# Patient Record
Sex: Female | Born: 1955
Health system: Southern US, Community
[De-identification: ages and names within clinical notes are randomized; demographics above are authoritative.]

## PROBLEM LIST (undated history)

## (undated) DIAGNOSIS — C801 Malignant (primary) neoplasm, unspecified: Secondary | ICD-10-CM

## (undated) DIAGNOSIS — F329 Major depressive disorder, single episode, unspecified: Secondary | ICD-10-CM

## (undated) DIAGNOSIS — K52839 Microscopic colitis, unspecified: Secondary | ICD-10-CM

## (undated) DIAGNOSIS — K219 Gastro-esophageal reflux disease without esophagitis: Secondary | ICD-10-CM

## (undated) DIAGNOSIS — K52832 Lymphocytic colitis: Secondary | ICD-10-CM

## (undated) DIAGNOSIS — R51 Headache: Secondary | ICD-10-CM

## (undated) DIAGNOSIS — R0600 Dyspnea, unspecified: Secondary | ICD-10-CM

## (undated) DIAGNOSIS — F419 Anxiety disorder, unspecified: Secondary | ICD-10-CM

## (undated) DIAGNOSIS — K589 Irritable bowel syndrome without diarrhea: Secondary | ICD-10-CM

## (undated) DIAGNOSIS — F4310 Post-traumatic stress disorder, unspecified: Secondary | ICD-10-CM

## (undated) DIAGNOSIS — M1990 Unspecified osteoarthritis, unspecified site: Secondary | ICD-10-CM

## (undated) DIAGNOSIS — M899 Disorder of bone, unspecified: Secondary | ICD-10-CM

## (undated) DIAGNOSIS — Z9289 Personal history of other medical treatment: Secondary | ICD-10-CM

## (undated) DIAGNOSIS — F3289 Other specified depressive episodes: Secondary | ICD-10-CM

## (undated) DIAGNOSIS — M810 Age-related osteoporosis without current pathological fracture: Secondary | ICD-10-CM

## (undated) DIAGNOSIS — J449 Chronic obstructive pulmonary disease, unspecified: Secondary | ICD-10-CM

## (undated) DIAGNOSIS — M949 Disorder of cartilage, unspecified: Secondary | ICD-10-CM

## (undated) HISTORY — DX: Irritable bowel syndrome, unspecified: K58.9

## (undated) HISTORY — DX: Disorder of cartilage, unspecified: M94.9

## (undated) HISTORY — PX: TUBAL LIGATION: SHX77

## (undated) HISTORY — DX: Age-related osteoporosis without current pathological fracture: M81.0

## (undated) HISTORY — DX: Microscopic colitis, unspecified: K52.839

## (undated) HISTORY — PX: BUNIONECTOMY: SHX129

## (undated) HISTORY — DX: Disorder of bone, unspecified: M89.9

## (undated) HISTORY — DX: Headache: R51

## (undated) HISTORY — DX: Malignant (primary) neoplasm, unspecified: C80.1

## (undated) HISTORY — DX: Other specified depressive episodes: F32.89

## (undated) HISTORY — DX: Unspecified osteoarthritis, unspecified site: M19.90

## (undated) HISTORY — DX: Lymphocytic colitis: K52.832

## (undated) HISTORY — DX: Personal history of other medical treatment: Z92.89

## (undated) HISTORY — DX: Major depressive disorder, single episode, unspecified: F32.9

---
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
## 1983-04-13 HISTORY — PX: ABDOMINAL HYSTERECTOMY: SHX81

## 1985-04-12 HISTORY — PX: BREAST BIOPSY: SHX20

## 1985-04-12 LAB — HM MAMMOGRAPHY: HM Mammogram: NORMAL

## 1988-04-12 HISTORY — PX: FOOT SURGERY: SHX648

## 2000-07-26 ENCOUNTER — Ambulatory Visit (HOSPITAL_BASED_OUTPATIENT_CLINIC_OR_DEPARTMENT_OTHER): Admission: RE | Admit: 2000-07-26 | Discharge: 2000-07-26 | Payer: Self-pay | Admitting: Orthopaedic Surgery

## 2002-08-28 ENCOUNTER — Inpatient Hospital Stay (HOSPITAL_COMMUNITY): Admission: EM | Admit: 2002-08-28 | Discharge: 2002-08-29 | Payer: Self-pay | Admitting: Emergency Medicine

## 2002-08-29 ENCOUNTER — Encounter (INDEPENDENT_AMBULATORY_CARE_PROVIDER_SITE_OTHER): Payer: Self-pay | Admitting: Specialist

## 2007-04-13 DIAGNOSIS — C801 Malignant (primary) neoplasm, unspecified: Secondary | ICD-10-CM

## 2007-04-13 HISTORY — DX: Malignant (primary) neoplasm, unspecified: C80.1

## 2008-07-16 ENCOUNTER — Ambulatory Visit: Payer: Self-pay | Admitting: Internal Medicine

## 2008-07-16 ENCOUNTER — Encounter: Payer: Self-pay | Admitting: Internal Medicine

## 2008-07-16 DIAGNOSIS — IMO0002 Reserved for concepts with insufficient information to code with codable children: Secondary | ICD-10-CM | POA: Insufficient documentation

## 2008-07-16 DIAGNOSIS — C801 Malignant (primary) neoplasm, unspecified: Secondary | ICD-10-CM | POA: Insufficient documentation

## 2008-07-16 DIAGNOSIS — R519 Headache, unspecified: Secondary | ICD-10-CM | POA: Insufficient documentation

## 2008-07-16 DIAGNOSIS — F339 Major depressive disorder, recurrent, unspecified: Secondary | ICD-10-CM | POA: Insufficient documentation

## 2008-07-16 DIAGNOSIS — M129 Arthropathy, unspecified: Secondary | ICD-10-CM | POA: Insufficient documentation

## 2008-07-16 DIAGNOSIS — R51 Headache: Secondary | ICD-10-CM | POA: Insufficient documentation

## 2008-07-17 LAB — CONVERTED CEMR LAB
Basophils Absolute: 0.2 10*3/uL — ABNORMAL HIGH (ref 0.0–0.1)
Basophils Relative: 2.6 % (ref 0.0–3.0)
Cholesterol: 193 mg/dL (ref 0–200)
Eosinophils Absolute: 0.1 10*3/uL (ref 0.0–0.7)
Eosinophils Relative: 1.2 % (ref 0.0–5.0)
HCT: 43.4 % (ref 36.0–46.0)
HDL: 59.2 mg/dL (ref >39.00)
Hemoglobin: 15 g/dL (ref 12.0–15.0)
LDL Cholesterol: 122 mg/dL — ABNORMAL HIGH (ref 0–99)
Lymphocytes Relative: 34.9 % (ref 12.0–46.0)
Lymphs Abs: 2.6 10*3/uL (ref 0.7–4.0)
MCHC: 34.5 g/dL (ref 30.0–36.0)
MCV: 94.2 fL (ref 78.0–100.0)
Monocytes Absolute: 0.4 10*3/uL (ref 0.1–1.0)
Monocytes Relative: 5.7 % (ref 3.0–12.0)
Neutro Abs: 4.2 10*3/uL (ref 1.4–7.7)
Neutrophils Relative %: 55.6 % (ref 43.0–77.0)
Platelets: 315 10*3/uL (ref 150.0–400.0)
RBC: 4.61 M/uL (ref 3.87–5.11)
RDW: 12.9 % (ref 11.5–14.6)
TSH: 1.31 microintl units/mL (ref 0.35–5.50)
Total CHOL/HDL Ratio: 3
Triglycerides: 60 mg/dL (ref 0.0–149.0)
VLDL: 12 mg/dL (ref 0.0–40.0)
WBC: 7.5 10*3/uL (ref 4.5–10.5)

## 2008-07-22 ENCOUNTER — Ambulatory Visit: Payer: Self-pay | Admitting: Internal Medicine

## 2008-07-22 LAB — CONVERTED CEMR LAB
Basophils Absolute: 0.2 10*3/uL — ABNORMAL HIGH (ref 0.0–0.1)
Basophils Relative: 2.6 % (ref 0.0–3.0)
Cholesterol: 193 mg/dL (ref 0–200)
Eosinophils Absolute: 0.1 10*3/uL (ref 0.0–0.7)
Eosinophils Relative: 1.2 % (ref 0.0–5.0)
HCT: 43.4 % (ref 36.0–46.0)
HDL: 59.2 mg/dL (ref >39.00)
Hemoglobin: 15 g/dL (ref 12.0–15.0)
LDL Cholesterol: 122 mg/dL — ABNORMAL HIGH (ref 0–99)
Lymphocytes Relative: 34.9 % (ref 12.0–46.0)
Lymphs Abs: 2.6 10*3/uL (ref 0.7–4.0)
MCHC: 34.5 g/dL (ref 30.0–36.0)
MCV: 94.2 fL (ref 78.0–100.0)
Monocytes Absolute: 0.4 10*3/uL (ref 0.1–1.0)
Monocytes Relative: 5.7 % (ref 3.0–12.0)
Neutro Abs: 4.2 10*3/uL (ref 1.4–7.7)
Neutrophils Relative %: 55.6 % (ref 43.0–77.0)
Platelets: 315 10*3/uL (ref 150.0–400.0)
RBC: 4.61 M/uL (ref 3.87–5.11)
RDW: 12.9 % (ref 11.5–14.6)
TSH: 1.31 microintl units/mL (ref 0.35–5.50)
Total CHOL/HDL Ratio: 3
Triglycerides: 60 mg/dL (ref 0.0–149.0)
VLDL: 12 mg/dL (ref 0.0–40.0)
WBC: 7.5 10*3/uL (ref 4.5–10.5)

## 2008-07-24 ENCOUNTER — Telehealth: Payer: Self-pay | Admitting: Internal Medicine

## 2008-08-05 ENCOUNTER — Encounter: Payer: Self-pay | Admitting: Internal Medicine

## 2008-09-30 ENCOUNTER — Ambulatory Visit: Payer: Self-pay | Admitting: Internal Medicine

## 2008-09-30 DIAGNOSIS — E568 Deficiency of other vitamins: Secondary | ICD-10-CM | POA: Insufficient documentation

## 2008-09-30 DIAGNOSIS — M810 Age-related osteoporosis without current pathological fracture: Secondary | ICD-10-CM | POA: Insufficient documentation

## 2008-09-30 HISTORY — DX: Age-related osteoporosis without current pathological fracture: M81.0

## 2008-10-29 ENCOUNTER — Telehealth (INDEPENDENT_AMBULATORY_CARE_PROVIDER_SITE_OTHER): Payer: Self-pay | Admitting: *Deleted

## 2008-11-20 ENCOUNTER — Ambulatory Visit: Payer: Self-pay | Admitting: Internal Medicine

## 2009-01-08 ENCOUNTER — Ambulatory Visit: Payer: Self-pay | Admitting: Internal Medicine

## 2009-01-08 DIAGNOSIS — R197 Diarrhea, unspecified: Secondary | ICD-10-CM | POA: Insufficient documentation

## 2009-01-31 ENCOUNTER — Telehealth: Payer: Self-pay | Admitting: Internal Medicine

## 2009-02-03 ENCOUNTER — Telehealth: Payer: Self-pay | Admitting: Internal Medicine

## 2009-02-19 ENCOUNTER — Ambulatory Visit: Payer: Self-pay | Admitting: Internal Medicine

## 2009-02-21 ENCOUNTER — Ambulatory Visit: Payer: Self-pay | Admitting: Gastroenterology

## 2009-02-21 LAB — CONVERTED CEMR LAB
ALT: 17 units/L (ref 0–35)
AST: 25 units/L (ref 0–37)
Albumin: 4.2 g/dL (ref 3.5–5.2)
Alkaline Phosphatase: 101 units/L (ref 39–117)
BUN: 13 mg/dL (ref 6–23)
Basophils Absolute: 0.1 10*3/uL (ref 0.0–0.1)
Basophils Relative: 1.1 % (ref 0.0–3.0)
CO2: 29 meq/L (ref 19–32)
Calcium: 9.3 mg/dL (ref 8.4–10.5)
Chloride: 100 meq/L (ref 96–112)
Creatinine, Ser: 1 mg/dL (ref 0.4–1.2)
Eosinophils Absolute: 0.1 10*3/uL (ref 0.0–0.7)
Eosinophils Relative: 1.3 % (ref 0.0–5.0)
GFR calc non Af Amer: 61.56 mL/min (ref >60)
Glucose, Bld: 100 mg/dL — ABNORMAL HIGH (ref 70–99)
HCT: 43.8 % (ref 36.0–46.0)
Hemoglobin: 14.9 g/dL (ref 12.0–15.0)
IgA: 282 mg/dL (ref 68–378)
Lymphocytes Relative: 38.5 % (ref 12.0–46.0)
Lymphs Abs: 2.7 10*3/uL (ref 0.7–4.0)
MCHC: 34.1 g/dL (ref 30.0–36.0)
MCV: 96.7 fL (ref 78.0–100.0)
Monocytes Absolute: 0.5 10*3/uL (ref 0.1–1.0)
Monocytes Relative: 6.7 % (ref 3.0–12.0)
Neutro Abs: 3.5 10*3/uL (ref 1.4–7.7)
Neutrophils Relative %: 52.4 % (ref 43.0–77.0)
Platelets: 290 10*3/uL (ref 150.0–400.0)
Potassium: 4.4 meq/L (ref 3.5–5.1)
RBC: 4.53 M/uL (ref 3.87–5.11)
RDW: 13.3 % (ref 11.5–14.6)
Sed Rate: 14 mm/hr (ref 0–22)
Sodium: 140 meq/L (ref 135–145)
TSH: 1.95 microintl units/mL (ref 0.35–5.50)
Total Bilirubin: 0.7 mg/dL (ref 0.3–1.2)
Total Protein: 7.5 g/dL (ref 6.0–8.3)
WBC: 6.9 10*3/uL (ref 4.5–10.5)

## 2009-02-24 ENCOUNTER — Ambulatory Visit: Payer: Self-pay | Admitting: Gastroenterology

## 2009-02-24 ENCOUNTER — Encounter: Payer: Self-pay | Admitting: Gastroenterology

## 2009-02-24 LAB — HM COLONOSCOPY

## 2009-02-24 LAB — CONVERTED CEMR LAB: Tissue Transglutaminase Ab, IgA: 0.7 units (ref <7)

## 2009-02-27 ENCOUNTER — Telehealth (INDEPENDENT_AMBULATORY_CARE_PROVIDER_SITE_OTHER): Payer: Self-pay | Admitting: *Deleted

## 2009-04-18 ENCOUNTER — Ambulatory Visit: Payer: Self-pay | Admitting: Gastroenterology

## 2009-06-02 ENCOUNTER — Ambulatory Visit: Payer: Self-pay | Admitting: Gastroenterology

## 2009-08-05 ENCOUNTER — Ambulatory Visit: Payer: Self-pay | Admitting: Gastroenterology

## 2009-09-01 ENCOUNTER — Telehealth: Payer: Self-pay | Admitting: Internal Medicine

## 2009-09-15 ENCOUNTER — Telehealth: Payer: Self-pay | Admitting: Internal Medicine

## 2009-09-24 ENCOUNTER — Telehealth: Payer: Self-pay | Admitting: Gastroenterology

## 2009-09-26 ENCOUNTER — Ambulatory Visit: Payer: Self-pay | Admitting: Gastroenterology

## 2009-09-26 LAB — CONVERTED CEMR LAB
ALT: 11 units/L (ref 0–35)
AST: 21 units/L (ref 0–37)
Albumin: 3.7 g/dL (ref 3.5–5.2)
Alkaline Phosphatase: 95 units/L (ref 39–117)
BUN: 8 mg/dL (ref 6–23)
Basophils Absolute: 0 10*3/uL (ref 0.0–0.1)
Basophils Relative: 0.2 % (ref 0.0–3.0)
CO2: 26 meq/L (ref 19–32)
Calcium: 9.3 mg/dL (ref 8.4–10.5)
Chloride: 105 meq/L (ref 96–112)
Creatinine, Ser: 0.8 mg/dL (ref 0.4–1.2)
Eosinophils Absolute: 0.1 10*3/uL (ref 0.0–0.7)
Eosinophils Relative: 0.6 % (ref 0.0–5.0)
GFR calc non Af Amer: 81.81 mL/min (ref >60)
Glucose, Bld: 104 mg/dL — ABNORMAL HIGH (ref 70–99)
HCT: 42.1 % (ref 36.0–46.0)
Hemoglobin: 14.6 g/dL (ref 12.0–15.0)
Lymphocytes Relative: 21 % (ref 12.0–46.0)
Lymphs Abs: 2.3 10*3/uL (ref 0.7–4.0)
MCHC: 34.7 g/dL (ref 30.0–36.0)
MCV: 94.1 fL (ref 78.0–100.0)
Monocytes Absolute: 0.7 10*3/uL (ref 0.1–1.0)
Monocytes Relative: 5.9 % (ref 3.0–12.0)
Neutro Abs: 7.9 10*3/uL — ABNORMAL HIGH (ref 1.4–7.7)
Neutrophils Relative %: 72.3 % (ref 43.0–77.0)
Platelets: 370 10*3/uL (ref 150.0–400.0)
Potassium: 3.8 meq/L (ref 3.5–5.1)
RBC: 4.47 M/uL (ref 3.87–5.11)
RDW: 14.8 % — ABNORMAL HIGH (ref 11.5–14.6)
Sodium: 139 meq/L (ref 135–145)
Total Bilirubin: 0.4 mg/dL (ref 0.3–1.2)
Total Protein: 6.6 g/dL (ref 6.0–8.3)
WBC: 11 10*3/uL — ABNORMAL HIGH (ref 4.5–10.5)

## 2009-10-01 ENCOUNTER — Ambulatory Visit: Payer: Self-pay | Admitting: Internal Medicine

## 2009-10-15 ENCOUNTER — Telehealth: Payer: Self-pay | Admitting: Internal Medicine

## 2009-10-24 ENCOUNTER — Telehealth: Payer: Self-pay | Admitting: Internal Medicine

## 2009-11-18 ENCOUNTER — Telehealth (INDEPENDENT_AMBULATORY_CARE_PROVIDER_SITE_OTHER): Payer: Self-pay | Admitting: *Deleted

## 2009-12-03 ENCOUNTER — Ambulatory Visit: Payer: Self-pay | Admitting: Internal Medicine

## 2009-12-16 ENCOUNTER — Telehealth: Payer: Self-pay | Admitting: Internal Medicine

## 2009-12-31 ENCOUNTER — Telehealth (INDEPENDENT_AMBULATORY_CARE_PROVIDER_SITE_OTHER): Payer: Self-pay

## 2010-01-15 ENCOUNTER — Telehealth (INDEPENDENT_AMBULATORY_CARE_PROVIDER_SITE_OTHER): Payer: Self-pay | Admitting: *Deleted

## 2010-01-19 ENCOUNTER — Telehealth: Payer: Self-pay | Admitting: Internal Medicine

## 2010-02-10 ENCOUNTER — Telehealth: Payer: Self-pay | Admitting: Internal Medicine

## 2010-02-26 ENCOUNTER — Telehealth: Payer: Self-pay | Admitting: Gastroenterology

## 2010-03-02 ENCOUNTER — Ambulatory Visit: Payer: Self-pay | Admitting: Gastroenterology

## 2010-03-02 ENCOUNTER — Encounter: Payer: Self-pay | Admitting: Physician Assistant

## 2010-03-02 DIAGNOSIS — R634 Abnormal weight loss: Secondary | ICD-10-CM | POA: Insufficient documentation

## 2010-03-02 DIAGNOSIS — R109 Unspecified abdominal pain: Secondary | ICD-10-CM | POA: Insufficient documentation

## 2010-03-03 LAB — CONVERTED CEMR LAB
BUN: 10 mg/dL (ref 6–23)
Basophils Absolute: 0.1 10*3/uL (ref 0.0–0.1)
Basophils Relative: 1.2 % (ref 0.0–3.0)
CO2: 28 meq/L (ref 19–32)
CRP, High Sensitivity: 3.66 (ref 0.00–5.00)
Calcium: 9.1 mg/dL (ref 8.4–10.5)
Chloride: 102 meq/L (ref 96–112)
Creatinine, Ser: 0.9 mg/dL (ref 0.4–1.2)
Eosinophils Absolute: 0 10*3/uL (ref 0.0–0.7)
Eosinophils Relative: 0.7 % (ref 0.0–5.0)
GFR calc non Af Amer: 70.14 mL/min (ref >60)
Glucose, Bld: 102 mg/dL — ABNORMAL HIGH (ref 70–99)
HCT: 44.3 % (ref 36.0–46.0)
Hemoglobin: 15.6 g/dL — ABNORMAL HIGH (ref 12.0–15.0)
Lymphocytes Relative: 29.7 % (ref 12.0–46.0)
Lymphs Abs: 2.1 10*3/uL (ref 0.7–4.0)
MCHC: 35.2 g/dL (ref 30.0–36.0)
MCV: 96.2 fL (ref 78.0–100.0)
Monocytes Absolute: 0.6 10*3/uL (ref 0.1–1.0)
Monocytes Relative: 8.5 % (ref 3.0–12.0)
Neutro Abs: 4.2 10*3/uL (ref 1.4–7.7)
Neutrophils Relative %: 59.9 % (ref 43.0–77.0)
Platelets: 324 10*3/uL (ref 150.0–400.0)
Potassium: 4.1 meq/L (ref 3.5–5.1)
RBC: 4.6 M/uL (ref 3.87–5.11)
RDW: 13.7 % (ref 11.5–14.6)
Sodium: 139 meq/L (ref 135–145)
WBC: 7.1 10*3/uL (ref 4.5–10.5)

## 2010-03-04 LAB — CONVERTED CEMR LAB: Tissue Transglutaminase Ab, IgA: 11.6 units (ref <20)

## 2010-03-10 ENCOUNTER — Telehealth: Payer: Self-pay | Admitting: Physician Assistant

## 2010-03-11 ENCOUNTER — Encounter: Payer: Self-pay | Admitting: Physician Assistant

## 2010-03-12 ENCOUNTER — Encounter: Payer: Self-pay | Admitting: Physician Assistant

## 2010-03-13 ENCOUNTER — Telehealth: Payer: Self-pay | Admitting: Internal Medicine

## 2010-04-01 ENCOUNTER — Telehealth (INDEPENDENT_AMBULATORY_CARE_PROVIDER_SITE_OTHER): Payer: Self-pay | Admitting: *Deleted

## 2010-04-08 ENCOUNTER — Telehealth: Payer: Self-pay | Admitting: Internal Medicine

## 2010-04-14 ENCOUNTER — Telehealth: Payer: Self-pay | Admitting: Internal Medicine

## 2010-04-22 ENCOUNTER — Telehealth: Payer: Self-pay | Admitting: Physician Assistant

## 2010-05-12 NOTE — Assessment & Plan Note (Signed)
Summary: PER PH NOTE 2-4 WK FU-MED EVAL-$50 PHONE STC   Vital Signs:  Patient profile:   55 year old female Menstrual status:  regular Height:      63 inches (160.02 cm) Weight:      132.12 pounds (60.05 kg) O2 Sat:      94 % Temp:     98.2 degrees F (36.78 degrees C) oral Pulse rate:   73 / minute BP sitting:   118 / 88  (left arm) Cuff size:   regular  Vitals Entered By: Orlan Leavens (November 20, 2008 10:53 AM) CC: follow-up visit Is Patient Diabetic? No Pain Assessment Patient in pain? no        Primary Care Provider:  Newt Lukes MD  CC:  follow-up visit.  History of Present Illness: c/o continued depression symptoms  taking zoloft as called in 2 weeks ago and feels some better -  but continued insomina - less than 3 h sleep night trouble falling aslepp and trouble staying asleep - wakes early and can not return to sleep less tearful/mood swings also still taking whole imipramine b/c unable to split pill in half - would like to stay on this med not in counseling - declines referral at this time no new social or family stressors in her life  Current Medications (verified): 1)  Advil 200 Mg Tabs (Ibuprofen) .... Take Prn 2)  Imipramine Hcl 50 Mg Tabs (Imipramine Hcl) .... Take 1 By Mouth Qhs 3)  Zoloft 25 Mg Tabs (Sertraline Hcl) .Marland Kitchen.. 1 Once Daily 4)  Vitamin D3 1000 Unit Caps (Cholecalciferol) .... Take 1 By Mouth Qd  Allergies (verified): 1)  ! Tetracycline PMH-FH-SH reviewed-no changes except otherwise noted  Social History: Reviewed history from 07/16/2008 and no changes required. quit smoking 2009 (35 pk yr hx) Marital Status: Married Children:  2 grown - son in [place] and dtr in [place] Occupation: homemaker Regular exercise-yes - gardening occ EtOH  Review of Systems  The patient denies anorexia, fever, weight loss, chest pain, headaches, and abdominal pain.    Physical Exam  General:  alert, well-developed, well-nourished, and  cooperative to examination.    Lungs:  normal respiratory effort, no intercostal retractions or use of accessory muscles; normal breath sounds bilaterally - no crackles and no wheezes.    Heart:  normal rate, regular rhythm, no murmur, and no rub. BLE without edema.  Psych:  Oriented X3, memory intact for recent and remote, normally interactive, fair eye contact, not anxious appearing, mildly depressed appearing, and not agitated.      Impression & Recommendations:  Problem # 1:  DEPRESSION (ICD-311)  still significant insomnia symptoms  pt reluctant to stop imipramine will increase SSRI dosing for better depression control rec OTC sleep aides to avoid starting "habit forming" med tx for insomina Her updated medication list for this problem includes:    Imipramine Hcl 50 Mg Tabs (Imipramine hcl) .Marland Kitchen... Take 1 by mouth qhs    Zoloft 50 Mg Tabs (Sertraline hcl) .Marland Kitchen... 1 by mouth once daily  Discussed treatment options, including trial of antidpressant medication. Follow-up call in 2 weeks, sooner as needed.  Patient agrees to call if any worsening of symptoms or thoughts of doing harm arise.  Verified that the patient has no suicidal ideation at this time.   Orders: Prescription Created Electronically [phone])  Complete Medication List: 1)  Advil 200 Mg Tabs (Ibuprofen) .... Take prn 2)  Imipramine Hcl 50 Mg Tabs (Imipramine hcl) .... Take 1  by mouth qhs 3)  Zoloft 50 Mg Tabs (Sertraline hcl) .Marland Kitchen.. 1 by mouth once daily 4)  Vitamin D3 1000 Unit Caps (Cholecalciferol) .... Take 1 by mouth qd  Patient Instructions: 1)  will increase Zoloft to 50mg  daily 2)  also continue Imipramine 50mg  at bedtime 3)  Try Tylenol PM or other sleep aides from the pharmacy for insominia 4)  practice "good sleep hygiene" as we discussed 5)  If you are still having trouble sleeping in 2-4 weeks, call so we can consider other medication treatment 6)  Please schedule a follow-up appointment in 3 months, sooner  if needed for problems. Prescriptions: ZOLOFT 50 MG TABS (SERTRALINE HCL) 1 by mouth once daily  #30 x 3   Entered and Authorized by:   Newt Lukes MD   Signed by:   Newt Lukes MD on 11/20/2008   Method used:   [facility] [id number]       [address]       [place], [place]  [address]       Ph: [phone] or [phone]       Fax: [phone]   RxID:   [id number]

## 2010-05-12 NOTE — Assessment & Plan Note (Signed)
Summary: 2 MTH FU--STC   Vital Signs:  Patient profile:   55 year old female Menstrual status:  regular Height:      63 inches (160.02 cm) Weight:      131.4 pounds (59.73 kg) O2 Sat:      94 % on Room air Temp:     98.2 degrees F (36.78 degrees C) oral Pulse rate:   88 / minute BP sitting:   120 / 90  (left arm) Cuff size:   regular  Vitals Entered By: Orlan Leavens RMA (December 03, 2009 10:43 AM)  O2 Flow:  Room air CC: 2 month follow-up Is Patient Diabetic? No Pain Assessment Patient in pain? no        Primary Care Provider:  Newt Lukes MD  CC:  2 month follow-up.  History of Present Illness: diarrhea - improved but not resolved - worse with emotional stress taking lomotil  which helps - but upto 10/d no abd pain - no n/v occ blood (BRBPR) mixed with stool, nonpainful dx and tx ongoing for microscopic colitis-  depression - exac by recent sudden death of spouse 09-21-09 6wk between his dx and death related to met panc cancer 100% compliance with meds but needs something different for sleep no adv SE with paxil - feels well on this - not in counseling at this time - unable to afford  arthritis - now using aleve in place of advil with good results - no swelling in joints   Current Medications (verified): 1)  Imipramine Hcl 50 Mg Tabs (Imipramine Hcl) .... Take 1 By Mouth Qhs 2)  Paroxetine Hcl 40 Mg Tabs (Paroxetine Hcl) .Marland Kitchen.. 1 By Mouth Once Daily 3)  Klonopin 0.5 Mg Tabs (Clonazepam) .... 1/4-1/2 Tab By Mouth  Every Morning As Needed For Anxiety/nerves and 1/2-1 Tab By Mouth At Bedtime As Needed 4)  Imodium A-D 2 Mg Tabs (Loperamide Hcl) .... Take 4 Tablets  Every Morning and 4 Tablets Every Evening 5)  Entocort Ec 3 Mg Xr24h-Cap (Budesonide) .... 3 Capsules By Mouth Once Daily 6)  Tylenol Extra Strength 500 Mg Tabs (Acetaminophen) .... 2 Tablets By Mouth Once Daily 7)  Diphenoxylate-Atropine 2.5-0.025 Mg Tabs (Diphenoxylate-Atropine) .... Take 1 Q 6  Hours As Needed For Diarrhea  Allergies (verified): 1)  ! Tetracycline  Past History:  Past Medical History: Depression microscopic colitis  Skin cancer - Squam cell ca  osteopenia    MD roster: GI - jacobs derm - clyde nolan  Review of Systems       The patient complains of depression.  The patient denies weight loss, chest pain, syncope, and headaches.    Physical Exam  General:  alert, well-developed, well-nourished, and cooperative to examination. very emotional Lungs:  normal respiratory effort, no intercostal retractions or use of accessory muscles; normal breath sounds bilaterally - no crackles and no wheezes.    Heart:  normal rate, regular rhythm, no murmur, and no rub. BLE without edema.  Psych:  Oriented X3, memory intact for recent and remote, normally interactive, fair eye contact, mildly anxious appearing, mod depressed appearing with tearfulness during hx/exam,  not agitated.      Impression & Recommendations:  Problem # 1:  DEPRESSION (ICD-311)  Her updated medication list for this problem includes:    Imipramine Hcl 50 Mg Tabs (Imipramine hcl) .Marland Kitchen... Take 1 by mouth qhs    Paroxetine Hcl 40 Mg Tabs (Paroxetine hcl) .Marland Kitchen... 1 by mouth once daily    Alprazolam 0.5  Mg Tabs (Alprazolam) .Marland Kitchen... 1 by mouth at bedtime as needed    Trazodone Hcl 50 Mg Tabs (Trazodone hcl) .Marland Kitchen... 1/2-1 by mouth at bedtime  long hx same - prev followed with dr. Renato Gails - now exac by acute grief rxn - 10/01/09: inc paxil, changed xanax to klonopin and referred to behav health (but pt did not go) - i feel still needs counseling and MD eval/tx but pt reports unable to do so-- add trazodone now to ongoing tx and change BZ back to xanax at pt pref Time spent with patient 30 minutes, more than 50% of this time was spent counseling patient on depression and her grief  Orders: Prescription Created Electronically [phone])  Complete Medication List: 1)  Imipramine Hcl 50 Mg Tabs (Imipramine hcl)  .... Take 1 by mouth qhs 2)  Paroxetine Hcl 40 Mg Tabs (Paroxetine hcl) .Marland Kitchen.. 1 by mouth once daily 3)  Alprazolam 0.5 Mg Tabs (Alprazolam) .Marland Kitchen.. 1 by mouth at bedtime as needed 4)  Imodium A-d 2 Mg Tabs (Loperamide hcl) .... Take 4 tablets  every morning and 4 tablets every evening 5)  Entocort Ec 3 Mg Xr24h-cap (Budesonide) .... 3 capsules by mouth once daily 6)  Tylenol Extra Strength 500 Mg Tabs (Acetaminophen) .... 2 tablets by mouth once daily 7)  Diphenoxylate-atropine 2.5-0.025 Mg Tabs (Diphenoxylate-atropine) .... Take 1 q 6 hours as needed for diarrhea 8)  Trazodone Hcl 50 Mg Tabs (Trazodone hcl) .... 1/2-1 by mouth at bedtime  Patient Instructions: 1)  it was good to see you today. 2)  continue same Paxil and imipramine dose 3)  change back to xanax  4)  also start trazodone as discussed for sleep and depression - your prescription has been electronically submitted to your pharmacy. Please take as directed. Contact our office if you believe you're having problems with the medication(s).  5)  please call behavioral health [phone]) or dr.plovsky ([phone]) or dr. Jennelle Human [phone]) to get a psychiatry appointment with an MD  6)  followup with dr. Christella Hartigan as planned 7)  Please schedule a follow-up appointment in 3-4 months, sooner if problems. Prescriptions: TRAZODONE HCL 50 MG TABS (TRAZODONE HCL) 1/2-1 by mouth at bedtime  #30 x 6   Entered and Authorized by:   Newt Lukes MD   Signed by:   Newt Lukes MD on 12/03/2009   Method used:   [id number]       [address]       [place], [place]  [address]       Ph: [phone] or [phone]       Fax: [phone]   RxID:   [id number] ALPRAZOLAM 0.5 MG TABS (ALPRAZOLAM) 1 by mouth at bedtime as needed  #30 x 3   Entered and Authorized by:   Newt Lukes MD   Signed by:   Newt Lukes MD on 12/03/2009   Method used:   Print then  Give to Patient   RxID:   [id number]

## 2010-05-12 NOTE — Progress Notes (Signed)
Summary: Req a Call from Dr  Phone Note Call from Patient Call back at Home Phone [phone]   Summary of Call: Patient is requesting a call from Dr. Are you willing/able to do this? Please advise Initial call taken by: Lamar Sprinkles,  July 24, 2008 1:12 PM  Follow-up for Phone Call        please inquire into the nature of the call/question - i will then either call or have staff call back with the information/answers - thanks Follow-up by: Newt Lukes MD,  July 24, 2008 4:42 PM  Additional Follow-up for Phone Call Additional follow up Details #1::        Pt went to gyn, they told her that she had a pap on 4/6 and it was normal and that she did not need another one. Pt argued w/GYN and eventually they did pap. They did not attempt to call office to confirm as they should have. Pt wanted to make sure that it was not billed. I told her it was not and the documentation was in error. She was somewhat upset b/c the other problem was that we had gotten her dob wrong originally. This caused problems at her pharmacy. I have sent in rx to a different pharm. She has not been to a dr in years and w/2 errors in first visit she was nervous about returning here. I assured her that the dob was corrected and I would clear up problem with GYN.  Additional Follow-up by: Lamar Sprinkles,  July 24, 2008 6:23 PM    Additional Follow-up for Phone Call Additional follow up Details #2::    I agree with everything you described - the initial documentation re: PAP done here was noted immediately and corrected as NOT done 4/6 (EMR glitch - Savanah f/ EMR was here with me when that occured) . Also we immediately recognized and corrected the DOB issue while she was here in the office. she certainly was not charged for a PAP here. Thanks. Follow-up by: Newt Lukes MD,  July 25, 2008 8:30 AM    Prescriptions: IMIPRAMINE HCL 25 MG TAB (IMIPRAMINE HCL) Take 1 tablet by mouth at bedtime  #30 x 2  Entered by:   Lamar Sprinkles   Authorized by:   Newt Lukes MD   Signed by:   Lamar Sprinkles on 07/24/2008   Method used:   [id number]       [address]       [place], [place]  [address]       Ph: [phone] or [phone]       Fax: [phone]   RxID:   [id number]

## 2010-05-12 NOTE — Assessment & Plan Note (Signed)
Summary: colitis   History of Present Illness Primary GI MD: Rob Bunting MD Primary Provider: Newt Lukes MD Requesting Provider: n/a Chief Complaint: Intense abd cramping and watery loose diarrhea. Pt states up to 6-7 BM's daily. Pt states the abd cramping is getting more and more intense and lasts a lot longer that last several weeks.  History of Present Illness:   Monique Swanson 55 YO FEMALE KNOWN TP DR. Christella Hartigan WITH A DX OF MICROSCOPIC COLITIS. SHE HAD COLONOSCOPY WITH BX'S 03/2009. SHE HAS BEEN TREATED WITH ENTOCORT WITH IMPROVEMENT IN THE PAST.  SHE COMES IN TODAY C/O WORSENING SXS OVER THE PAST 2 WEEKS-ON FURTHER DISCUSSION SHE HAS REALLY NOT BEEN WELL SINCE HER DX. SHE SAYS SHE HAS NOT BEEN TAKING THE ENTOCORT REGULARLY. SHE HAD BEEN USING IT FOR 3-4 DAYS AT A TIME WHEN HER SXS GOT BAD. SHE FEELS IT MAKES HER FATIGUED AND  MORE BLOATED. HOWEVER  SHE FEELS FATIGUED ALL THE TIME , AND SOMEWHAT BLOATED. SHE IS HAVING 6-7 WATERY STOOLS PER DAY-SOME DAYS DESPITE TAKING BOTH IMMODIUM AND LOMOTIL. NO OBVIOUS BLEEDING. NO FEVERS. SHE IS HAVING INCREASED CRAMPING. WEIGHT IS DOWN 10 POUNDS OVER THE PAST COUPLE MONTHS, AND SHE HAS NO APPETITE.  NO RECENT ABX, OR NEW MEDS. SHE DOES ADMIT TO BEING DEPRESSED, AND IS TEARFUL WHEN TALKING ABOUT HER HUSBANDS DEATH. SHE IS BEING TREATED FOR HER DEPRESSION  BUT HAS NOT HAD ANY GRIEF COUNSELLING.   GI Review of Systems    Reports abdominal pain, bloating, loss of appetite, and  weight loss.     Location of  Abdominal pain: generalized. Weight loss of 10 pounds over 2 months.   Denies acid reflux, belching, chest pain, dysphagia with liquids, dysphagia with solids, heartburn, nausea, vomiting, vomiting blood, and  weight gain.      Reports diarrhea and  rectal pain.     Denies anal fissure, black tarry stools, change in bowel habit, constipation, diverticulosis, fecal incontinence, heme positive stool, hemorrhoids, irritable bowel syndrome, jaundice, light  color stool, liver problems, and  rectal bleeding.    Current Medications (verified): 1)  Imipramine Hcl 50 Mg Tabs (Imipramine Hcl) .... Take 1 By Mouth Qhs 2)  Paroxetine Hcl 40 Mg Tabs (Paroxetine Hcl) .Marland Kitchen.. 1 By Mouth Once Daily 3)  Alprazolam 0.5 Mg Tabs (Alprazolam) .Marland Kitchen.. 1 By Mouth At Bedtime As Needed 4)  Imodium A-D 2 Mg Tabs (Loperamide Hcl) .... Take 4 Tablets  Every Morning and 4 Tablets Every Evening 5)  Entocort Ec 3 Mg Xr24h-Cap (Budesonide) .... 3 Capsules By Mouth Once Daily 6)  Tylenol Extra Strength 500 Mg Tabs (Acetaminophen) .... 2 Tablets By Mouth Once Daily 7)  Diphenoxylate-Atropine 2.5-0.025 Mg Tabs (Diphenoxylate-Atropine) .... Take 1 Q 6 Hours As Needed For Diarrhea 8)  Trazodone Hcl 50 Mg Tabs (Trazodone Hcl) .... 1/2-1 By Mouth At Bedtime 9)  Clonazepam 0.5 Mg Tabs (Clonazepam) .... 1/4 in The Morning and 1 Tablets By Mouth Every Evening  Allergies (verified): 1)  ! Tetracycline  Past History:  Past Medical History: Reviewed history from 12/03/2009 and no changes required. Depression microscopic colitis  Skin cancer - Squam cell ca  osteopenia    MD roster: GI - jacobs derm - clyde nolan  Past Surgical History: Reviewed history from 02/21/2009 and no changes required. Appendectomy (1969) Hysterectomy (1985) Breast biopsy (1610) Surgery on both feet for joints in toes (1990)   Family History: Reviewed history from 10/01/2009 and no changes required. mom deceased age 39 Jun 11, 2022) -  CAD, dementia dad deceased age 93 - asthma sister with breast cancer   Social History: Reviewed history from 10/01/2009 and no changes required. quit smoking 2009 (35 pk yr hx) widowed 08/24/2009 -  Children:  2 grown - son in [place] and dtr in [place] Occupation: homemaker Regular exercise-yes - gardening occ EtOH 2-4 cups of coffee a day  Review of Systems  The patient denies allergy/sinus, anemia, anxiety-new, arthritis/joint pain, back pain, blood in urine,  breast changes/lumps, change in vision, confusion, cough, coughing up blood, depression-new, fainting, fatigue, fever, headaches-new, hearing problems, heart murmur, heart rhythm changes, itching, menstrual pain, muscle pains/cramps, night sweats, nosebleeds, pregnancy symptoms, shortness of breath, skin rash, sleeping problems, sore throat, swelling of feet/legs, swollen lymph glands, thirst - excessive , urination - excessive , urination changes/pain, urine leakage, vision changes, and voice change.         SEE HPI  Vital Signs:  Patient profile:   55 year old female Menstrual status:  regular Height:      63 inches Weight:      126.50 pounds BMI:     22.49 Pulse rate:   90 / minute Pulse rhythm:   regular BP sitting:   124 / 72  (left arm) Cuff size:   regular  Vitals Entered By: Christie Nottingham CMA Duncan Dull) (March 02, 2010 1:17 PM)  Physical Exam  General:  Well developed, , no acute distress.,THIN Head:  Normocephalic and atraumatic. Eyes:  PERRLA, no icterus. Lungs:  Clear throughout to auscultation. Heart:  Regular rate and rhythm; no murmurs, rubs,  or bruits. Abdomen:  SOFT, MILD TENDERNESS LOWER ABDOMEN, NONFOCAL ,NO GUARDING, BS+ Rectal:  HEME NEGATIVE ,LOOSE BROWN Extremities:  No clubbing, cyanosis, edema or deformities noted. Neurologic:  Alert and  oriented x4;  grossly normal neurologically. Psych:  depressed affect and anxious.     Impression & Recommendations:  Problem # 1:  MICROSCOPIC COLITIS 558.9 Assessment Deteriorated 55 YO FEMALE WITH KNOWN MICROSCOPIC COLITIS WITH WORSENING DIARRHEA AND ABDOMINAL CRAMPING. SXS CONSISTENT WITH EXACERBATION OF COLITIS. PT HAS NOT BEEN ON ANY MAINTANENCE  REGIMEN.  CONTINUE ENTOCORT 9 MG DAILY WHICH SHE JUST RESTARTED 4 DAYS AGO. EMPHASIZED IMPORTANCE OF TAKING MED DAILY, AND ADVISED IT MAY TAKE  A COUPLE WEEKS TO IMPROVE SIGNIFICANTLY.  CONTINUE LOMOTIL  UP TO 8/DAY ADD BENTYL 10 MG 3-4 TIMES DAILY FOR CRAMPING LABS  AS BELOW-SHE HAS HAD CELIAC TESTING IN THE PAST STOOL STUDIES TO R/O SUPERIMPOSED INFECTIOUS PROCESS. PT TO CALL IN ONE WEEK WITH PROGRESS. LOW RESIDUE DIET DISCUSSED ROLE OF STRESS -DISCUSSED GRIEF COUNSELLING THRU HOSPICE.  Problem # 2:  WEIGHT LOSS-ABNORMAL (ICD-783.21) Assessment: Comment Only SECONDARY TO ABOVE  Problem # 3:  DEPRESSION (ICD-311) Assessment: Comment Only  Other Orders: TLB-BMP (Basic Metabolic Panel-BMET) (80048-METABOL) TLB-CRP-High Sensitivity (C-Reactive Protein) (86140-FCRP) TLB-CBC Platelet - w/Differential (85025-CBCD) T-Culture, Stool (87045/87046-70140) T-PCR (16109) T-Sprue Panel (Celiac Disease Aby Eval) (83516x3/[address]) T-Fecal WBC (60454-09811)  Patient Instructions: 1)  Please go to lab, basement level. 2)  Stay on Entocort 9 MG daily. 3)  Continue the Lomotil. 4)  We have given you a low residue ( roughage diet).  5)  Copy sent to : Otho Najjar, MD 6)  The medication list was reviewed and reconciled.  All changed / newly prescribed medications were explained.  A complete medication list was provided to the patient / caregiver. Prescriptions: DICYCLOMINE HCL 10 MG CAPS (DICYCLOMINE HCL) Take 1 tab 4 times daily for cramping  #120 x 0  Entered by:   Lowry Ram NCMA   Authorized by:   Sammuel Cooper PA-c   Signed by:   Lowry Ram NCMA on 03/02/2010   Method used:   [id number]       [address]       [place], [place]  [address]       Ph: [phone] or [phone]       Fax: [phone]   RxID:   [id number]

## 2010-05-12 NOTE — Assessment & Plan Note (Signed)
Review of gastrointestinal problems: 1. Lymphocytic, microscopic colitis. Diarrhea for 10 years, eventually colonoscopy December, 2010 by Dr. Ardis Hughs. Essentially normal macroscopically however biopsies showed lymphocytic colitis. Budesonide was started With good improvement, added Imodium As well.  February, 2011: taking two Entocort pills a day, for Imodium pills a day with great results.  April, 2011 Off Entocort, takes 4-6 Imodium a day with great results.   History of Present Illness Primary GI MD: Owens Loffler MD Primary Provider: Rowe Clack MD Requesting Provider: n/a Chief Complaint: Ongoing diarrhea with 5-6 BM's qd with no blood. History of Present Illness:     shortly after her last visit she began using more and more immodium...eventually restarted the entocort.  Really noticed no improvement.    Has been down to once daily caffine, zero alcohol.  Has increased her xanax to full pill a day.  Her husband passed away from met pancreatic cancer, was not having diarrhea at that time.  she is going 5-6 times a day, up at night as well.  No recent abx.  she had a CBC and a basic metabolic profile before this visit and the only significant abnormality was a slightly elevated white blood cell count at 11,000.           Current Medications (verified): 1)  Imipramine Hcl 50 Mg Tabs (Imipramine Hcl) .... Take 1 By Mouth Qhs 2)  Paxil 20 Mg Tabs (Paroxetine Hcl) .Marland Kitchen.. 1 By Mouth Once Daily 3)  Alprazolam 0.5 Mg Tabs (Alprazolam) .... 1/2-1 Tab By Mouth At Bedtime As Needed For Sleep 4)  Imodium A-D 2 Mg Tabs (Loperamide Hcl) .... Take 2 Tablets  Every Morning and 2 Tablets Every Evening 5)  Cholestyramine 4 Gm/dose Powd (Cholestyramine) .Marland Kitchen.. 1 Dose Every Morning 6)  Entocort Ec 3 Mg Xr24h-Cap (Budesonide) .... 3 Capsules By Mouth Once Daily 7)  Tylenol Extra Strength 500 Mg Tabs (Acetaminophen) .... 2 Tablets By Mouth Once Daily  Allergies (verified): 1)  !  Tetracycline  Vital Signs:  Patient profile:   55 year old female Menstrual status:  regular Height:      63 inches Weight:      129 pounds BMI:     22.93 Pulse rate:   100 / minute Pulse rhythm:   regular BP sitting:   128 / 74  (left arm) Cuff size:   regular  Vitals Entered By: Marlon Pel CMA Deborra Medina) (September 26, 2009 1:23 PM)  Physical Exam  Additional Exam:  Constitutional: generally well appearing Psychiatric: alert and oriented times 3 Abdomen: soft, non-tender, non-distended, normal bowel sounds    Impression & Recommendations:  Problem # 1:  diarrhea this is likely simply a flare of her lymphocytic colitis. She says it is worse than ever however and she has been in and out of the hospital a lot recently with her husband's illness. Perhaps she has picked up Clostridium difficile. I will treat her empirically with Flagyl 250 mg pills, 3 pills a day for 10 days. I have also instructed her to increase her cholestyramine 2 to doses per day. She will return to see me in one months time, she will call next week.  Patient Instructions: 1)  Increase the cholestyramine; you will take 2-4gram powders a day. 2)  Continue on 8 immodium a day. 3)  Continue on 3 entocort a day. 4)  Start flagyl 250 (antibiotics) mg pills, take one pill 3 times a day (can cause nausea). 5)  Call Dr. Ardis Hughs office in  5-6 days to report on symtoms. 6)  Get name, address, fax information for disability forms...your chronic dirrhea from microscopic colitis is keeping you from functioning normally. 7)  Return to see Dr. Ardis Hughs in 4 weeks. 8)  The medication list was reviewed and reconciled.  All changed / newly prescribed medications were explained.  A complete medication list was provided to the patient / caregiver. Prescriptions: METRONIDAZOLE 250 MG  TABS (METRONIDAZOLE) Take 1 pill three times a day for 10 days  #30 x 0   Entered and Authorized by:   Milus Banister MD   Signed by:   Milus Banister  MD on 09/26/2009   Method used:   Electronically to        [id number] (retail)       [address]       [place], [place]  [address]       Ph: [phone] or [phone]       Fax: [phone]   RxID:   [id number]

## 2010-05-12 NOTE — Progress Notes (Signed)
  Phone Note Other Incoming   Request: Send information Summary of Call: Request for records received from [employer]. Forwarded to [employer].     Appended Document:  Request for records received from [employer]. Request forwarded to [employer].

## 2010-05-12 NOTE — Consult Note (Signed)
Summary: Dyspareunia/[facility]  Dyspareunia/[facility]   Imported By: Sherian Rein 07/25/2008 14:59:17  _____________________________________________________________________  External Attachment:    Type:   Image     Comment:   External Document

## 2010-05-12 NOTE — Progress Notes (Signed)
Summary: klonopin  Phone Note Refill Request Message from:  Fax from Pharmacy on October 24, 2009 9:55 AM  Refills Requested: Medication #1:  KLONOPIN 0.5 MG TABS 1/4-1/2 tab by mouth  every morning as needed for anxiety/nerves and 1/2-1 tab by mouth at bedtime as needed 90day  Medication #2:  Alprazolam 0.5mg  [facility] [phone]  Initial call taken by: Orlan Leavens,  October 24, 2009 9:56 AM  Follow-up for Phone Call        Recieved fax from [facility] req 90 day on alprazolam & Klonopin. Faxed back Denied for alprazolam. Was change to clonazepam 10/01/09. Will forward to Dr. Felicity Coyer to ok 90 supply on her Klonopin.  Follow-up by: Orlan Leavens,  October 24, 2009 9:57 AM  Additional Follow-up for Phone Call Additional follow up Details #1::        ok to give #90 klonopin, no refill - thanks Additional Follow-up by: Newt Lukes MD,  October 24, 2009 12:34 PM    Additional Follow-up for Phone Call Additional follow up Details #2::    Faxed paper request back ok # 90 only on Klonopin Follow-up by: Orlan Leavens,  October 24, 2009 12:49 PM  Prescriptions: KLONOPIN 0.5 MG TABS (CLONAZEPAM) 1/4-1/2 tab by mouth  every morning as needed for anxiety/nerves and 1/2-1 tab by mouth at bedtime as needed  #90 x 0   Entered by:   Orlan Leavens   Authorized by:   Newt Lukes MD   Signed by:   Orlan Leavens on 10/24/2009   Method used:   Historical   RxID:   [id number]

## 2010-05-12 NOTE — Progress Notes (Signed)
Summary: lomotil  Phone Note Refill Request Message from:  Fax from Pharmacy on March 13, 2010 10:35 AM  Refills Requested: Medication #1:  DIPHENOXYLATE-ATROPINE 2.5-0.025 MG TABS take 1 q 6 hours as needed for diarrhea   Last Ref[facility] [phone] Last ov 12/03/09 Is this ok to refill?  Next Appointment Scheduled: none Initial call taken by: Orlan Leavens RMA,  March 13, 2010 10:36 AM  Follow-up for Phone Call        ok to fill as prev rx'd Follow-up by: Newt Lukes MD,  March 13, 2010 10:48 AM  Additional Follow-up for Phone Call Additional follow up Details #1::        [facility] Updated EMR Additional Follow-up by: Orlan Leavens RMA,  March 13, 2010 11:45 AM    Prescriptions: DIPHENOXYLATE-ATROPINE 2.5-0.025 MG TABS (DIPHENOXYLATE-ATROPINE) take 1 q 6 hours as needed for diarrhea  #30 x 0   Entered by:   Orlan Leavens RMA   Authorized by:   Newt Lukes MD   Signed by:   Orlan Leavens RMA on 03/13/2010   Method used:   Historical   RxID:   [id number]

## 2010-05-12 NOTE — Progress Notes (Signed)
Summary: REFILL  Phone Note Call from Patient Call back at [facility] Phone [phone]   Summary of Call: Pt left vm: She says pharm has been requesting refill of medication x 4 days with no response. She did not give name of med. I do not see any requests in EMR. Last med filled was imipramine on Friday.  Initial call taken by: Lamar Sprinkles, CMA,  February 03, 2009 11:28 AM  Follow-up for Phone Call        pharmacy error. pt was calling about 1 refill request which was the imipramine that was refilled. pharamcy contacted pt after pt called [facility] Follow-up by: Margaret Pyle, CMA,  February 03, 2009 11:38 AM

## 2010-05-12 NOTE — Progress Notes (Signed)
Summary: lomotil  Phone Note Refill Request Message from:  Fax from Pharmacy on February 10, 2010 1:17 PM  Refills Requested: Medication #1:  DIPHENOXYLATE-ATROPINE 2.5-0.025 MG TABS take 1 q 6 hours as needed for diarrhea   Last Refilled: [facility] [address] [phone] Is this ok to refill?  Next Appointment Scheduled: none Initial call taken by: Orlan Leavens RMA,  February 10, 2010 1:17 PM  Follow-up for Phone Call        yes - ok to fill as prev rx'd - prior EMR records reviewed including monthly refills - note pt w/o GI f/u yet despite rec to do so last OV 11/2009 -- will review further next OV Follow-up by: Newt Lukes MD,  February 10, 2010 3:37 PM  Additional Follow-up for Phone Call Additional follow up Details #1::        Faxed back paper req ok # 30 only. Updated EMR Additional Follow-up by: Orlan Leavens RMA,  February 10, 2010 4:20 PM    Prescriptions: DIPHENOXYLATE-ATROPINE 2.5-0.025 MG TABS (DIPHENOXYLATE-ATROPINE) take 1 q 6 hours as needed for diarrhea  #30 x 0   Entered by:   Orlan Leavens RMA   Authorized by:   Newt Lukes MD   Signed by:   Orlan Leavens RMA on 02/10/2010   Method used:   Historical   RxID:   [id number]

## 2010-05-12 NOTE — Progress Notes (Signed)
Summary: alprazolam  Phone Note Refill Request Message from:  Fax from Pharmacy on Sep 01, 2009 10:50 AM  Refills Requested: Medication #1:  ALPRAZOLAM 0.5 MG TABS 1/2-1 tab by mouth at bedtime as needed for sleep # 10   Last Ref[facility]: [facility] [address] [phone] Last ov 02/19/09  Next Appointment Scheduled: none Initial call taken by: Orlan Leavens,  Sep 01, 2009 10:50 AM  Follow-up for Phone Call        is this ok to refill? Follow-up by: Orlan Leavens,  Sep 01, 2009 10:53 AM  Additional Follow-up for Phone Call Additional follow up Details #1::        yes -  Additional Follow-up by: Newt Lukes MD,  Sep 01, 2009 11:02 AM    Additional Follow-up for Phone Call Additional follow up Details #2::    Updated EMR faxed paper request back ok # 10 with 1 addtional refill Follow-up by: Orlan Leavens,  Sep 01, 2009 11:18 AM  Prescriptions: ALPRAZOLAM 0.5 MG TABS (ALPRAZOLAM) 1/2-1 tab by mouth at bedtime as needed for sleep  #10 x 1   Entered by:   Orlan Leavens   Authorized by:   Newt Lukes MD   Signed by:   Orlan Leavens on 09/01/2009   Method used:   Telephoned to ...       [facility]  [address] [id number]       [address]       [place], [place]  [address]       Ph: [phone] or [phone]       Fax: [phone]   RxID:   [id number]

## 2010-05-12 NOTE — Progress Notes (Signed)
Summary: Medicine not working  Phone Note Call from Patient Call back at [facility] [phone]   Call For: DR Wai Minotti Reason for Call: Talk to Nurse Summary of Call: Medicine she is taking is not working. Her husband passed away and she was upset on the phone. Initial call taken by: Irwin Brakeman [facility],  September 24, 2009 12:12 PM  Follow-up for Phone Call        pt continues to have diarrhea , she is taking 8 immodium a day and  3 Entocort daily with no relief.  Her husband passed away 1 month  ago.  What else can she try? Follow-up by: Christian Mate CMA Deborra Medina),  September 24, 2009 1:44 PM  Additional Follow-up for Phone Call Additional follow up Details #1::        please call her in cholestyramine 4gram powder, take one dose every morning.  Disp one month and 3 refills.    rov with me this Friday, looks like I have a spot open in afternoon.  needs cbc, bmet tomorrow Additional Follow-up by: Milus Banister MD,  September 24, 2009 3:03 PM    Additional Follow-up for Phone Call Additional follow up Details #2::    pt aware med sent and labs in [place] as well as ROV Follow-up by: Christian Mate CMA Deborra Medina),  September 24, 2009 3:36 PM  New/Updated Medications: CHOLESTYRAMINE 4 GM/DOSE POWD (CHOLESTYRAMINE) 1 dose every morning Prescriptions: CHOLESTYRAMINE 4 GM/DOSE POWD (CHOLESTYRAMINE) 1 dose every morning  #1 month x 3   Entered by:   Christian Mate CMA ([place])   Authorized by:   Milus Banister MD   Signed by:   Christian Mate CMA ([place]) on 09/24/2009   Method used:   Electronically to        [id number] (retail)       [address]       [place], [place]  [address]       Ph: [phone] or [phone]       Fax: [phone]   RxID:   [id number]

## 2010-05-12 NOTE — Progress Notes (Signed)
Summary: Questions  Phone Note Call from Patient Call back at Home Phone [phone]   Caller: Patient Call For: Mike Gip Reason for Call: Talk to Nurse Summary of Call: Pt wants to disucss her symptoms and diagnosis with nurse Initial call taken by: Raechel Chute,  March 10, 2010 2:44 PM  Follow-up for Phone Call        The pt wanted [place] to know that she is starting to feel better regarding the abd pain and spasms and the Dicyclomine is helping but she still has some pain.  I told her to give it some more time and be sure to take the medication.  She asked about wanting [place] to test her for celiac and I told her I would speak to Loreen Bankson about the testing. ( Alycia Cooperwood wanted me to cancel it but the results were already in EMR. )  The pt spoke to someone she knew that has this kind of colitis and she told the pt about celiac testing.   Follow-up by: Joselyn Glassman,  March 10, 2010 4:41 PM  Additional Follow-up for Phone Call Additional follow up Details #1::        PLEASE LET HER KNOW SHE HAS BEEN TESTED FOR CELIAC AND THE MARKERS ARE NEGATIVE Additional Follow-up by: Peterson Ao,  March 12, 2010 1:43 PM

## 2010-05-12 NOTE — Progress Notes (Signed)
Summary: Colitis flare up  Phone Note Call from Patient Call back at Home Phone [phone]   Call For: Dr Ardis Hughs Summary of Call: Having a flare up with Colitis. Unable to wait until next  available 04-15-09. Initial call taken by: Irwin Brakeman [facility],  February 26, 2010 11:36 AM  Follow-up for Phone Call        Pt. says last flare of colitis never cleared up completely.Has had an average of about 3 loose stools/day but has at times gone up to about10/day. with cramping in lower abd.  prior to and during bowel movement but then it resolves.. Pain level had been around 3 but now goes up to about a 5.Offered appt. with N.P. for tomorrow but she wanted to wait until Monday due to urgency with stool.She will call Patty in a.m. if she wants appt. chgd. to tomorrow.  Follow-up by: Abel Presto RN,  February 26, 2010 1:15 PM

## 2010-05-12 NOTE — Assessment & Plan Note (Signed)
  Review of gastrointestinal problems: 1. Lymphocytic, microscopic colitis. Diarrhea for 10 years, eventually colonoscopy December, 2010 by Dr. Ardis Hughs. Essentially normal macroscopically however biopsies showed lymphocytic colitis. Budesonide was started With good improvement, added Imodium As well.  February, 2011: taking two Entocort pills a day, for Imodium pills a day with great results.  April, 2011 Off Entocort, takes 4-6 Imodium a day with great results.    History of Present Illness Visit Type: Follow-up Visit Primary GI MD: Owens Loffler MD Primary Provider: Rowe Clack MD Requesting Provider: n/a Chief Complaint: F/u visit  History of Present Illness:     very pleasant 55 year old woman whom I last saw 6-8 weeks ago.  her husband was recently diagnosed with metastatic pancreatic cancer (spread to liver, small intestine).  He is seeing Dr. Jamse Arn.  She stopped taking entocort 6 weeks ago, she takes 5-6 immodium a day and on this regimine she is perfectly fine.             Current Medications (verified): 1)  Imipramine Hcl 50 Mg Tabs (Imipramine Hcl) .... Take 1 By Mouth Qhs 2)  Paxil 20 Mg Tabs (Paroxetine Hcl) .Marland Kitchen.. 1 By Mouth Once Daily 3)  Aleve 220 Mg Tabs (Naproxen Sodium) .... Use Prn 4)  Alprazolam 0.5 Mg Tabs (Alprazolam) .... 1/2-1 Tab By Mouth At Bedtime As Needed For Sleep 5)  Imodium A-D 2 Mg Tabs (Loperamide Hcl) .... Take 2 Tablets  Every Morning and 2 Tablets Every Evening  Allergies (verified): 1)  ! Tetracycline  Vital Signs:  Patient profile:   55 year old female Menstrual status:  regular Height:      63 inches Weight:      133 pounds BMI:     23.65 BSA:     1.63 Pulse rate:   92 / minute Pulse rhythm:   regular BP sitting:   110 / 64  (left arm) Cuff size:   regular  Vitals Entered By: Hope Pigeon [place] (August 05, 2009 10:32 AM)  Physical Exam  Additional Exam:  Constitutional: generally well appearing Psychiatric: alert and  oriented times 3 Abdomen: soft, non-tender, non-distended, normal bowel sounds    Impression & Recommendations:  Problem # 1:  lymphocytic colitis she is doing well on Imodium only. She knows to call if she starts requiring more and more Imodium.  Her symptoms responded very well to Entocort and she can only go back on that if needed. She and her husband are dealing with his new diagnosis of metastatic pancreatic cancer which will obviously be a huge burden on her and her family for some time. She does call if she has any questions or concerns.  Patient Instructions: 1)  Call Dr. Ardis Hughs with any worsening of your diarrhea. 2)  The medication list was reviewed and reconciled.  All changed / newly prescribed medications were explained.  A complete medication list was provided to the patient / caregiver.

## 2010-05-12 NOTE — Assessment & Plan Note (Signed)
Summary: 2 MTH FU $50 STC   Vital Signs:  Patient profile:   55 year old female Menstrual status:  regular Height:      63 inches (160.02 cm) Weight:      132.0 pounds (60.00 kg) O2 Sat:      98 % Temp:     97.7 degrees F (36.50 degrees C) oral Pulse rate:   84 / minute BP sitting:   112 / 84  (left arm) Cuff size:   regular  Vitals Entered By: Orlan Leavens (September 30, 2008 10:37 AM) CC: 2 month follow-up/ also pt want to discuss increasing imipramine Is Patient Diabetic? No Pain Assessment Patient in pain? no        Primary Care Provider:  Newt Lukes MD  CC:  2 month follow-up/ also pt want to discuss increasing imipramine.  History of Present Illness: dyspareunia. Had GYN evaluation 6 weeks ago with [facility]. unable to afford estrogen cream - so not taking - therefore no change in symptoms -continued vaginal dryness, pain with intercourse. question if there is alternative treatment for same  depression - still symptoms: down feeling, no energy - does feel IBS symptoms are improved with current tx - unable to sleep - takes 2 tylenol pm without improvemnt - self trial doubling imptamine with some imporvemnt  osteopenia - bone scan done thru gyn visit, resilts reviewed - now taking vit d and will reck next mo there  Current Medications (verified): 1)  Tylenol Extra Strength 500 Mg Tabs (Acetaminophen) .... Take Prn 2)  Advil 200 Mg Tabs (Ibuprofen) .... Take Prn 3)  Imipramine Hcl 25 Mg Tab (Imipramine Hcl) .... Take 1 Tablet By Mouth At Bedtime 4)  Vitamin D (Ergocalciferol) 50000 Unit Caps (Ergocalciferol) .... Take 1 By Mouth Q Week For 1 Month  Allergies (verified): 1)  ! Tetracycline  Past History:  Past Medical History: Depression IBS - diarrhea predom Skin cancer - Squam cell ca - Dr. Max Fickle osteopenia  Review of Systems       The patient complains of depression.  The patient denies fever, weight loss, weight gain, chest pain, headaches,  abdominal pain, severe indigestion/heartburn, and difficulty walking.    Physical Exam  General:  alert, well-developed, well-nourished, and cooperative to examination.    Lungs:  normal respiratory effort, no intercostal retractions or use of accessory muscles; normal breath sounds bilaterally - no crackles and no wheezes.    Heart:  normal rate, regular rhythm, no murmur, and no rub. BLE without edema.  Psych:  Oriented X3, memory intact for recent and remote, normally interactive, fair eye contact, not anxious appearing, mildly depressed appearing, and not agitated.      Impression & Recommendations:  Problem # 1:  DEPRESSION (ICD-311) symptoms uncontrolled with poor sleep, and feeling sadness as described denies suicidal or homicidal ideation currently. Has self increased her imipramine to 50 mg daily with improvement in her symptoms by her report, but did not have enough pills to continue doing higher dose - never on any other treatment.  she reports imipramine also helps her IBS symptoms Discussed with patient need for careful titration under medical guidance, but agree with increasing imipramine to 50 mg nightly. Followup see her 4-6 weeks to review results of treatment.  We'll continue further titration versus addition of second agent as needed Her updated medication list for this problem includes:    Imipramine Hcl 50 Mg Tabs (Imipramine hcl) .Marland Kitchen... 1 by mouth at bedtime  Orders: Prescription Created Electronically [phone])  Problem # 2:  DYSPAREUNIA (ICD-625.0)  Question if Premarin cream more affordable than Estrace cream?  Will write prescription and have patient explore Followup with GYN as previously scheduled symptoms may be exacerbated by problem #1 above  Orders: Prescription Created Electronically [phone])  Problem # 3:  DEFICIENCY OF OTHER VITAMINS (ICD-269.1) continue vitamin D replacement has ongoing by GYN with followup at their office as  scheduled laboratories reviewed today and are to be followed by [facility] per patient report for med management  Complete Medication List: 1)  Tylenol Extra Strength 500 Mg Tabs (Acetaminophen) .... Take prn 2)  Advil 200 Mg Tabs (Ibuprofen) .... Take prn 3)  Imipramine Hcl 50 Mg Tabs (Imipramine hcl) .Marland Kitchen.. 1 by mouth at bedtime 4)  Vitamin D (ergocalciferol) 50000 Unit Caps (Ergocalciferol) .... Take 1 by mouth q week for 1 month 5)  Premarin 0.625 Mg/gm Crea (Estrogens, conjugated) .Marland Kitchen.. 1g pv once daily for 1 week, then 0.5g  pv every other day  Patient Instructions: 1)  Will increase imipramine to 50 mg nightly. Please follow up in 4-6 weeks on results of this change 2)  Will try Premarin cream in place of Estrace cream as prescribed originally by gynecology. If unable to afford, please contact the GYN for other recommendations Prescriptions: PREMARIN 0.625 MG/GM CREA (ESTROGENS, CONJUGATED) 1g pv once daily for 1 week, then 0.5g  pv every other day  #1 tube x 2   Entered and Authorized by:   Newt Lukes MD   Signed by:   Newt Lukes MD on 09/30/2008   Method used:   [facility]  [address] [id number]       [address]       [place], [place]  [address]       Ph: [phone] or [phone]       Fax: [phone]   RxID:   [id number] IMIPRAMINE HCL 50 MG TABS (IMIPRAMINE HCL) 1 by mouth at bedtime  #30 x 3   Entered and Authorized by:   Newt Lukes MD   Signed by:   Newt Lukes MD on 09/30/2008   Method used:   [facility]        [facility]  [address] [id number]       [address]       [place], [place]  [address]       Ph: [phone] or [phone]       Fax: [phone]   RxID:   [id number]

## 2010-05-12 NOTE — Letter (Signed)
Summary: [facility] Instructions  [facility]  [place], [address] [address]   Phone: [phone]  Fax: [phone]       AARTI MANKOWSKI    January 13, 1956    MRN: [id number]        Procedure Day /Date:02/24/09     Arrival Time:130 pm     Procedure Time:230 pm     Location of Procedure:                    [address] [place] [facility] (4th Floor)   [place]   Starting 5 days prior to your procedure TODAY do not eat nuts, seeds, popcorn, corn, beans, peas,  salads, or any raw vegetables.  Do not take any fiber supplements (e.g. Metamucil, Citrucel, and Benefiber).  THE DAY BEFORE YOUR PROCEDURE         DATE: 02/23/09  DAY: SUN  1.  Drink clear liquids the entire day-NO SOLID FOOD  2.  Do not drink anything colored red or purple.  Avoid juices with pulp.  No orange juice.  3.  Drink at least 64 oz. (8 glasses) of fluid/clear liquids during the day to prevent dehydration and help the prep work efficiently.  CLEAR LIQUIDS INCLUDE: Water Jello Ice Popsicles Tea (sugar ok, no milk/cream) Powdered fruit flavored drinks Coffee (sugar ok, no milk/cream) Gatorade Juice: apple, white grape, white cranberry  Lemonade Clear bullion, consomm, broth Carbonated beverages (any kind) Strained chicken noodle soup Hard Candy                             4.  In the morning, mix first dose of MoviPrep solution:    Empty 1 Pouch A and 1 Pouch B into the disposable container    Add lukewarm drinking water to the top line of the container. Mix to dissolve    Refrigerate (mixed solution should be used within 24 hrs)  5.  Begin drinking the prep at 5:00 p.m. The MoviPrep container is divided by 4 marks.   Every 15 minutes drink the solution down to the next mark (approximately 8 oz) until the full liter is complete.   6.  Follow completed prep with 16 oz of clear liquid of your choice (Nothing red or purple).  Continue to drink clear  liquids until bedtime.  7.  Before going to bed, mix second dose of MoviPrep solution:    Empty 1 Pouch A and 1 Pouch B into the disposable container    Add lukewarm drinking water to the top line of the container. Mix to dissolve    Refrigerate  THE DAY OF YOUR PROCEDURE      DATE: 02/24/09 DAY: MON  Beginning at 930 a.m. (5 hours before procedure):         1. Every 15 minutes, drink the solution down to the next mark (approx 8 oz) until the full liter is complete.  2. Follow completed prep with 16 oz. of clear liquid of your choice.    3. You may drink clear liquids until 1230 pm (2 HOURS BEFORE PROCEDURE).   MEDICATION INSTRUCTIONS  Unless otherwise instructed, you should take regular prescription medications with a small sip of water   as early as possible the morning of your procedure.  Diabetic patients - see separate instructions.           OTHER INSTRUCTIONS  You will need a responsible  adult at least 55 years of age to accompany you and drive you home.   This person must remain in the waiting room during your procedure.  Wear loose fitting clothing that is easily removed.  Leave jewelry and other valuables at home.  However, you may wish to bring a book to read or  an iPod/MP3 player to listen to music as you wait for your procedure to start.  Remove all body piercing jewelry and leave at home.  Total time from sign-in until discharge is approximately 2-3 hours.  You should go home directly after your procedure and rest.  You can resume normal activities the  day after your procedure.  The day of your procedure you should not:   Drive   Make legal decisions   Operate machinery   Drink alcohol   Return to work  You will receive specific instructions about eating, activities and medications before you leave.    The above instructions have been reviewed and explained to me by   _______________________    I fully understand and can verbalize  these instructions _____________________________ Date _________

## 2010-05-12 NOTE — Progress Notes (Signed)
Summary: lomotil  Phone Note Refill Request Message from:  Fax from Pharmacy on January 19, 2010 10:47 AM  Refills Requested: Medication #1:  DIPHENOXYLATE-ATROPINE 2.5-0.025 MG TABS take 1 q 6 hours as needed for diarrhea # 30   [facility] [address] [phone] Last ov 12/03/09 Is this ok to refill?  Next Appointment Scheduled: none Initial call taken by: Orlan Leavens RMA,  January 19, 2010 10:47 AM  Follow-up for Phone Call        ok to fill as prev rx'd Follow-up by: Newt Lukes MD,  January 19, 2010 11:03 AM  Additional Follow-up for Phone Call Additional follow up Details #1::        [phone]. Updated EMR Additional Follow-up by: Orlan Leavens RMA,  January 19, 2010 11:43 AM    Prescriptions: DIPHENOXYLATE-ATROPINE 2.5-0.025 MG TABS (DIPHENOXYLATE-ATROPINE) take 1 q 6 hours as needed for diarrhea  #30 x 0   Entered by:   Orlan Leavens RMA   Authorized by:   Newt Lukes MD   Signed by:   Orlan Leavens RMA on 01/19/2010   Method used:   Historical   RxID:   [id number]

## 2010-05-12 NOTE — Assessment & Plan Note (Signed)
Summary: 3 MO ROV /NWS $50   Vital Signs:  Patient profile:   55 year old female Menstrual status:  regular Height:      63 inches (160.02 cm) Weight:      139.4 pounds (63.36 kg) O2 Sat:      96 % Temp:     98.7 degrees F (37.06 degrees C) oral Pulse rate:   90 / minute BP sitting:   100 / 76  (left arm) Cuff size:   regular  Vitals Entered By: Orlan Leavens (February 19, 2009 11:12 AM) CC: 3 month follow-up/ Want to discuss meds, also need refill on lomotil and paxil Is Patient Diabetic? No Pain Assessment Patient in pain? no        Primary Care Provider:  Newt Lukes MD  CC:  3 month follow-up/ Want to discuss meds and also need refill on lomotil and paxil.  History of Present Illness: diarrhea - improved but not resolved - taking lomotil  which helps no abd pain - no n/v occ blood (BRBPR) mixed with stool, nonpainful hx prior GI eval was "years ago"  depression - doing well as long as has medications - no adv SE with paxil - feels well on this - requests xanax (tried friends when forgot her imipramine on vacation and couldn;t sleep)  arthirtis - now using aleve in place of advil with good results - no swelling in joints   Current Medications (verified): 1)  Imipramine Hcl 50 Mg Tabs (Imipramine Hcl) .... Take 1 By Mouth Qhs 2)  Paxil 20 Mg Tabs (Paroxetine Hcl) .Marland Kitchen.. 1 By Mouth Once Daily 3)  Vitamin D3 1000 Unit Caps (Cholecalciferol) .... Take 1 By Mouth Qd 4)  Lomotil 2.5-0.025 Mg Tabs (Diphenoxylate-Atropine) .Marland Kitchen.. 1 By Mouth Every 6 Hours As Needed Diarrhea 5)  Aleve 220 Mg Tabs (Naproxen Sodium) .... Use Prn  Allergies (verified): 1)  ! Tetracycline  Past History:  Past Medical History: Reviewed history from 09/30/2008 and no changes required. Depression IBS - diarrhea predom Skin cancer - Squam cell ca - Dr. Max Fickle osteopenia  Review of Systems  The patient denies fever, weight loss, chest pain, headaches, and depression.     Physical Exam  General:  alert, well-developed, well-nourished, and cooperative to examination.   spouse at side  Lungs:  normal respiratory effort, no intercostal retractions or use of accessory muscles; normal breath sounds bilaterally - no crackles and no wheezes.    Heart:  normal rate, regular rhythm, no murmur, and no rub. BLE without edema.  Psych:  Oriented X3, memory intact for recent and remote, normally interactive, fair eye contact, not anxious appearing, not depressed appearing, and not agitated.      Impression & Recommendations:  Problem # 1:  DIARRHEA (ICD-787.91)  improved - but still taking lomotil - may all be IBS but will ask GI for eval and tx - refer Her updated medication list for this problem includes:    Lomotil 2.5-0.025 Mg Tabs (Diphenoxylate-atropine) .Marland Kitchen... 1 by mouth every 6 hours as needed diarrhea  Orders: Gastroenterology Referral (GI)  Problem # 2:  DEPRESSION (ICD-311) cont same -  will given limited supply of xanax -  counseled on addictive potential of BZs and need for intemittent and judicious use as inc number/dose will not be supplied by me  Her updated medication list for this problem includes:    Imipramine Hcl 50 Mg Tabs (Imipramine hcl) .Marland Kitchen... Take 1 by mouth qhs    Paxil  20 Mg Tabs (Paroxetine hcl) .Marland Kitchen... 1 by mouth once daily    Alprazolam 0.5 Mg Tabs (Alprazolam) .Marland Kitchen... 1/2-1 tab by mouth at bedtime as needed for sleep  Problem # 3:  IRRITABLE BOWEL SYNDROME (ICD-564.1)  Orders: Gastroenterology Referral (GI)  Problem # 4:  ARTHRITIS (ICD-716.90)  Complete Medication List: 1)  Imipramine Hcl 50 Mg Tabs (Imipramine hcl) .... Take 1 by mouth qhs 2)  Paxil 20 Mg Tabs (Paroxetine hcl) .Marland Kitchen.. 1 by mouth once daily 3)  Vitamin D3 1000 Unit Caps (Cholecalciferol) .... Take 1 by mouth qd 4)  Lomotil 2.5-0.025 Mg Tabs (Diphenoxylate-atropine) .Marland Kitchen.. 1 by mouth every 6 hours as needed diarrhea 5)  Aleve 220 Mg Tabs (Naproxen sodium) .... Use  prn 6)  Alprazolam 0.5 Mg Tabs (Alprazolam) .... 1/2-1 tab by mouth at bedtime as needed for sleep  Contraindications/Deferment of Procedures/Staging:    Test/Procedure: FLU VAX    Reason for deferment: patient declined   Patient Instructions: 1)  it was good to see you today. - 2)  we'll make referral to GI. Our office will contact you regarding this appointment once made.  3)  careful use of xanax to avoid addiction - more than 10/month will not be prescribed by this office 4)  Please schedule a follow-up appointment in 6-12, sooner if problems.  Prescriptions: IMIPRAMINE HCL 50 MG TABS (IMIPRAMINE HCL) take 1 by mouth qhs  #30 x 5   Entered and Authorized by:   Newt Lukes MD   Signed by:   Newt Lukes MD on 02/19/2009   Method used:   [facility]ronically to        [facility]  [address] [id number]       [address]       [place], [place]  [address]       Ph: [phone] or [phone]       Fax: [phone]   RxID:   [id number] LOMOTIL 2.5-0.025 MG TABS (DIPHENOXYLATE-ATROPINE) 1 by mouth every 6 hours as needed diarrhea  #30 x 1   Entered and Authorized by:   Newt Lukes MD   Signed by:   Newt Lukes MD on 02/19/2009   Method used:   Print then Give to Patient   RxID:   [id number] ALPRAZOLAM 0.5 MG TABS (ALPRAZOLAM) 1/2-1 tab by mouth at bedtime as needed for sleep  #10 x 11   Entered and Authorized by:   Newt Lukes MD   Signed by:   Newt Lukes MD on 02/19/2009   Method used:   Print then Give to Patient   RxID:   [id number] PAXIL 20 MG TABS (PAROXETINE HCL) 1 by mouth once daily  #30 x 5   Entered by:   Orlan Leavens   Authorized by:   Newt Lukes MD   Signed by:   Orlan Leavens on 02/19/2009   Method used:   [facility]        [facility]  [address] [id number]       [address]       [place], [place]  [address]       Ph: [phone] or [phone]       Fax:  [phone]   RxID:   [id number]

## 2010-05-12 NOTE — Assessment & Plan Note (Signed)
  Review of gastrointestinal problems: 1. Lymphocytic, microscopic colitis. Diarrhea for 10 years, eventually colonoscopy December, 2010 by Dr. Ardis Hughs. Essentially normal macroscopically however biopsies showed lymphocytic colitis. Budesonide was started With good improvement, added Imodium As well.  February, 2010: taking to Entocort pills a day, for Imodium pills a day with great results.   History of Present Illness Visit Type: Follow-up Visit Primary GI MD: Owens Loffler MD Primary Provider: Rowe Clack MD Chief Complaint: loose stools History of Present Illness:     very pleasant a 55 year old woman whom I last saw about 6 weeks ago. Since then she has not had diarrhea in many weeks (5 weeks).  She is taking 4 immodium a day and 2 entocort a day.  She has not tried backing down on [employer] medicine yet.   she wants to cut back on her Entocort.           Current Medications (verified): 1)  Imipramine Hcl 50 Mg Tabs (Imipramine Hcl) .... Take 1 By Mouth Qhs 2)  Paxil 20 Mg Tabs (Paroxetine Hcl) .Marland Kitchen.. 1 By Mouth Once Daily 3)  Aleve 220 Mg Tabs (Naproxen Sodium) .... Use Prn 4)  Alprazolam 0.5 Mg Tabs (Alprazolam) .... 1/2-1 Tab By Mouth At Bedtime As Needed For Sleep 5)  Entocort Ec 3 Mg Xr24h-Cap (Budesonide) .... Take 3 Pills Once Daily By Mouth 6)  Imodium A-D 2 Mg Tabs (Loperamide Hcl) .... Take 2 Tablets  Every Morning and 2 Tablets Every Evening  Allergies (verified): 1)  ! Tetracycline  Vital Signs:  Patient profile:   55 year old female Menstrual status:  regular Height:      63 inches Weight:      139.50 pounds BMI:     24.80 Pulse rate:   84 / minute Pulse rhythm:   regular BP sitting:   100 / 70  (left arm) Cuff size:   regular  Vitals Entered By: June McMurray CMA Deborra Medina) (June 02, 2009 10:40 AM)  Physical Exam  Additional Exam:  Constitutional: generally well appearing Psychiatric: alert and oriented times 3 Abdomen: soft, non-tender,  non-distended, normal bowel sounds    Impression & Recommendations:  Problem # 1:  lymphocytic colitis she is doing very well on current regimen. She wants to cut back on her steroids which I agree with. She will therefore begin taking one Entocort pill a day and will continue on Imodium daily as well. She will return to see me in 8-10 weeks and sooner if needed.  Patient Instructions: 1)  Decrease the entocort to one pill a day...can stop altogether in 6 weeks if you are doing well. 2)  Stay on immodium 4 pills a day, can increase as needed. 3)  Return to see Dr. Ardis Hughs in 8-10 weeks.  Call sooner if needed. 4)  A copy of this information will be sent to Dr. Asa Lente. 5)  The medication list was reviewed and reconciled.  All changed / newly prescribed medications were explained.  A complete medication list was provided to the patient / caregiver.

## 2010-05-12 NOTE — Assessment & Plan Note (Signed)
  Review of gastrointestinal problems: 1. Lymphocytic, microscopic colitis. Diarrhea for 10 years, eventually colonoscopy December, 2010 by Dr. Ardis Hughs. Essentially normal macroscopically however biopsies showed lymphocytic colitis. Budesonide was started    History of Present Illness Visit Type: Follow-up Visit Primary GI MD: Owens Loffler MD Primary Provider: Rowe Clack MD Chief Complaint: loose stools History of Present Illness:     very pleasant 55 year old woman whom I just diagnosed with lymphocytic colitis. She started 3 pills budesonide about one month ago, initially a complete resolution in symptoms..no diarrhea.  unfortunately she had tremors, memory difficulty.  She changed the timing of the pills so that she would take them at night before bed, her "mind" is much better, normal now but her diarrhea is now not under as good of control. She does admit however that her diarrhea is overall still much much better that was prior to the budesonide.           Current Medications (verified): 1)  Imipramine Hcl 50 Mg Tabs (Imipramine Hcl) .... Take 1 By Mouth Qhs 2)  Paxil 20 Mg Tabs (Paroxetine Hcl) .Marland Kitchen.. 1 By Mouth Once Daily 3)  Vitamin D3 1000 Unit Caps (Cholecalciferol) .... Take 1 By Mouth Qd 4)  Aleve 220 Mg Tabs (Naproxen Sodium) .... Use Prn 5)  Alprazolam 0.5 Mg Tabs (Alprazolam) .... 1/2-1 Tab By Mouth At Bedtime As Needed For Sleep 6)  Entocort Ec 3 Mg Xr24h-Cap (Budesonide) .... Take 3 Pills Once Daily By Mouth  Allergies (verified): 1)  ! Tetracycline  Vital Signs:  Patient profile:   55 year old female Menstrual status:  regular Height:      63 inches Weight:      139.25 pounds BMI:     24.76 Pulse rate:   80 / minute Pulse rhythm:   regular BP sitting:   112 / 76  (left arm) Cuff size:   regular  Vitals Entered By: June McMurray CMA Deborra Medina) (April 18, 2009 10:50 AM)  Physical Exam  Additional Exam:  Constitutional: generally well  appearing Psychiatric: alert and oriented times 3 Abdomen: soft, non-tender, non-distended, normal bowel sounds    Impression & Recommendations:  Problem # 1:  microscopic, lymphocytic colitis we will try backing down her budesonide to 2 pills at night. She will increase her Imodium to 2 pills twice a day. She will return to see me in 4-5 weeks and sooner if needed.  Patient Instructions: 1)  Try to substitute tylenol for your alleve. 2)  Cut back the budesonide to 2 pills a night 3)  Change the immodium to 2 pills, twice daily. 4)  Please schedule a follow-up appointment in 4 to 6 weeks, sooner if needed. 5)  The medication list was reviewed and reconciled.  All changed / newly prescribed medications were explained.  A complete medication list was provided to the patient / caregiver.

## 2010-05-12 NOTE — Progress Notes (Signed)
  Phone Note Other Incoming   Request: Send information Action Taken: Information Sent Summary of Call: Request for records received from [facility]. Request forwarded to [employer].     

## 2010-05-12 NOTE — Progress Notes (Signed)
Summary: Rx refill req/ VAL pt  Phone Note Call from Patient Call back at Home Phone [phone]   Caller: Patient Summary of Call: Pt called requesting a refill and increase in amount of Alprazolam 0.25mg . Pt states that her husband passed away from Pancreatic Cancer 05/15 and cannot sleep without it, She also says she has had to use it during the day sometimes due to his death. Please advise on refill? Initial call taken by: Margaret Pyle, CMA,  September 15, 2009 11:33 AM  Follow-up for Phone Call        pt informed, Rx [facility] to [facility] [address] Follow-up by: Margaret Pyle, CMA,  September 15, 2009 1:26 PM    Prescriptions: ALPRAZOLAM 0.5 MG TABS (ALPRAZOLAM) 1/2-1 tab by mouth at bedtime as needed for sleep  #30 x 0   Entered and Authorized by:   Corwin Levins MD   Signed by:   Corwin Levins MD on 09/15/2009   Method used:   Print then Give to Patient   RxID:   [id number]  done hardcopy to [facility] side B - dahlia  Corwin Levins MD  September 15, 2009 12:36 PM

## 2010-05-12 NOTE — Progress Notes (Signed)
  Phone Note Other Incoming   Request: Send information Summary of Call: Request for records received from [facility]. Request forwarded to [facility].     

## 2010-05-12 NOTE — Assessment & Plan Note (Signed)
Summary: NEW PT/ [employer] / $50 / CD   Vital Signs:  Patient profile:   55 year old female Menstrual status:  regular LMP:     04/13/1983 Height:      63 inches (160.02 cm) Weight:      139.8 pounds (63.55 kg) BMI:     24.85 O2 Sat:      99 % Temp:     98.1 degrees F (36.72 degrees C) oral Pulse rate:   70 / minute BP sitting:   118 / 88  (left arm) Cuff size:   regular  Vitals Entered By: Orlan Leavens (July 16, 2008 8:18 AM) CC: NEW PATIENT EST Is Patient Diabetic? No LMP (date): 04/13/1983  years   days  Menstrual Status regular Enter LMP: 04/13/1983 Last PAP Date 04/13/1995   Primary Care Provider:  Newt Lukes MD  CC:  NEW PATIENT EST.  History of Present Illness: 55 yo WF here to establish care  multiple conerns (not had regular provider for over 3 years)  c/o "bad circulation" in legs - purple and cold in legs up to knee numbness in feet . also will affect hands but not as frequently. no h/o nonhealing wounds. no h/o DM or high chol or renal dz known. no FH of PVD, ?CAD in mom  also concerned re: need for "female exam" check up- no pap since 1997 - s/p hyst 1985 painful vag intercourse causing no vag sex in over 2 years married 55 yo but lack of sex causing stress in relationship no vag bleeding - spont or with sex  h/o IBS (diarrhea predom) and depression- took imipramine ?20mg  hs for over 10 years -  changed to  elavil by urgent care about 1 year ago but doesn't like SE - feels "slow" and would prefer to return to imipramine now (no elavil in over 10 d)  re: depression, multiple social stressors - loss of fatherinlaw 1/09 and mother May 29, 2022 poor sleep and racing thoughts in last yr no wt changes no SI/HI  Preventive Screening-Counseling & Management     Alcohol drinks/day: <1     Smoking Status: quit > 6 months     Year Quit: 2009     Pack years: 70     Does Patient Exercise: yes     Type of exercise: gardening, walking  Current Problems  (verified): 1)  Dyspareunia  (ICD-625.0) 2)  R/O Unspecified Circulatory System Disorder  (ICD-459.9) 3)  Carcinoma, Squamous Cell  (ICD-199.1) 4)  Headache  (ICD-784.0) 5)  Arthritis  (ICD-716.90) 6)  Depression  (ICD-311)  Current Medications (verified): 1)  Tylenol Extra Strength 500 Mg Tabs (Acetaminophen) .... Take Prn 2)  Advil 200 Mg Tabs (Ibuprofen) .... Take Prn 3)  Imipramine Hcl 25 Mg Tab (Imipramine Hcl) .... Take 1 Tablet By Mouth At Bedtime  Allergies (verified): 1)  ! Tetracycline  Past History:  Past Medical History:    Depression    IBS - diarrhea predom    Skin cancer - Squam cell ca - Dr. Max Fickle  Past Surgical History:    Appendectomy (1969)    Hysterectomy (1985)    Breast biopsy (1987)    Surgery on both feet for joints in toes (1990)  Family History:    mom deceased age 67 2022-05-29) - CAD, dementia    dad deceased age 79 - asthma  Social History:    quit smoking 2009 (35 pk yr hx)    Marital Status: Married  Children:  2 grown - son in [place] and dtr in [place]    Occupation: homemaker    Regular exercise-yes - gardening    occ EtOH    Does Patient Exercise:  yes    Smoking Status:  quit > 6 months  Review of Systems      See HPI General:  Complains of fatigue, malaise, and sleep disorder; denies chills, fever, sweats, weakness, and weight loss. Eyes:  Denies blurring. CV:  See HPI; Complains of fatigue; denies bluish discoloration of lips or nails, fainting, leg cramps with exertion, near fainting, palpitations, swelling of feet, and swelling of hands; occ purple color of feet to knees, worse in cold - not painful, no ulcers, no swelling. Resp:  Denies cough, coughing up blood, and shortness of breath. GI:  See HPI; Complains of constipation and diarrhea; denies abdominal pain, nausea, and vomiting. GU:  See HPI; Complains of decreased libido; denies abnormal vaginal bleeding, discharge, dysuria, and hematuria. MS:  arthritis pain in R  great toe and R thumb - no swelling. Derm:  See HPI; Complains of changes in color of skin; denies dryness, excessive perspiration, flushing, and poor wound healing. Psych:  See HPI; Complains of depression and easily tearful; denies anxiety, easily angered, panic attacks, suicidal thoughts/plans, and thoughts /plans of harming others. Endo:  Complains of cold intolerance; denies heat intolerance and polyuria. Heme:  Denies abnormal bruising and fevers.  Physical Exam  General:  alert, well-developed, well-nourished, and cooperative to examination.    Head:  normocephalic, atraumatic, and no abnormalities observed.    Eyes:  vision grossly intact; pupils equal, round and reactive to light.  conjunctiva and lids normal.    Ears:  Normal external pinna bilaterally to observation and palpation. Hearing is grossly normal bilaterally. Nose:  no external deformity, no external erythema, and no nasal discharge.   Mouth:  good dentition, no gingival abnormalities, and pharynx pink and moist.   Neck:  supple, full ROM, no masses, no thyromegaly; no thyroid nodules or tenderness. no JVD or carotid bruits.   Chest Wall:  no deformities and no tenderness.   Lungs:  normal respiratory effort, no intercostal retractions or use of accessory muscles; normal breath sounds bilaterally - no crackles and no wheezes.    Heart:  normal rate, regular rhythm, no murmur, and no rub. BLE without edema. normal pulses with normal cap refill in all 4 extremities    Abdomen:  soft, non-tender, normal bowel sounds, no distention; no masses and no appreciable hepatomegaly or splenomegaly.   Genitalia:  deferred @ this time Msk:  no swelling or erythema to R thumb or R great toe. FROM w/o pain or crepitis Pulses:  R and L carotid, radial,and dorsalis pedis are full and equal bilaterally Extremities:  No clubbing, , edema, or deformity noted with normal full range of motion of all joints.   Neurologic:  alert & oriented X3,  cranial nerves II-XII intact, strength normal in all extremities, sensation intact to light touch, and gait normal.  speech fluent and approp Skin:  turgor normal, no rashes, no suspicious lesions, no ecchymoses, no purpura, no ulcerations, and no edema.  mottled purple coloration to B feet above ankles with normal blanchin to pressure. cool to touch Cervical Nodes:  no anterior cervical adenopathy and no posterior cervical adenopathy.   Psych:  Oriented X3, memory intact for recent and remote, normally interactive, good eye contact, not anxious appearing, not agitated, dysphoric affect, and subdued.  Impression & Recommendations:  Problem # 1:  DEPRESSION (ICD-311)  wil resume prev rx'd  imipramine at pt request  Orders: TLB-CBC Platelet - w/Differential (85025-CBCD) TLB-TSH (Thyroid Stimulating Hormone) (40102-[id number]) Prescription Created Electronically [phone])  Her updated medication list for this problem includes:    Imipramine Hcl 25 Mg Tab (Imipramine hcl) .Marland Kitchen... Take 1 tablet by mouth at bedtime  Discussed treatment options, including trial of antidpressant medication. Will consider referal to behavioral health. Follow-up call in in 24-48 hours and recheck in 2 mos, sooner as needed. Patient agrees to call if any worsening of symptoms or thoughts of doing harm arise. Verified that the patient has no suicidal ideation at this time.   Problem # 2:  R/O UNSPECIFIED CIRCULATORY SYSTEM DISORDER (ICD-459.9) cool and mottled but approp pulses - no numbness, pain or concerning exam changes consider ABIs given h/o tobacco - other risk startification with FLP today and r/o anmeia, thyroid dz f/u 2 mo Orders: TLB-Lipid Panel ([id number])  Problem # 3:  DYSPAREUNIA (ICD-625.0)  refer to gyn to establish normalcy prior to rx'ing hormones at pt request   Orders: Gynecologic Referral (Gyn)  Problem # 4:  ARTHRITIS (ICD-716.90) exam bengin. cont as needed advil and survellince as  needed  Problem # 5:  IRRITABLE BOWEL SYNDROME (ICD-564.1) sxs appear resaonably controlled at this time by hx med change as for #1 above f/u as needed will need screening colo soon! (age >45!) - rec sched physical next 3 mos  Complete Medication List: 1)  Tylenol Extra Strength 500 Mg Tabs (Acetaminophen) .... Take prn 2)  Advil 200 Mg Tabs (Ibuprofen) .... Take prn 3)  Imipramine Hcl 25 Mg Tab (Imipramine hcl) .... Take 1 tablet by mouth at bedtime  Patient Instructions: 1)  will check labs today  2)  will refer to gyn for pelvic eval 3)  Please schedule a follow-up appointment in 2 months. Prescriptions: IMIPRAMINE HCL 25 MG TAB (IMIPRAMINE HCL) Take 1 tablet by mouth at bedtime  #30 x 2   Entered and Authorized by:   Newt Lukes MD   Signed by:   Newt Lukes MD on 07/16/2008   Method used:   Electronically to        [facility] (retail)       [address]       [place], [place]  [address]       Ph: [phone]       Fax: [phone]   RxID:   [id number]         Preventive Care Screening  Pap Smear:    Date:  07/16/2008    Results:  Normal  Mammogram:    Date:  04/12/1985    Results:  normal   Appended Document: NEW PT/ [employer] / $50 / CD PAP smear not done today as prev documented in today's note!  Appended Document: Orders Update    Clinical Lists Changes  Orders: Added new Service order of New Patient Level V [phone]) - Signed

## 2010-05-12 NOTE — Assessment & Plan Note (Signed)
Summary: depressed/#/cd   Vital Signs:  Patient profile:   55 year old female Menstrual status:  regular Height:      63 inches (160.02 cm) Weight:      132.6 pounds (60.27 kg) O2 Sat:      96 % on Room air Temp:     98.6 degrees F (37.00 degrees C) oral Pulse rate:   90 / minute BP sitting:   110 / 80  (left arm) Cuff size:   regular  Vitals Entered By: Orlan Leavens (October 01, 2009 9:44 AM)  O2 Flow:  Room air CC: Depression Is Patient Diabetic? No Pain Assessment Patient in pain? no        Primary Care Provider:  Newt Lukes MD  CC:  Depression.  History of Present Illness: diarrhea - improved but not resolved - worse with emotional stress taking lomotil  which helps no abd pain - no n/v occ blood (BRBPR) mixed with stool, nonpainful dx and tx ongoing for microscopic cloitis  depression - exac by recent sudden death of spouse 08/25/2009 6wk between his dx and death related to met panc cancer 100% compliance with meds but needs something for nerves no adv SE with paxil - feels well on this - not in counseling at this time  arthirtis - now using aleve in place of advil with good results - no swelling in joints   Current Medications (verified): 1)  Imipramine Hcl 50 Mg Tabs (Imipramine Hcl) .... Take 1 By Mouth Qhs 2)  Paxil 20 Mg Tabs (Paroxetine Hcl) .Marland Kitchen.. 1 By Mouth Once Daily 3)  Alprazolam 0.5 Mg Tabs (Alprazolam) .Marland Kitchen.. 1 Tab By Mouth At Bedtime As Needed For Sleep 4)  Imodium A-D 2 Mg Tabs (Loperamide Hcl) .... Take 4 Tablets  Every Morning and 4 Tablets Every Evening 5)  Cholestyramine 4 Gm/dose Powd (Cholestyramine) .Marland Kitchen.. 1 Dose Every Morning and Every Afternoon 6)  Entocort Ec 3 Mg Xr24h-Cap (Budesonide) .... 3 Capsules By Mouth Once Daily 7)  Tylenol Extra Strength 500 Mg Tabs (Acetaminophen) .... 2 Tablets By Mouth Once Daily 8)  Metronidazole 250 Mg  Tabs (Metronidazole) .... Take 1 Pill Three Times A Day For 10 Days  Allergies (verified): 1)  !  Tetracycline  Past History:  Past Medical History: Depression microscopic colitis Skin cancer - Squam cell ca  osteopenia    MD roster: GI - jacobs derm - clyde nolan  Family History: mom deceased age 69 2022/05/25) - CAD, dementia dad deceased age 29 - asthma sister with breast cancer   Social History: quit smoking Aug 26, 2007 (35 pk yr hx) widowed 08/24/2009 -  Children:  2 grown - son in [place] and dtr in [place] Occupation: homemaker Regular exercise-yes - gardening occ EtOH 2-4 cups of coffee a day  Review of Systems       The patient complains of depression.  The patient denies fever, weight gain, chest pain, and headaches.    Physical Exam  General:  alert, well-developed, well-nourished, and cooperative to examination.  Lungs:  normal respiratory effort, no intercostal retractions or use of accessory muscles; normal breath sounds bilaterally - no crackles and no wheezes.    Heart:  normal rate, regular rhythm, no murmur, and no rub. BLE without edema.  Psych:  Oriented X3, memory intact for recent and remote, normally interactive, fair eye contact, not anxious appearing, mod depressed appearing with tearfulness during hx/exam,  not agitated.      Impression & Recommendations:  Problem #  1:  DEPRESSION (ICD-311)  long hx same - prev followed with dr. Renato Gails - now exac by acute grief rxn - inc paxil, change xanax to klonopin and refer to behav health - needs counseling and MD eval/tx Time spent with patient 30 minutes, more than 50% of this time was spent counseling patient on depression and her grief Her updated medication list for this problem includes:    Imipramine Hcl 50 Mg Tabs (Imipramine hcl) .Marland Kitchen... Take 1 by mouth qhs    Paroxetine Hcl 40 Mg Tabs (Paroxetine hcl) .Marland Kitchen... 1 by mouth once daily    Klonopin 0.5 Mg Tabs (Clonazepam) .Marland Kitchen... 1/4-1/2 tab by mouth  every morning as needed for anxiety/nerves and 1/2-1 tab by mouth at bedtime as needed  Orders: Prescription  Created Electronically [phone]) Psychology Referral (Psychology)  Complete Medication List: 1)  Imipramine Hcl 50 Mg Tabs (Imipramine hcl) .... Take 1 by mouth qhs 2)  Paroxetine Hcl 40 Mg Tabs (Paroxetine hcl) .Marland Kitchen.. 1 by mouth once daily 3)  Klonopin 0.5 Mg Tabs (Clonazepam) .... 1/4-1/2 tab by mouth  every morning as needed for anxiety/nerves and 1/2-1 tab by mouth at bedtime as needed 4)  Imodium A-d 2 Mg Tabs (Loperamide hcl) .... Take 4 tablets  every morning and 4 tablets every evening 5)  Cholestyramine 4 Gm/dose Powd (Cholestyramine) .Marland Kitchen.. 1 dose every morning and every afternoon 6)  Entocort Ec 3 Mg Xr24h-cap (Budesonide) .... 3 capsules by mouth once daily 7)  Tylenol Extra Strength 500 Mg Tabs (Acetaminophen) .... 2 tablets by mouth once daily 8)  Metronidazole 250 Mg Tabs (Metronidazole) .... Take 1 pill three times a day for 10 days  Patient Instructions: 1)  it was good to see you today. 2)  increase Paxil dose and change xanax to klonopin for nerves as needed as discussed - 3)  call behavioral health [phone]) or dr.plovsky [phone]) or dr. Jennelle Human [phone]) to get a psychiatry appointment with an MD - also we'll make referral for counseling to help support you. Our office will contact you regarding this appointment once made.  4)  Please schedule a follow-up appointment in 2 months, sooner if problems. Prescriptions: KLONOPIN 0.5 MG TABS (CLONAZEPAM) 1/4-1/2 tab by mouth  every morning as needed for anxiety/nerves and 1/2-1 tab by mouth at bedtime as needed  #40 x 1   Entered and Authorized by:   Newt Lukes MD   Signed by:   Newt Lukes MD on 10/01/2009   Method used:   Print then Give to Patient   RxID:   [id number] PAROXETINE HCL 40 MG TABS (PAROXETINE HCL) 1 by mouth once daily  #30 x 3   Entered and Authorized by:   Newt Lukes MD   Signed by:   Newt Lukes MD on 10/01/2009   Method used:   [facility]        [id number]       [address]       [place], [place]  [address]       Ph: [phone] or [phone]       Fax: [phone]   RxID:   [id number]

## 2010-05-12 NOTE — Progress Notes (Signed)
Summary: Path results  Phone Note Outgoing Call Call back at Home Phone [phone]   Call placed by: Chales Abrahams CMA (AAMA),  February 27, 2009 9:05 AM Summary of Call: pt was informed of the path results and rx was sent as well as ROV made Initial call taken by: Chales Abrahams CMA (AAMA),  February 27, 2009 9:05 AM    New/Updated Medications: ENTOCORT EC 3 MG XR24H-CAP (BUDESONIDE) take 3 pills once daily by mouth Prescriptions: ENTOCORT EC 3 MG XR24H-CAP (BUDESONIDE) take 3 pills once daily by mouth  #90 x 3   Entered by:   Chales Abrahams CMA (AAMA)   Authorized by:   Rachael Fee MD   Signed by:   Chales Abrahams CMA (AAMA) on 02/27/2009   Method used:   [id number]       [address]       [place], [place]  [address]       Ph: [phone] or [phone]       Fax: [phone]   RxID:   [id number]

## 2010-05-12 NOTE — Procedures (Signed)
Summary: Colonoscopy  Patient: [name] Note: All result statuses are Final unless otherwise noted.  Tests: (1) Colonoscopy (COL)   COL Colonoscopy           [facility]     [place], [place]  [address]           COLONOSCOPY PROCEDURE REPORT           PATIENT:  Monique Swanson, Monique Swanson  MR#:  [id number]     BIRTHDATE:  [birth year], 53 yrs. old  GENDER:  female           ENDOSCOPIST:  Milus Banister, MD     Referred by:  Gwendolyn Grant, M.D.           PROCEDURE DATE:  02/24/2009     PROCEDURE:  Colonoscopy with biopsy     ASA CLASS:  Class II     INDICATIONS:  chronic diarrhea (for many years)           MEDICATIONS:   Fentanyl 100 mcg IV, Versed 10 mg IV           DESCRIPTION OF PROCEDURE:   After the risks benefits and     alternatives of the procedure were thoroughly explained, informed     consent was obtained.  Digital rectal exam was performed and     revealed no rectal masses.   The LB [id number] endoscope     was introduced through the anus and advanced to the terminal ileum     which was intubated for a short distance, without limitations.     The quality of the prep was good, using MoviPrep.  The instrument     was then slowly withdrawn as the colon was fully examined.     <<PROCEDUREIMAGES>>           FINDINGS:  Abnormal appearing mucosa. The colonic mucosa was     slightly granular appearing but otherwise normal. Biopsies were     taken to check for chronic colitis, microscopic colitis (see     image4).  This was otherwise a normal examination of the colon     (see image1, image2, and image3).   Retroflexed views in the     rectum revealed no abnormalities.    The scope was then withdrawn     from the patient and the procedure completed.           COMPLICATIONS:  None           ENDOSCOPIC IMPRESSION:     1) Mildly granular appearing mucosa throughout colon.  Random     biopsies taken.     2) Otherwise normal examination  to the terminal ileum.           RECOMMENDATIONS:     Await final biopsies.  If no colitis is found, will consider     Alosetron for diarrhea predominant IBS.           ______________________________     Milus Banister, MD           n.     eSIGNED:   Milus Banister at 02/24/2009 02:54 PM           [address] [place], [phone]  Note: An exclamation mark (!) indicates a result that was not dispersed into the flowsheet. Document Creation Date: 02/24/2009 2:54 PM _______________________________________________________________________  (1) Order result status: Final Collection or observation date-time:  02/24/2009 14:49 Requested date-time:  Receipt date-time:  Reported date-time:  Referring Physician:   Ordering Physician: Owens Loffler [phone]) Specimen Source:  Source: Tawanna Cooler Order Number: [id number] Lab site:

## 2010-05-12 NOTE — Assessment & Plan Note (Signed)
History of Present Illness Visit Type: Initial Consult Primary GI MD: Owens Loffler MD Primary Provider: Rowe Clack MD Chief Complaint: chronic diarrhea History of Present Illness:     very pleasant 55 year old woman who has had loose stools for MANY years.  Was told she had IBS, did not have colonoscopy but GI doc said she had IBS.  She has lower abd cramping, helped with BM.  On average she will have 10 times a day, even at night.  It is rarely bloody, but sometimes can see slight red streaking after excessive wiping.  Overall stable weight.  Started imipramine. many years ago and this helped.  She will need immodium, lomotil several times a day.  If she doesn't take any immodium or lomotil she will have worse diarrhea. She is never constipated.  Never a solid stool.           Current Medications (verified): 1)  Imipramine Hcl 50 Mg Tabs (Imipramine Hcl) .... Take 1 By Mouth Qhs 2)  Paxil 20 Mg Tabs (Paroxetine Hcl) .Marland Kitchen.. 1 By Mouth Once Daily 3)  Vitamin D3 1000 Unit Caps (Cholecalciferol) .... Take 1 By Mouth Qd 4)  Lomotil 2.5-0.025 Mg Tabs (Diphenoxylate-Atropine) .Marland Kitchen.. 1 By Mouth Every 6 Hours As Needed Diarrhea 5)  Aleve 220 Mg Tabs (Naproxen Sodium) .... Use Prn 6)  Alprazolam 0.5 Mg Tabs (Alprazolam) .... 1/2-1 Tab By Mouth At Bedtime As Needed For Sleep  Allergies (verified): 1)  ! Tetracycline  Past History:  Past Medical History: Depression IBS - diarrhea predom Skin cancer - Squam cell ca - Dr. Ulice Bold osteopenia   Past Surgical History: Appendectomy (1969) Hysterectomy (1985) Breast biopsy (1987) Surgery on both feet for joints in toes (1990)   Family History: mom deceased age 45 06-01-22) - CAD, dementia dad deceased age 74 - asthma sister with breast cancer  Social History: quit smoking 2009 (35 pk yr hx) Marital Status: Married Children:  2 grown - son in [place] and dtr in [place] Occupation: homemaker Regular exercise-yes -  gardening occ EtOH  2-4 cups of coffee a day  Review of Systems       Pertinent positive and negative review of systems were noted in the above HPI and GI specific review of systems.  All other review of systems was otherwise negative.   Vital Signs:  Patient profile:   55 year old female Menstrual status:  regular Height:      63 inches Weight:      137 pounds BMI:     24.36 BSA:     1.65 Pulse rate:   98 / minute Pulse rhythm:   regular BP sitting:   102 / 72  (left arm)  Vitals Entered By: Genella Mech CMA ([place]) (February 21, 2009 1:40 PM)  Physical Exam  Additional Exam:  Constitutional: generally well appearing Psychiatric: alert and oriented times 3 Eyes: extraocular movements intact Mouth: oropharynx moist, no lesions Neck: supple, no lymphadenopathy Cardiovascular: heart regular rate and rythm Lungs: CTA bilaterally Abdomen: soft, non-tender, non-distended, no obvious ascites, no peritoneal signs, normal bowel sounds Extremities: no lower extremity edema bilaterally Skin: no lesions on visible extremities    Impression & Recommendations:  Problem # 1:  chronic diarrhea this has been a problem of hers for 10-20 years. I explained to her that it is very unlikely she has anything serious going on such as a cancer. She does however need to have full colonoscopy at her soonest convenience check for polyps,  cancers, colitis. I will plan to look at her terminal ileum as well. We will do a basic set of labs including a CBC, complete metabolic profile, sedimentation rate, thyroid testing, testing for celiac sprue. In the meantime she will begin taking one Imodium on a scheduled basis in addition to her as needed Imodium and Lomotil throughout the day.  Other Orders: TLB-Sedimentation Rate (ESR) (85652-ESR) TLB-CBC Platelet - w/Differential (85025-CBCD) TLB-CMP (Comprehensive Metabolic Pnl) (05183-FPOI) TLB-TSH (Thyroid Stimulating Hormone) (84443-TSH) T-Tissue  Transglutamase Ab IgA (51898-42103) TLB-IgA (Immunoglobulin A) (82784-IGA)  Patient Instructions: 1)  You will get lab test(s) done today (esr, cbc, cmet, tsh, tTG, total IgA). 2)  You will be scheduled to have a colonoscopy. 3)  In the meantime, take the immodium every morning shortly after waking. 4)  A copy of this information will be sent to Dr. Asa Lente. 5)  The medication list was reviewed and reconciled.  All changed / newly prescribed medications were explained.  A complete medication list was provided to the patient / caregiver.  Appended Document: Orders Update/movi    Clinical Lists Changes  Medications: Added new medication of MOVIPREP 100 GM  SOLR (PEG-KCL-NACL-NASULF-NA ASC-C) As per prep instructions. - Signed Rx of MOVIPREP 100 GM  SOLR (PEG-KCL-NACL-NASULF-NA ASC-C) As per prep instructions.;  #1 x 0;  Signed;  Entered by: Christian Mate CMA (AAMA);  Authorized by: Milus Banister MD;  Method used: Electronically to [id number] [address], [place], [place]  [address], Ph: [phone] or [phone], Fax: [phone] Orders: Added new Test order of Colonoscopy (Colon) - Signed    Prescriptions: MOVIPREP 100 GM  SOLR (PEG-KCL-NACL-NASULF-NA ASC-C) As per prep instructions.  #1 x 0   Entered by:   Christian Mate CMA ([place])   Authorized by:   Milus Banister MD   Signed by:   Christian Mate CMA ([place]) on 02/21/2009   Method used:   Electronically to        [id number] (retail)       [address]       [place], [place]  [address]       Ph: [phone] or [phone]       Fax: [phone]   RxID:   [id number]

## 2010-05-12 NOTE — Progress Notes (Signed)
Summary: MED PROBLEM  Phone Note Call from Patient   Summary of Call: Patient is requesting a call back about rx given impramine. Pt states that this is not working and was told if it didn't Dr would call in something else.  Initial call taken by: Lamar Sprinkles,  October 29, 2008 9:52 AM  Follow-up for Phone Call        reduce imipramine back to 25 mg nightly and start Zoloft 25 mg daily. will need a followup office visit next 2-4 weeks for further management to evaluate this new medication before titrating upward or making other changes. Thanks Follow-up by: Newt Lukes MD,  October 29, 2008 12:42 PM  Additional Follow-up for Phone Call Additional follow up Details #1::        left mess to call office back ...........................Marland KitchenLamar Sprinkles  October 29, 2008 3:50 PM     Additional Follow-up for Phone Call Additional follow up Details #2::    Pt informed, please call to schedule f/u in 2 to 4 wks THANKS!.........................Marland KitchenLamar Sprinkles  October 29, 2008 7:00 PM   New/Updated Medications: IMIPRAMINE HCL 50 MG TABS (IMIPRAMINE HCL) 1/2 (25mg ) by mouth at bedtime ZOLOFT 25 MG TABS (SERTRALINE HCL) 1 once daily Prescriptions: ZOLOFT 25 MG TABS (SERTRALINE HCL) 1 once daily  #30 x 1   Entered by:   Lamar Sprinkles   Authorized by:   Newt Lukes MD   Signed by:   Lamar Sprinkles on 10/29/2008   Method used:   [id number]       [address]       [place], [place]  [address]       Ph: [phone] or [phone]       Fax: [phone]   RxID:   [id number]

## 2010-05-12 NOTE — Progress Notes (Signed)
Summary: lomotil  Phone Note Refill Request Message from:  Fax from Pharmacy on December 16, 2009 10:10 AM  Refills Requested: Medication #1:  DIPHENOXYLATE-ATROPINE 2.5-0.025 MG TABS take 1 q 6 hours as needed for diarrhea # 30   Last [facility]: [facility] [phone] Last ov 12/03/09 Is this ok to refill?  Next Appointment Scheduled: None Initial call taken by: Orlan Leavens RMA,  December 16, 2009 10:11 AM  Follow-up for Phone Call        yes - ok to fill - thx Follow-up by: Newt Lukes MD,  December 16, 2009 10:28 AM  Additional Follow-up for Phone Call Additional follow up Details #1::        Faxed paper req back [facility] ok # 30 only. Updated EMR Additional Follow-up by: Orlan Leavens RMA,  December 16, 2009 11:27 AM    Prescriptions: DIPHENOXYLATE-ATROPINE 2.5-0.025 MG TABS (DIPHENOXYLATE-ATROPINE) take 1 q 6 hours as needed for diarrhea  #30 x 0   Entered by:   Orlan Leavens RMA   Authorized by:   Newt Lukes MD   Signed by:   Orlan Leavens RMA on 12/16/2009   Method used:   Historical   RxID:   [id number]

## 2010-05-12 NOTE — Progress Notes (Signed)
Summary: imipramine  Phone Note Refill Request Message from:  Fax from Pharmacy on January 31, 2009 3:38 PM  Refills Requested: Medication #1:  IMIPRAMINE HCL 50 MG TABS take 1 by mouth qhs   Last [facility]: [facility] [phone]   Method Requested: Electronic Initial call taken by: Orlan Leavens,  January 31, 2009 3:38 PM    Prescriptions: IMIPRAMINE HCL 50 MG TABS (IMIPRAMINE HCL) take 1 by mouth qhs  #30 x 3   Entered by:   Orlan Leavens   Authorized by:   Newt Lukes MD   Signed by:   Orlan Leavens on 01/31/2009   Method used:   [facility]        [facility] [id number]       [address]       [place], [place]  [address]       Ph: [phone] or [phone]       Fax: [phone]   RxID:   [id number]

## 2010-05-12 NOTE — Assessment & Plan Note (Signed)
Summary: diarrhea/#/cd   Vital Signs:  Patient profile:   55 year old female Menstrual status:  regular Height:      63 inches (160.02 cm) Weight:      134.6 pounds (61.18 kg) O2 Sat:      96 % Temp:     98.2 degrees F (36.78 degrees C) oral Pulse rate:   90 / minute BP sitting:   110 / 68  (left arm) Cuff size:   regular  Vitals Entered By: Orlan Leavens (January 08, 2009 10:59 AM) CC: diarrhea Is Patient Diabetic? No Pain Assessment Patient in pain? no      Comments Pt states been having diarrhea x's 14 days happen at night   Primary Care Provider:  Newt Lukes MD  CC:  diarrhea.  History of Present Illness: here today with complaints of diarrhea  . onset of current symptoms was 2 weeks ago. describes as "explosion of loose stool all during the night" noght symptoms much worse than daytime symptoms - only 1-2 stools in daytime gets up 5-6 times each night because or loose stool -  stools are liquid, no blood - black colored stool occ (becasue of pepto use?) symptoms associated with no abd pain, no fever - feels well  overall symptoms are similar to previous IBS symptoms - "but worse" symptoms improved by fasting . symptoms worsened with food/meals (starts 4-5h after supper). no change with immodium OTC stopped zoloft 1 week ago thinking new med may be causing the diarrhea - but not improved with stopping  re: depression -  stopped zoloft 1 week ago due to above inc imipramine dose as advised last OV, but still not sleeping not improved enery - feels tired all the time currenlt feels weak - but thinks its because of diarrhea wants to try new depression medication  Clinical Review Panels:  CBC   WBC:  7.5 (07/22/2008)   RBC:  4.61 (07/22/2008)   Hgb:  15.0 (07/22/2008)   Hct:  43.4 (07/22/2008)   Platelets:  315.0 (07/22/2008)   MCV  94.2 (07/22/2008)   MCHC  34.5 (07/22/2008)   RDW  12.9 (07/22/2008)   PMN:  55.6 (07/22/2008)   Lymphs:  34.9  (07/22/2008)   Monos:  5.7 (07/22/2008)   Eosinophils:  1.2 (07/22/2008)   Basophil:  2.6 (07/22/2008)   Current Medications (verified): 1)  Advil 200 Mg Tabs (Ibuprofen) .... Take Prn 2)  Imipramine Hcl 50 Mg Tabs (Imipramine Hcl) .... Take 1 By Mouth Qhs 3)  Zoloft 50 Mg Tabs (Sertraline Hcl) .Marland Kitchen.. 1 By Mouth Once Daily 4)  Vitamin D3 1000 Unit Caps (Cholecalciferol) .... Take 1 By Mouth Qd  Allergies (verified): 1)  ! Tetracycline  Past History:  Past Medical History: Reviewed history from 09/30/2008 and no changes required. Depression IBS - diarrhea predom Skin cancer - Squam cell ca - Dr. Max Fickle osteopenia  Review of Systems       The patient complains of depression.  The patient denies fever, weight loss, chest pain, syncope, abdominal pain, melena, hematochezia, and severe indigestion/heartburn.    Physical Exam  General:  alert, well-developed, well-nourished, and cooperative to examination.   spouse at side today Lungs:  normal respiratory effort, no intercostal retractions or use of accessory muscles; normal breath sounds bilaterally - no crackles and no wheezes.    Heart:  normal rate, regular rhythm, no murmur, and no rub. BLE without edema.  Abdomen:  soft, non-tender, normal bowel sounds, no distention; no  masses and no appreciable hepatomegaly or splenomegaly.   Psych:  Oriented X3, memory intact for recent and remote, normally interactive, fair eye contact, not anxious appearing, mildly depressed appearing, and not agitated.      Impression & Recommendations:  Problem # 1:  DIARRHEA (ICD-787.91)  ?IBS symptoms  doube med se of zoloft but ok to stop - see next also doubt infection but treat with 7days abx emperically given question of "bad egg salad" prior to onset of current flare also give as needed lomitil in place of OTC immodium or pepto   Her updated medication list for this problem includes:    Lomotil 2.5-0.025 Mg Tabs  (Diphenoxylate-atropine) .Marland Kitchen... 1 by mouth every 6 hours as needed diarrhea  Orders: Prescription Created Electronically [phone])  Problem # 2:  DEPRESSION (ICD-311)  will stop zoloft and try Paxil - (though may exac IBS symptoms so asked to not start until above issue improved) cont imipramine - consider adding trazadone to help with sleep as needed  followup next 4-8 weeks to continue dosing/med changes until we find a good fit Her updated medication list for this problem includes:    Imipramine Hcl 50 Mg Tabs (Imipramine hcl) .Marland Kitchen... Take 1 by mouth qhs    Paxil 20 Mg Tabs (Paroxetine hcl) .Marland Kitchen... 1 by mouth once daily  Orders: Prescription Created Electronically [phone])  Complete Medication List: 1)  Advil 200 Mg Tabs (Ibuprofen) .... Take prn 2)  Imipramine Hcl 50 Mg Tabs (Imipramine hcl) .... Take 1 by mouth qhs 3)  Paxil 20 Mg Tabs (Paroxetine hcl) .Marland Kitchen.. 1 by mouth once daily 4)  Vitamin D3 1000 Unit Caps (Cholecalciferol) .... Take 1 by mouth qd 5)  Metronidazole 250 Mg Tabs (Metronidazole) .Marland Kitchen.. 1 by mouth three times a day with food 6)  Lomotil 2.5-0.025 Mg Tabs (Diphenoxylate-atropine) .Marland Kitchen.. 1 by mouth every 6 hours as needed diarrhea  Patient Instructions: 1)  start antibiotics and use lomitil as directed for diarrhea 2)  Drink clear liquids only for the next 24 hours, then slowly add other liquids and food as you  tolerate them. 3)  the main problem with gastroenteritis is dehydration. Drink plenty of fluids and take solids as you feel better. If you are unable to keep anything down, call our office. 4)  stop zoloft 5)  when diarrhea improves, start paxil (generic) for depression and anxiety 6)  Please schedule a follow-up appointment in 2 months, sooner if problems. Prescriptions: LOMOTIL 2.5-0.025 MG TABS (DIPHENOXYLATE-ATROPINE) 1 by mouth every 6 hours as needed diarrhea  #30 x 0   Entered and Authorized by:   Newt Lukes MD   Signed by:   Newt Lukes MD on  01/08/2009   Method used:   Print then Give to Patient   RxID:   [id number] METRONIDAZOLE 250 MG TABS (METRONIDAZOLE) 1 by mouth three times a day with food  #21 x 0   Entered and Authorized by:   Newt Lukes MD   Signed by:   Newt Lukes MD on 01/08/2009   Method used:   [facility]  [address] [id number]       [address]       [place], [place]  [address]       Ph: [phone] or [phone]       Fax: [phone]   RxID:   [id number] PAXIL 20 MG TABS (  PAROXETINE HCL) 1 by mouth once daily  #30 x 2   Entered and Authorized by:   Newt Lukes MD   Signed by:   Newt Lukes MD on 01/08/2009   Method used:   [facility]        [facility] [id number]       [address]       [place], [place]  [address]       Ph: [phone] or [phone]       Fax: [phone]   RxID:   [id number]

## 2010-05-12 NOTE — Progress Notes (Signed)
Summary: Lomotil  Phone Note Refill Request Message from:  Fax from Pharmacy on October 15, 2009 1:48 PM  Refills Requested: Medication #1:  Diphen/atropine tab # 30 take 1 q 6 hours prn for diarrhea   Last Ref[facility]: [facility] [phone] Last ov 10/01/09  Next Appointment Scheduled: 12/03/09 Initial call taken by: Orlan Leavens,  October 15, 2009 1:49 PM  Follow-up for Phone Call        Med was d/c from med list back in January. do you want to ok refill? Follow-up by: Orlan Leavens,  October 15, 2009 1:52 PM  Additional Follow-up for Phone Call Additional follow up Details #1::        yes, ok to refill as prev rx'd -thanks Additional Follow-up by: Newt Lukes MD,  October 15, 2009 2:04 PM    Additional Follow-up for Phone Call Additional follow up Details #2::    [facility]. updated EMR Follow-up by: Orlan Leavens,  October 15, 2009 2:52 PM  New/Updated Medications: DIPHENOXYLATE-ATROPINE 2.5-0.025 MG TABS (DIPHENOXYLATE-ATROPINE) take 1 q 6 hours as needed for diarrhea Prescriptions: DIPHENOXYLATE-ATROPINE 2.5-0.025 MG TABS (DIPHENOXYLATE-ATROPINE) take 1 q 6 hours as needed for diarrhea  #30 x 0   Entered by:   Orlan Leavens   Authorized by:   Newt Lukes MD   Signed by:   Orlan Leavens on 10/15/2009   Method used:   Historical   RxID:   [id number]

## 2010-05-14 NOTE — Progress Notes (Signed)
  Phone Note Other Incoming   Request: Send information Summary of Call: Request received from Disability [employer] forwarded to [employer].       

## 2010-05-14 NOTE — Progress Notes (Signed)
Summary: Rx refill req  Phone Note Refill Request Message from:  Patient on April 08, 2010 9:37 AM  Refills Requested: Medication #1:  ALPRAZOLAM 0.5 MG TABS 1 by mouth at bedtime as needed # 30   Dosage confirmed as above?Dosage Confirmed [facility] Last ov 12/03/09 Is this ok to refill?  Next Appointment Scheduled: none Initial call taken by: Orlan Leavens RMA,  April 08, 2010 10:36 AM  Follow-up for Phone Call        ok to fill as prev rx'd - thanks Follow-up by: Newt Lukes MD,  April 08, 2010 11:49 AM  Additional Follow-up for Phone Call Additional follow up Details #1::        [facility] Updated emr Additional Follow-up by: Orlan Leavens RMA,  April 08, 2010 12:09 PM    Prescriptions: ALPRAZOLAM 0.5 MG TABS (ALPRAZOLAM) 1 by mouth at bedtime as needed  #30 x 3   Entered by:   Orlan Leavens RMA   Authorized by:   Newt Lukes MD   Signed by:   Orlan Leavens RMA on 04/08/2010   Method used:   Historical   RxID:   [id number]

## 2010-05-14 NOTE — Progress Notes (Signed)
Summary: lomotil  Phone Note Refill Request Message from:  Fax from Pharmacy on April 14, 2010 10:36 AM  Refills Requested: Medication #1:  DIPHENOXYLATE-ATROPINE 2.5-0.025 MG TABS take 1 q 6 hours as needed for diarrhea # 30   Last Refilled: [facility]/13/2010 [facility] [phone] Last ov 12/03/09 Is this ok to refill?   Method Requested: Fax to Local Pharmacy Next Appointment Scheduled: none Initial call taken by: Orlan Leavens RMA,  April 14, 2010 10:37 AM  Follow-up for Phone Call        ok to fill and fax as requested Follow-up by: Newt Lukes MD,  April 14, 2010 11:08 AM  Additional Follow-up for Phone Call Additional follow up Details #1::        [facility] Additional Follow-up by: Orlan Leavens RMA,  April 14, 2010 11:54 AM    Prescriptions: DIPHENOXYLATE-ATROPINE 2.5-0.025 MG TABS (DIPHENOXYLATE-ATROPINE) take 1 q 6 hours as needed for diarrhea  #30 x 1   Entered and Authorized by:   Newt Lukes MD   Signed by:   Newt Lukes MD on 04/14/2010   Method used:   Printed then faxed to ...       [facility]  [address] [id number]       [address]       [place], [place]  [address]       Ph: [phone] or [phone]       Fax: [phone]   RxID:   [id number]

## 2010-05-14 NOTE — Progress Notes (Signed)
Summary: Test results  Phone Note Call from Patient Call back at Home Phone [phone]   Call For: Monique Swanson  Reason for Call: Lab or Test Results Summary of Call: Pt wanted to know her stool study results.  I did give her the negative results.  She thanked me for calling. Initial call taken by: Leanor Kail [facility] [place] [facility],  April 22, 2010 11:50 AM

## 2010-06-01 ENCOUNTER — Telehealth: Payer: Self-pay | Admitting: Internal Medicine

## 2010-06-09 ENCOUNTER — Ambulatory Visit: Payer: Self-pay | Admitting: Internal Medicine

## 2010-06-09 NOTE — Progress Notes (Signed)
Summary: imipramine  Phone Note Refill Request Message from:  Fax from Pharmacy on June 01, 2010 3:19 PM  Refills Requested: Medication #1:  IMIPRAMINE HCL 50 MG TABS take 1 by mouth qhs [facility]   Method Requested: Fax to Local Pharmacy Initial call taken by: Orlan Leavens RMA,  June 01, 2010 3:19 PM  Follow-up for Phone Call        Faxed paper request back denied pt is overdue for physical appt. Must see md before renewal Follow-up by: Orlan Leavens RMA,  June 01, 2010 3:20 PM

## 2010-06-12 ENCOUNTER — Ambulatory Visit (INDEPENDENT_AMBULATORY_CARE_PROVIDER_SITE_OTHER): Payer: Self-pay | Admitting: Internal Medicine

## 2010-06-12 ENCOUNTER — Encounter: Payer: Self-pay | Admitting: Internal Medicine

## 2010-06-12 DIAGNOSIS — F3289 Other specified depressive episodes: Secondary | ICD-10-CM

## 2010-06-12 DIAGNOSIS — R197 Diarrhea, unspecified: Secondary | ICD-10-CM

## 2010-06-12 DIAGNOSIS — H6690 Otitis media, unspecified, unspecified ear: Secondary | ICD-10-CM | POA: Insufficient documentation

## 2010-06-12 DIAGNOSIS — F329 Major depressive disorder, single episode, unspecified: Secondary | ICD-10-CM

## 2010-06-18 NOTE — Assessment & Plan Note (Signed)
Summary: MED REVIEW / LOOSING WEIGHT /NWS  #   Vital Signs:  Patient profile:   55 year old female Menstrual status:  regular Weight:      122.6 pounds (55.73 kg) O2 Sat:      96 % on Room air Temp:     98.6 degrees F (37.00 degrees C) oral Pulse rate:   90 / minute BP sitting:   112 / 82  (left arm) Cuff size:   regular  Vitals Entered By: Orlan Leavens RMA (June 12, 2010 1:34 PM)  O2 Flow:  Room air CC: Discuss meds Is Patient Diabetic? No Pain Assessment Patient in pain? no        Primary Care Provider:  Newt Lukes MD  CC:  Discuss meds.  History of Present Illness: c/o cough and ear pain x 1 week  diarrhea - improved but not resolved - worse with emotional stress taking lomotil  which helps - but upto 10/d no abd pain - no n/v occ blood (BRBPR) mixed with stool, nonpainful dx and tx ongoing for microscopic colitis-  depression - exac by recent sudden death of spouse 22-Aug-2009 6wk between his dx and death related to met panc cancer now death of sister after prolobged illness 05/2010 no adv SE with paxil - feels well on this - not in counseling at this time - unable to afford  arthritis - now using aleve in place of advil with good results - no swelling in joints   Clinical Review Panels:  CBC   WBC:  7.1 (03/02/2010)   RBC:  4.60 (03/02/2010)   Hgb:  15.6 (03/02/2010)   Hct:  44.3 (03/02/2010)   Platelets:  324.0 (03/02/2010)   MCV  96.2 (03/02/2010)   MCHC  35.2 (03/02/2010)   RDW  13.7 (03/02/2010)   PMN:  59.9 (03/02/2010)   Lymphs:  29.7 (03/02/2010)   Monos:  8.5 (03/02/2010)   Eosinophils:  0.7 (03/02/2010)   Basophil:  1.2 (03/02/2010)  Complete Metabolic Panel   Glucose:  102 (03/02/2010)   Sodium:  139 (03/02/2010)   Potassium:  4.1 (03/02/2010)   Chloride:  102 (03/02/2010)   CO2:  28 (03/02/2010)   BUN:  10 (03/02/2010)   Creatinine:  0.9 (03/02/2010)   Albumin:  3.7 (09/26/2009)   Total Protein:  6.6 (09/26/2009)  Calcium:  9.1 (03/02/2010)   Total Bili:  0.4 (09/26/2009)   Alk Phos:  95 (09/26/2009)   SGPT (ALT):  11 (09/26/2009)   SGOT (AST):  21 (09/26/2009)   Current Medications (verified): 1)  Imipramine Hcl 50 Mg Tabs (Imipramine Hcl) .... Take 1 By Mouth Qhs 2)  Paroxetine Hcl 40 Mg Tabs (Paroxetine Hcl) .Marland Kitchen.. 1 By Mouth Once Daily 3)  Alprazolam 0.5 Mg Tabs (Alprazolam) .Marland Kitchen.. 1 By Mouth At Bedtime As Needed 4)  Imodium A-D 2 Mg Tabs (Loperamide Hcl) .... Take 4 Tablets  Every Morning and 4 Tablets Every Evening 5)  Entocort Ec 3 Mg Xr24h-Cap (Budesonide) .... 3 Capsules By Mouth Once Daily 6)  Tylenol Extra Strength 500 Mg Tabs (Acetaminophen) .... 2 Tablets By Mouth Once Daily 7)  Diphenoxylate-Atropine 2.5-0.025 Mg Tabs (Diphenoxylate-Atropine) .... Take 1 Q 6 Hours As Needed For Diarrhea 8)  Trazodone Hcl 50 Mg Tabs (Trazodone Hcl) .... 1/2-1 By Mouth At Bedtime 9)  Clonazepam 0.5 Mg Tabs (Clonazepam) .... 1/4 in The Morning and 1 Tablets By Mouth Every Evening 10)  Dicyclomine Hcl 10 Mg Caps (Dicyclomine Hcl) .... Take 1 Tab  4 Times Daily For Cramping  Allergies (verified): 1)  ! Tetracycline  Past History:  Past Medical History: Depression microscopic colitis - chronic diarrhea Skin cancer - Squam cell ca  osteopenia     MD roster: GI - jacobs derm - clyde nolan  Social History: quit smoking 2009 (35 pk yr hx)  widowed 08/24/2009 -  Children:  2 grown - son in [place] and dtr in [place] Occupation: homemaker Regular exercise-yes - gardening occ EtOH 2-4 cups of coffee a day  Review of Systems       The patient complains of weight loss.  The patient denies chest pain, headaches, and abdominal pain.    Physical Exam  General:  alert, well-developed, well-nourished, and cooperative to examination. very emotional Ears:  B red/injected TMs with effusion Mouth:  teeth and gums in good repair; mucous membranes moist, without lesions or ulcers. oropharynx clear without  exudate, no erythema.  Lungs:  normal respiratory effort, no intercostal retractions or use of accessory muscles; normal breath sounds bilaterally - no crackles and no wheezes.    Heart:  normal rate, regular rhythm, no murmur, and no rub. BLE without edema.  Psych:  Oriented X3, memory intact for recent and remote, normally interactive, fair eye contact, mildly anxious appearing, mod depressed appearing with tearfulness during hx/exam,  not agitated.      Impression & Recommendations:  Problem # 1:  OTITIS MEDIA, ACUTE (ICD-382.9)  Her updated medication list for this problem includes:    Tylenol Extra Strength 500 Mg Tabs (Acetaminophen) .Marland Kitchen... 2 tablets by mouth once daily    Azithromycin 250 Mg Tabs (Azithromycin) .Marland Kitchen... 2 tabs by mouth today, then 1 by mouth daily starting tomorrow  Instructed on prevention and treatment. Call if no improvement in 48-72 hours or sooner if worsening symptoms.   Problem # 2:  DIARRHEA (ICD-787.91)  Her updated medication list for this problem includes:    Imodium A-d 2 Mg Tabs (Loperamide hcl) .Marland Kitchen... Take 4 tablets  every morning and 4 tablets every evening    Diphenoxylate-atropine 2.5-0.025 Mg Tabs (Diphenoxylate-atropine) .Marland Kitchen... Take 1 q 6 hours as needed for diarrhea  improved - but still taking lomotil - suspect IBS exac microscopic colitis  Problem # 3:  DEPRESSION (ICD-311) no med change at this time but consider future wean as able Her updated medication list for this problem includes:    Imipramine Hcl 50 Mg Tabs (Imipramine hcl) .Marland Kitchen... Take 1 by mouth qhs    Paroxetine Hcl 40 Mg Tabs (Paroxetine hcl) .Marland Kitchen... 1 by mouth once daily    Alprazolam 0.5 Mg Tabs (Alprazolam) .Marland Kitchen... 1 by mouth at bedtime as needed    Trazodone Hcl 50 Mg Tabs (Trazodone hcl) .Marland Kitchen... 1/2-1 by mouth at bedtime    Clonazepam 0.5 Mg Tabs (Clonazepam) .Marland Kitchen... 1/4 in the morning and 1 tablets by mouth every evening  Complete Medication List: 1)  Imipramine Hcl 50 Mg Tabs  (Imipramine hcl) .... Take 1 by mouth qhs 2)  Paroxetine Hcl 40 Mg Tabs (Paroxetine hcl) .Marland Kitchen.. 1 by mouth once daily 3)  Alprazolam 0.5 Mg Tabs (Alprazolam) .Marland Kitchen.. 1 by mouth at bedtime as needed 4)  Imodium A-d 2 Mg Tabs (Loperamide hcl) .... Take 4 tablets  every morning and 4 tablets every evening 5)  Entocort Ec 3 Mg Xr24h-cap (Budesonide) .... 3 capsules by mouth once daily 6)  Tylenol Extra Strength 500 Mg Tabs (Acetaminophen) .... 2 tablets by mouth once daily 7)  Diphenoxylate-atropine 2.5-0.025 Mg Tabs (  Diphenoxylate-atropine) .... Take 1 q 6 hours as needed for diarrhea 8)  Trazodone Hcl 50 Mg Tabs (Trazodone hcl) .... 1/2-1 by mouth at bedtime 9)  Clonazepam 0.5 Mg Tabs (Clonazepam) .... 1/4 in the morning and 1 tablets by mouth every evening 10)  Dicyclomine Hcl 10 Mg Caps (Dicyclomine hcl) .... Take 1 tab 4 times daily for cramping 11)  Azithromycin 250 Mg Tabs (Azithromycin) .... 2 tabs by mouth today, then 1 by mouth daily starting tomorrow 12)  Promethazine-codeine 6.25-10 Mg/1ml Syrp (Promethazine-codeine) .... 5cc by mouth every 4 hours as needed for cough 13)  Prednisone 10 Mg Tabs (Prednisone) .... Take by mouth day 1: 6 tablets; day 2: 5 tablets; day 3: 4 tablets;  day 4: 3 tablets; day 5: 2 tablets; day 6: 1 tablet  Patient Instructions: 1)  it was good to see you today. 2)  continue same medications + antibiotics, prednisone and cough syrup for infection 3)  your prescriptions have been submitted to your pharmacy. Please take as directed. Contact our office if you believe you're having problems with the medication(s).  4)  Please schedule a follow-up appointment in 6 months to review depression and medications, call sooner if problems. Prescriptions: PREDNISONE 10 MG TABS (PREDNISONE) take by mouth day 1: 6 tablets; day 2: 5 tablets; day 3: 4 tablets;  day 4: 3 tablets; day 5: 2 tablets; day 6: 1 tablet  #18 x 0   Entered and Authorized by:   Newt Lukes MD   Signed  by:   Newt Lukes MD on 06/12/2010   Method used:   [facility]        [facility]  [address] [id number]       [address]       [place], [place]  [address]       Ph: [phone] or [phone]       Fax: [phone]   RxID:   [id number] PROMETHAZINE-CODEINE 6.25-10 MG/5ML SYRP (PROMETHAZINE-CODEINE) 5cc by mouth every 4 hours as needed for cough  #6 oz x 0   Entered and Authorized by:   Newt Lukes MD   Signed by:   Newt Lukes MD on 06/12/2010   Method used:   Printed then faxed to ...       [facility]  [address] [id number]       [address]       [place], [place]  [address]       Ph: [phone] or [phone]       Fax: [phone]   RxID:   [id number] AZITHROMYCIN 250 MG TABS (AZITHROMYCIN) 2 tabs by mouth today, then 1 by mouth daily starting tomorrow  #6 x 0   Entered and Authorized by:   Newt Lukes MD   Signed by:   Newt Lukes MD on 06/12/2010   Method used:   [facility]ronically to        [facility]  [address] [id number]       [address]       [place], [place]  [address]       Ph: [phone] or [phone]       Fax: [phone]   RxID:   [id number] DIPHENOXYLATE-ATROPINE 2.5-0.025 MG TABS (DIPHENOXYLATE-ATROPINE) take 1 q 6 hours as needed for diarrhea  #30 x 6   Entered and  Authorized by:   Newt Lukes MD   Signed by:   Newt Lukes MD on 06/12/2010   Method used:   Printed then faxed to ...       [facility]  [address] [id number]       [address]       [place], [place]  [address]       Ph: [phone] or [phone]       Fax: [phone]   RxID:   [id number] TRAZODONE HCL 50 MG TABS (TRAZODONE HCL) 1/2-1 by mouth at bedtime  #30 x 11   Entered and Authorized by:   Newt Lukes MD   Signed by:   Newt Lukes MD on 06/12/2010   Method used:   [facility]ronically [facility]        [facility]  [address]  [id number]       [address]       [place], [place]  [address]       Ph: [phone] or [phone]       Fax: [phone]   RxID:   [id number] PAROXETINE HCL 40 MG TABS (PAROXETINE HCL) 1 by mouth once daily  #30 Tablet x 11   Entered and Authorized by:   Newt Lukes MD   Signed by:   Newt Lukes MD on 06/12/2010   Method used:   [facility]ronically to        [facility]  [address] [id number]       [address]       [place], [place]  [address]       Ph: [phone] or [phone]       Fax: [phone]   RxID:   [id number] IMIPRAMINE HCL 50 MG TABS (IMIPRAMINE HCL) take 1 by mouth qhs  #30 Tablet x 11   Entered and Authorized by:   Newt Lukes MD   Signed by:   Newt Lukes MD on 06/12/2010   Method used:   [facility]ronically to        [facility]  [address] [id number]       [address]       [place], [place]  [address]       Ph: [phone] or [phone]       Fax: [phone]   RxID:   [id number]    Orders Added: 1)  Est. Patient Level III [998[address]]

## 2010-06-25 ENCOUNTER — Telehealth: Payer: Self-pay | Admitting: Internal Medicine

## 2010-06-30 NOTE — Progress Notes (Signed)
Summary: promethazine cough syrup  Phone Note Refill Request Message from:  Fax from Pharmacy on June 25, 2010 11:57 AM  Refills Requested: Medication #1:  PROMETHAZINE-CODEINE 6.25-10 MG/5ML SYRP 5cc by mouth every 4 hours as needed for cough   Last Ref[facility]: [facility]12/[facility]   Method Requested: Electronic Initial call taken by: Orlan Leavens RMA,  June 25, 2010 11:57 AM  Follow-up for Phone Call        Denied refill. Faxed back paper request need ov for follow-up Follow-up by: Orlan Leavens RMA,  June 25, 2010 11:58 AM

## 2010-08-28 NOTE — H&P (Signed)
Monique Swanson, Monique Swanson                            ACCOUNT NO.:  [id number]   MEDICAL RECORD NO.:  [id number]                   PATIENT TYPE:  INP   LOCATION:  5023                                 FACILITY:  [facility]   PHYSICIAN:  Jerelene Redden, MD                   DATE OF BIRTH:  Jun 09, 1955   DATE OF ADMISSION:  08/27/2002  DATE OF DISCHARGE:                                HISTORY & PHYSICAL   PROBLEM LIST:  1. Hematochezia.  2. Irritable bowel syndrome.  3. History of migraine headaches.  4. A 60 pack year smoking history.  5. Possible alcohol abuse.  6. History of hysterectomy.   HISTORY OF PRESENT ILLNESS:  This 55 year old woman presents with a 24 hour  history of frequent loose bloody stools with associated mild left lower  quadrant pain.  She has not had any stool since 9 p.m.  She has no previous  history of gastrointestinal bleeding.  She states that she has been told in  the past that she has irritable bowel syndrome, and recalls that she had a  barium enema done about 10 years ago which was normal.  She has been placed  on Imipramine 100 mg daily for treatment of this problem.  She points out  that she takes ibuprofen eight tablets a day for chronic headache, and that  she also drinks about four beers per day.  She does not recall having had a  colonoscopy in the past.  She is admitted at this time for observation and  follow up of gastrointestinal bleeding and GI consult.   MEDICATIONS:  1. Imipramine 100 mg daily.  2. Ibuprofen six to eight tablets daily.   PAST SURGICAL HISTORY:  1. Hysterectomy 15 years ago.  2. History of arthroscopy of the knee.   PAST MEDICAL HISTORY:  None.   FAMILY HISTORY:  Her father died of an asthma attack.  Her brother died of a  ruptured aneurysm.  Her mother has a history of congestive heart failure and  chronic obstructive pulmonary disease.  She has a sister who has chronic  diarrhea.   SOCIAL HISTORY:  The patient states that  she smokes two packs of cigarettes  per day, she drinks four to five beers per day.  She denies any history of  drug abuse.  She states that her son recently died of cancer, and she has  been involved in several arguments with her pregnant daughter.  As a result  of this, she feels that she is under a great deal of stress.  She has  chronic morning headaches.   REVIEW OF SYMPTOMS:  HEAD:  See above.  EYES:  She denies visual blurring or  diplopia.  EARS, NOSE, AND THROAT:  She denies earaches, sinus pain, or sore  throat.  CHEST:  She denies coughing, wheezing, or chest congestion.  CARDIOVASCULAR:  She denies  orthopnea, PND, or ankle edema.  GASTROINTESTINAL:  See above.  GENITOURINARY:  She denies dysuria or urinary  frequency, hesitancy, or nocturia.  She is status post hysterectomy.  NEUROLOGIC:  She denies seizures or stroke.  ENDOCRINE:  She denies  excessive thirst, urinary frequency, or nocturia.   PHYSICAL EXAMINATION:  GENERAL:  She is a pleasant alert lady.  VITAL SIGNS:  Blood pressure is 117/50, pulse 70.  HEENT:  Within normal limits.  CHEST:  Clear.  CARDIOVASCULAR:  Normal S1 and S2, no murmurs, rubs, or gallops.  BACK:  No CVA or point tenderness.  ABDOMEN:  Benign.  There are normal bowel sounds.  There is no guarding or  rebound.  There is very mild left lower quadrant discomfort.  RECTAL:  The stool is heme positive.  NEUROLOGIC:  Normal.  EXTREMITIES:  Normal.   LABORATORY DATA:  Liver profile is normal.  PT and PTT are normal.  Glucose  is 75, hemoglobin 15.9.   ASSESSMENT AND PLAN:  1. We will admit for observation to monitor hemoglobin and hematocrit.  We     will administer intravenous fluids.  2. A GI consult will be requested for colonoscopy.  3. We will watch her for signs of alcohol withdrawal.  4. The patient will be given thymine.  5. We will __________.                                               Jerelene Redden, MD    SY/MEDQ  D:   08/28/2002  T:  08/28/2002  Job:  [id number]   cc:   Herbie Baltimore A. Alyson Ingles, M.D.  [address]  [place] [address]  Fax: [phone]

## 2010-08-28 NOTE — Discharge Summary (Signed)
NAMEJAKERRIA, KINGBIRD                            ACCOUNT NO.:  [id number]   MEDICAL RECORD NO.:  [id number]                   PATIENT TYPE:  INP   LOCATION:  5023                                 FACILITY:  [facility]   PHYSICIAN:  Ashby Dawes. Polite, M.D.              DATE OF BIRTH:  March 19, 1956   DATE OF ADMISSION:  08/27/2002  DATE OF DISCHARGE:                                 DISCHARGE SUMMARY   PRIMARY CARE PHYSICIAN:  Dr. Audree Camel. Reade.   GASTROENTEROLOGIST:  Dr. Ronald Lobo.   DISCHARGE DIAGNOSES:  1. Resolving ischemic colitis.  2. Migraine headaches.  3. Tobacco abuse.  4. Irritable bowel syndrome.  5. Possible alcohol abuse.  6. Depression.   DISCHARGE MEDICATIONS:  1. Protonix 40 mg daily.  2. Imipramine 100 mg daily.  3. Percocet 5/325 mg one to two tablets every four hours as needed.   CONSULTS:  Dr. Ronald Lobo, gastroenterology.   PROCEDURES AND STUDIES:  Flexible sigmoidoscopy, Aug 29, 2002, revealed  segmental colitis consistent with resolving ischemic colitis at 25 to 30 cm,  old mucoid blood, no free blood, otherwise, normal.   DISCHARGE LABORATORY DATA:  WBC 9.5, hemoglobin 14.1, hematocrit 40.7,  platelets 299,000.  Sodium 141, potassium 3.6, chloride 109, CO2 26, BUN 5,  creatinine 0.7, glucose 81.  Stool for ova, parasites and C. difficile  pending at discharge.   DISPOSITION:  The patient is being discharged home.   CONDITION ON DISCHARGE:  Stable.   HISTORY OF PRESENT ILLNESS:  This is a 55 year old female who presented to  [facility] with a 24-hour history of frequent loose bloody stools associated  with mid left lower quadrant pain.  The patient has no previous history of  gastrointestinal bleeding.  She does report being told in the past that she  has irritable bowel syndrome.  She recalls having a barium enema done about  10 years ago which was normal and the patient had been placed on imipramine  100 mg daily for treatment of this problem.   The patient reports taking up  to eight 200 mg ibuprofens a day for chronic headaches and she also drinks  about four beers per day.  She does not recall having a colonoscopy in the  past.  Her admission vital signs were stable.  Hemoglobin was 15.9.  The  patient was admitted for further evaluation and treatment.   HOSPITAL COURSE:  #1 - ISCHEMIC COLITIS:  The patient was admitted, put on a  clear liquid diet.  A GI consult was obtained.  Occult blood checks were  ordered.  Serial CBCs were done.  She was seen in consultation by Dr.  Cristina Gong.  A flexible sigmoidoscopy was done on the day of discharge, with  the results as noted above.  Recommendations were to manage symptoms,  advance her diet and discharge, would maintain the patient on PPI therapy  indefinitely  and smoking cessation was advised.  No additional GI followup  was recommended.  At discharge, the patient has had no rectal bleeding for  approximately 24 hours.  Her hemoglobin is 14.1.   #2 - CHRONIC MIGRAINE HEADACHES:  The patient reports a history of chronic  headaches and taking up to eight 200 mg ibuprofen tablets daily for these.  The patient was advised to avoid all NSAIDs and aspirin products and to take  Tylenol.  She was discharged with a limited amount of Percocet for her  headaches.  Additional followup and treatment will be left to her primary  care physician.   #3 - POSSIBLE ALCOHOL ABUSE:  The patient reports drinking four beers a day.  She has recently lost a son to cancer.  She had no signs or symptoms of  withdrawals during her hospitalization.   #4 - TOBACCO ABUSE:  The patient was advised to quit smoking.  She was  provided with a nicotine patch during her hospitalization.   DISCHARGE INSTRUCTIONS AND FOLLOWUP:  The patient should follow up with her  primary care physician, Dr. Maury Dus, in approximately two weeks.     Stephanie Martinique, NP                      Ashby Dawes. Polite, M.D.     SJ/MEDQ  D:  08/29/2002  T:  08/30/2002  Job:  [id number]   cc:   Herbie Baltimore A. Alyson Ingles, M.D.  [address] [place]  [place]  [place] [address]  Fax: [phone]   Ronald Lobo, M.D.  [address]  [place], [place] [address]  Fax: [phone]

## 2010-08-28 NOTE — Consult Note (Signed)
NAMECHABLIS, LOSH                            ACCOUNT NO.:  [id number]   MEDICAL RECORD NO.:  [id number]                   PATIENT TYPE:  INP   LOCATION:  5023                                 FACILITY:  [facility]   PHYSICIAN:  Ronald Lobo, M.D.                DATE OF BIRTH:  June 16, 1955   DATE OF CONSULTATION:  08/28/2002  DATE OF DISCHARGE:                                   CONSULTATION   GASTROENTEROLOGY CONSULTATION:   REASON FOR CONSULTATION:  Dr. Ashby Dawes. Polite of the [facility] hospitalist  asked me to see this 55 year old Caucasian female because of rectal  bleeding.   HISTORY:  The patient was admitted to the hospital late last night or early  this morning because of a roughly 24 hour history of diarrhea which began  around 1 a.m. Monday, and became bloody on and off the next morning and on  into the next afternoon, with probably a total of about 10 or more bowel  movements.  With these bowel movements would be passage of small globs of  coagulated bright red blood which did not color the toilet water red.  The  patient does take large amounts of ibuprofen but has no prior history of  ulcers or GI bleeding.   Since being in the hospital, she has maintained a fairly stable hemoglobin  and has had essentially no further diarrhea or bleeding for the past 12 to  24 hours.   ALLERGIES:  To TETRACYCLINE (oral ulcerations.   OUTPATIENT MEDICATIONS:  Imipramine for irritable bowel syndrome and  depression, ibuprofen (6 to 9 tablets per day) taking for headache she gets  each morning.   PAST SURGICAL HISTORY:  Operations include a TAH with unilateral salpingo-  oophorectomy some years ago.   PAST MEDICAL ILLNESSES:  Depression, chronic headaches.  No ulcers or  cardiopulmonary disease, diabetes or hypertension.   HABITS:  The patient smokes two packs per day and drinks four to five beers  a day most days out of the week.   FAMILY HISTORY:  Negative for colon cancer,  inflammatory bowel disease,  ulcers, or gallbladder disease.   SOCIAL HISTORY:  Married for 22 years.  Sells books over the Internet.  Under significant stress in that her stepson died about a month ago.   PHYSICAL EXAMINATION:  GENERAL:  This is a thin, slightly anxious appearing  female in no acute distress.  VITAL SIGNS:  Afebrile.  Blood pressure 102/42, pulse 98.  HEENT:  Anicteric.  No frank pallor.  Oropharynx benign.  CHEST:  Clear anteriorly.  HEART:  Without gallops, rubs, murmurs, clicks or arrhythmias.  ABDOMEN:  Positive bowel sounds, nondistended, soft and without  organomegaly, guarding, masses or tenderness.  Although she does report some  lower abdominal cramps.  RECTAL:  Shows what feels like clamped-down rectal ampulla.  No masses.  No  obvious perianal disease and mucoid  red strongly hemoccult positive stool  residue.   LABORATORY DATA:  Renal function normal.  Current hemoglobin has drifted  down from 15.7 to 14.4 over the past 24 hours.  BUN 5.   IMPRESSION:  1. Nondestabilizing lower gastrointestinal bleeding probably due to mucosal     rather than arterial hemorrhage.  The differential diagnoses for this     would include acute infectious dysenteric diarrhea, ischemic colitis     especially in view of the patient's significant smoking history, new     onset ulcerative colitis or proctitis, or neoplasia (doubt).  2. Excessive nonsteroidal anti-inflammatory drug exposure.   RECOMMENDATIONS:  1. Unprepped flexible sigmoidoscopy tomorrow.  The nature, purpose and risks     of this and possible colonoscopy (if the flexible sigmoidoscopy is     unrevealing) were discussed with the patient and her husband who is at     the bedside and they are very agreeable to proceed.  2. The patient should probably be started and maintained on a low-cost PPI     agent independently as long as she maintains her current exposures of     nicotine and NSAIDs and alcohol.                                                Ronald Lobo, M.D.    RB/MEDQ  D:  08/29/2002  T:  08/29/2002  Job:  [id number]   cc:   Herbie Baltimore A. Alyson Ingles, M.D.  [place]  [place] [address]  Fax: [phone]

## 2010-08-28 NOTE — Op Note (Signed)
   Monique Swanson, Monique Swanson                            ACCOUNT NO.:  [id number]   MEDICAL RECORD NO.:  [id number]                   PATIENT TYPE:  INP   LOCATION:  5023                                 FACILITY:  [facility]   PHYSICIAN:  Ronald Lobo, M.D.                DATE OF BIRTH:  30-Jan-1956   DATE OF PROCEDURE:  08/29/2002  DATE OF DISCHARGE:                                 OPERATIVE REPORT   PROCEDURE:  Flexible sigmoidoscopy with biopsies.   INDICATIONS FOR PROCEDURE:  Bloody diarrhea and abdominal cramps in a 55-  year-old smoker.   FINDINGS:  Short segment ischemic colitis in the sigmoid region.   DESCRIPTION OF PROCEDURE:  The nature, purpose, and risks of the procedure  had been discussed with the patient who provided written consent and was  brought from her hospital room. The procedure was done unprepped in view of  her history of significant diarrhea. Sedation was fentanyl 100 mcg and  Versed 10 mg IV prior to and during the course of the procedure without  arrhythmias or desaturation.  We used the Olympus adjustable tension  pediatric video colonoscope which was advanced without significant  difficulty to about 45 cm, whereupon pullback was performed.   On the way in, in the sigmoid region, I encountered a segment measuring  about 5 to 10 cm in length, extending up to about 30 cm, fairly classic for  ischemic colitis characterized by linear and noncircumferential exudate and  loss of vascularity, without mucosal abnormalities in the colon proximal to  that region (up to the limit of the examination) or more distally in the  rectum and distal sigmoid.  There was some mucoid dried up blood adherent to  the rectal wall here and there where as proximally, there was formed brown  stool. There was no free blood in the rectal lumen.  Overall, this had the  appearance of resolving segmental ischemic colitis.  Multiple biopsies were  obtained from the inflamed segment.   No  polyps, cancer, diverticular disease, or vascular ectasia were noted.  Retroflexion was not performed.   The patient tolerated the procedure well and there were no apparent  complications.   IMPRESSION:  Segmental colitis most consistent, endoscopically, with  ischemic colitis.   PLAN:  Await pathology on biopsies.                                               Ronald Lobo, M.D.   RB/MEDQ  D:  08/29/2002  T:  08/29/2002  Job:  [id number]   cc:   Herbie Baltimore A. Alyson Ingles, M.D.  [place]  [place] [address]  Fax: [phone]

## 2010-08-28 NOTE — Op Note (Signed)
[facility]  Patient:    Monique Swanson, Monique Swanson                         MRN: [id number] Proc. Date: 07/26/00 Adm. Date:  [id number] Attending:  Melrose Nakayama                           Operative Report  PREOPERATIVE DIAGNOSIS:  Right knee chondromalacia of the patella.  POSTOPERATIVE DIAGNOSES: 1. Right knee chondromalacia of the patella. 2. Right knee loose bodies.  PROCEDURES: 1. Right knee chondroplasty of the patella. 2. Right knee removal of loose body.  ANESTHESIA:  General.  SURGEON:  Monico Blitz. Rhona Raider, M.D.  ASSISTANT:  Dione Housekeeper, P.A.  INDICATION FOR PROCEDURE:  The patient is a 55 year old woman with a long history of right knee pain.  She was seen in the office and offered the choice between physical therapy and arthroscopy.  She wished to go ahead with an arthroscopy.  The procedure was discussed with the patient, and informed operative consent was obtained after discussion of possible complications of, reaction to anesthesia, and infection.  DESCRIPTION OF PROCEDURE:  The patient was taken to the operating suite, where a general anesthetic was applied without difficulty.  She was positioned supine and prepped and draped in normal sterile fashion.  After administration of preoperative IV antibiotics, an arthroscopy of the right knee was performed through a total of two inferior portals.  Suprapatellar pouch was benign, while the patellofemoral joint did exhibit some grade 3 change in some small areas along the superior and medial aspect.  A thorough chondroplasty was performed.  The patella actually tracked very well and the lateral structures were loose, so no lateral release was performed.  The medial compartment exhibited no evidence of meniscal or articular cartilage injury.  In the notch, the ACL and PCL were intact.  She had about a 1 cm loose body, true cartilaginous, and was floating in the knee.  This was removed.  In  the lateral compartment, there was no evidence of meniscal or articular cartilage injury.  The knee was thoroughly irrigated, followed by placement of Marcaine with epinephrine and morphine.  Adaptic was placed over the portal sites, followed by dry gauze and a loose Ace wrap.  Estimated blood loss and intraoperative fluids can be obtained from the anesthesia records.  DISPOSITION:  The patient was taken to the recovery room in stable condition. Plans were for her to go home the same day and to follow up in the office in less than a week.  I will contact her by phone tonight. DD:  07/26/00 TD:  07/26/00 Job: [id number]

## 2010-10-05 ENCOUNTER — Encounter: Payer: Self-pay | Admitting: Internal Medicine

## 2010-11-02 ENCOUNTER — Other Ambulatory Visit: Payer: Self-pay | Admitting: *Deleted

## 2010-11-02 MED ORDER — ALPRAZOLAM 0.5 MG PO TABS: 0.5000 mg | ORAL_TABLET | Freq: Every evening | ORAL | Status: DC | PRN

## 2010-11-02 NOTE — Telephone Encounter (Signed)
Faxed script back to [facility]/[address] @ [phone].Marland KitchenMarland Kitchen7/23/12@1 :33pm/LMB

## 2011-01-08 ENCOUNTER — Telehealth: Payer: Self-pay | Admitting: Gastroenterology

## 2011-01-08 ENCOUNTER — Telehealth: Payer: Self-pay

## 2011-01-08 NOTE — Telephone Encounter (Signed)
Dr Christella Hartigan is it ok to send this letter?

## 2011-01-08 NOTE — Telephone Encounter (Signed)
Pt called requesting a letter stating she has been diagnosed with Depression and is being treated for it. Please advise.

## 2011-01-08 NOTE — Telephone Encounter (Signed)
Pt notified and letter mailed

## 2011-01-08 NOTE — Telephone Encounter (Signed)
Please send a copy of my last office note (was in 2011), there is a good summary of her condition "lymphocytic colitis" at the top of the note which they can send to whomever they need.

## 2011-01-08 NOTE — Telephone Encounter (Signed)
Ok to generate letter stating same - pt is overdue for follow up: recommended q35mo for depression check - last seen 06/2010 - thanks

## 2011-01-08 NOTE — Telephone Encounter (Signed)
Letter generated and placed in cabinet, pt informed. Pt transferred to schedule follow up

## 2011-07-14 ENCOUNTER — Ambulatory Visit (INDEPENDENT_AMBULATORY_CARE_PROVIDER_SITE_OTHER): Payer: Self-pay | Admitting: Internal Medicine

## 2011-07-14 ENCOUNTER — Encounter: Payer: Self-pay | Admitting: Internal Medicine

## 2011-07-14 VITALS — BP 100/68 | HR 77 | Temp 98.8°F | Resp 16 | Ht 62.0 in | Wt 131.0 lb

## 2011-07-14 DIAGNOSIS — K5289 Other specified noninfective gastroenteritis and colitis: Secondary | ICD-10-CM

## 2011-07-14 DIAGNOSIS — F3289 Other specified depressive episodes: Secondary | ICD-10-CM

## 2011-07-14 DIAGNOSIS — F329 Major depressive disorder, single episode, unspecified: Secondary | ICD-10-CM

## 2011-07-14 DIAGNOSIS — M129 Arthropathy, unspecified: Secondary | ICD-10-CM

## 2011-07-14 DIAGNOSIS — K52839 Microscopic colitis, unspecified: Secondary | ICD-10-CM | POA: Insufficient documentation

## 2011-07-14 MED ORDER — TRAZODONE HCL 50 MG PO TABS: 50.0000 mg | ORAL_TABLET | Freq: Every day | ORAL | Status: DC

## 2011-07-14 MED ORDER — NAPROXEN SODIUM 220 MG PO TABS: 220.0000 mg | ORAL_TABLET | Freq: Two times a day (BID) | ORAL | Status: DC

## 2011-07-14 MED ORDER — PAROXETINE HCL 40 MG PO TABS: 40.0000 mg | ORAL_TABLET | ORAL | Status: DC

## 2011-07-14 MED ORDER — IMIPRAMINE HCL 50 MG PO TABS: 50.0000 mg | ORAL_TABLET | Freq: Every day | ORAL | Status: DC

## 2011-07-14 NOTE — Patient Instructions (Signed)
It was good to see you today. Use Aleve twice a day in addition to Tylenol for arthritis and pain symptoms Other medications reviewed, refills today. No prescription changes Please schedule followup in 1 year, call sooner if problems.

## 2011-07-14 NOTE — Assessment & Plan Note (Signed)
Chronic disease, exacerbated by social and family stressors including no medical insurance Reviewed symptoms, no SI/HI Baseline controlled on current medications, refills provided + support offered

## 2011-07-14 NOTE — Assessment & Plan Note (Signed)
Diffuse symptoms in bilateral hands, ankles and knees Prior and remote orthopedic evaluation confirmed osteoarthritis No joint swelling or deformities Recommended continued daily anti-inflammatory with Tylenol as needed

## 2011-07-14 NOTE — Progress Notes (Signed)
  Subjective:    Patient ID: Monique Swanson, female    DOB: 1955-06-05, 56 y.o.   MRN: [id number]  HPI Here for follow up - reviewed chronic medical issues Not seen >46modue to no medical insurance   Chronic diarrhea - microscopic colitis dx 02/2009 improved but not resolved on tx, now worse off meds and worse with emotional stress taking lomotil  which helps - but takes up to 10/d occ blood (BRBPR) mixed with stool, nonpainful   Depression, chronic exac by sudden death of spouse 506/03/2010- also other family illness and stressors ongoing no adv SE with paxil - feels well on this - not in counseling at this time - unable to afford   Arthritis, diffuse - reports daily tylenol instead of aleve or advil  - insufficient pain relief Affects hands, ankles, knees- Not associated with swelling   Past Medical History  Diagnosis Date  . Osteoarthritis   . CARCINOMA, SQUAMOUS CELL 2009    s/p rescrtion from R nose bridge  . DEPRESSION   . Headache   . Irritable bowel syndrome   . OSTEOPENIA   . Microscopic colitis dx 02/2009    chronic diarrhea     Review of Systems  Constitutional: Positive for fatigue. Negative for fever and unexpected weight change.  Respiratory: Negative for shortness of breath and wheezing.   Gastrointestinal: Negative for abdominal pain and anal bleeding.  Psychiatric/Behavioral: Positive for dysphoric mood and decreased concentration. Negative for hallucinations, behavioral problems, confusion and self-injury. The patient is nervous/anxious.        Objective:   Physical Exam BP 100/68  Pulse 77  Temp(Src) 98.8 F (37.1 C) (Oral)  Resp 16  Ht 5' 2"  (1.575 m)  Wt 131 lb (59.421 kg)  BMI 23.96 kg/m2  SpO2 94% Wt Readings from Last 3 Encounters:  07/14/11 131 lb (59.421 kg)  06/12/10 122 lb 9.6 oz (55.611 kg)  03/02/10 126 lb 8 oz (57.38 kg)   Constitutional: She appears well-developed and well-nourished. No distress.  Cardiovascular: Normal rate,  regular rhythm and normal heart sounds.  No murmur heard. No BLE edema. Pulmonary/Chest: Effort normal and breath sounds normal. No respiratory distress. She has no wheezes.  Abdominal: Soft. Bowel sounds are normal. no distension. There is no tenderness. no masses MSkel: no gross deformities or active synovitis/effusions  Skin: Skin is warm and dry. No rash noted. No erythema.  Psychiatric: She has a depressed mood and affect. Her behavior is normal. Judgment and thought content normal.   Lab Results  Component Value Date   WBC 7.1 03/02/2010   HGB 15.6* 03/02/2010   HCT 44.3 03/02/2010   PLT 324.0 03/02/2010   GLUCOSE 102* 03/02/2010   CHOL 193 07/22/2008   TRIG 60.0 07/22/2008   HDL 59.20 07/22/2008   LDLCALC 122* 07/22/2008   ALT 11 09/26/2009   AST 21 09/26/2009   NA 139 03/02/2010   K 4.1 03/02/2010   CL 102 03/02/2010   CREATININE 0.9 03/02/2010   BUN 10 03/02/2010   CO2 28 03/02/2010   TSH 1.95 02/21/2009      Assessment & Plan:  See problem list. Medications and labs reviewed today.

## 2011-07-14 NOTE — Assessment & Plan Note (Signed)
Chronic diarrhea - dx 02/2009 colo Unable to afford meds - uses immodium daily -  Reports working on disability application for same

## 2012-02-10 ENCOUNTER — Encounter: Payer: Self-pay | Admitting: Internal Medicine

## 2012-02-10 ENCOUNTER — Ambulatory Visit (INDEPENDENT_AMBULATORY_CARE_PROVIDER_SITE_OTHER): Payer: Medicare Other | Admitting: Internal Medicine

## 2012-02-10 VITALS — BP 110/72 | HR 84 | Temp 97.3°F | Ht 62.0 in | Wt 134.0 lb

## 2012-02-10 DIAGNOSIS — M79609 Pain in unspecified limb: Secondary | ICD-10-CM | POA: Diagnosis not present

## 2012-02-10 DIAGNOSIS — F17200 Nicotine dependence, unspecified, uncomplicated: Secondary | ICD-10-CM

## 2012-02-10 DIAGNOSIS — Z720 Tobacco use: Secondary | ICD-10-CM

## 2012-02-10 DIAGNOSIS — M21619 Bunion of unspecified foot: Secondary | ICD-10-CM

## 2012-02-10 DIAGNOSIS — R079 Chest pain, unspecified: Secondary | ICD-10-CM

## 2012-02-10 DIAGNOSIS — M79644 Pain in right finger(s): Secondary | ICD-10-CM

## 2012-02-10 DIAGNOSIS — J410 Simple chronic bronchitis: Secondary | ICD-10-CM | POA: Insufficient documentation

## 2012-02-10 DIAGNOSIS — F329 Major depressive disorder, single episode, unspecified: Secondary | ICD-10-CM

## 2012-02-10 DIAGNOSIS — M21611 Bunion of right foot: Secondary | ICD-10-CM

## 2012-02-10 MED ORDER — ASPIRIN EC 81 MG PO TBEC: 81.0000 mg | DELAYED_RELEASE_TABLET | Freq: Every day | ORAL | Status: DC

## 2012-02-10 NOTE — Assessment & Plan Note (Signed)
Chronic disease, exacerbated by social and family stressors  Reviewed symptoms, no SI/HI Baseline controlled on current medications, refills provided + support offered

## 2012-02-10 NOTE — Assessment & Plan Note (Signed)
5 minutes today spent counseling patient on unhealthy effects of continued tobacco abuse and encouragement of cessation including medical options available to help the patient quit smoking. 

## 2012-02-10 NOTE — Patient Instructions (Addendum)
It was good to see you today. We have reviewed your prior records including labs and tests today we'll make referral to foot specialist and hand specialist. Our office will contact you regarding appointment(s) once made. EKG today does not show evidence for heart problems. Continue aspirin as ongoing and work on tobacco cessation as discussed Followup in 6-8 weeks on other medical issues, call sooner as neededYou Can Quit Smoking If you are ready to quit smoking or are thinking about it, congratulations! You have chosen to help yourself be healthier and live longer! There are lots of different ways to quit smoking. Nicotine gum, nicotine patches, a nicotine inhaler, or nicotine nasal spray can help with physical craving. Hypnosis, support groups, and medicines help break the habit of smoking. TIPS TO GET OFF AND STAY OFF CIGARETTES  Learn to predict your moods. Do not let a bad situation be your excuse to have a cigarette. Some situations in your life might tempt you to have a cigarette.   Ask friends and co-workers not to smoke around you.   Make your home smoke-free.   Never have "just one" cigarette. It leads to wanting another and another. Remind yourself of your decision to quit.   On a card, make a list of your reasons for not smoking. Read it at least the same number of times a day as you have a cigarette. Tell yourself everyday, "I do not want to smoke. I choose not to smoke."   Ask someone at home or work to help you with your plan to quit smoking.   Have something planned after you eat or have a cup of coffee. Take a walk or get other exercise to perk you up. This will help to keep you from overeating.   Try a relaxation exercise to calm you down and decrease your stress. Remember, you may be tense and nervous the first two weeks after you quit. This will pass.   Find new activities to keep your hands busy. Play with a pen, coin, or rubber band. Doodle or draw things on paper.    Brush your teeth right after eating. This will help cut down the craving for the taste of tobacco after meals. You can try mouthwash too.   Try gum, breath mints, or diet candy to keep something in your mouth.  IF YOU SMOKE AND WANT TO QUIT:  Do not stock up on cigarettes. Never buy a carton. Wait until one pack is finished before you buy another.   Never carry cigarettes with you at work or at home.   Keep cigarettes as far away from you as possible. Leave them with someone else.   Never carry matches or a lighter with you.   Ask yourself, "Do I need this cigarette or is this just a reflex?"   Bet with someone that you can quit. Put cigarette money in a piggy bank every morning. If you smoke, you give up the money. If you do not smoke, by the end of the week, you keep the money.   Keep trying. It takes 21 days to change a habit!   Talk to your doctor about using medicines to help you quit. These include nicotine replacement gum, lozenges, or skin patches.  Document Released: 01/23/2009 Document Revised: 06/21/2011 Document Reviewed: 01/23/2009 [facility] Patient Information 2013 [place], [place].

## 2012-02-10 NOTE — Progress Notes (Signed)
Subjective:    Patient ID: Monique Swanson, female    DOB: [birth year], 56 y.o.   MRN: [id number]  Foot Pain This is a chronic problem. The current episode started more than 1 month ago. The problem occurs daily. The problem has been gradually worsening. Associated symptoms include arthralgias, chest pain (x 2 days, nonexertional, no radiation), coughing and fatigue. Pertinent negatives include no abdominal pain, anorexia, change in bowel habit, congestion, diaphoresis, fever, headaches, joint swelling, myalgias, nausea, numbness, sore throat, swollen glands, urinary symptoms, visual change, vomiting or weakness. The symptoms are aggravated by walking and exertion. She has tried rest for the symptoms. The treatment provided no relief.   Also complains of injury to right index finger Onset 2 months ago Precipitated by accidental crush injury of fingertip Subsequent swelling and pain in distal fingertip Improved but not resolved also reviewed chronic medical issues:  Chronic diarrhea - microscopic colitis dx 02/2009 improved but not resolved on tx, exacerbation off meds and worse with emotional stress taking over-the-counter Imodium, up to 10/d -to control at 3-4 bowel movements per day   Depression, chronic exac by sudden death of spouse 09-04-2009 - also other family illness and stressors ongoing no adv SE with paxil, trazodone and imipramine - feels generally well on this - not in counseling at this time - unable to afford   Past Medical History  Diagnosis Date  . Osteoarthritis   . CARCINOMA, SQUAMOUS CELL 09/05/07    s/p rescrtion from R nose bridge  . DEPRESSION   . Headache   . Irritable bowel syndrome   . OSTEOPENIA   . Microscopic colitis dx 02/2009    chronic diarrhea    Review of Systems  Constitutional: Positive for fatigue. Negative for fever, diaphoresis and unexpected weight change.  HENT: Negative for congestion and sore throat.   Respiratory: Positive for cough. Negative  for shortness of breath and wheezing.   Cardiovascular: Positive for chest pain (x 2 days, nonexertional, no radiation).  Gastrointestinal: Negative for nausea, vomiting, abdominal pain, anal bleeding, anorexia and change in bowel habit.  Musculoskeletal: Positive for arthralgias. Negative for myalgias and joint swelling.  Neurological: Negative for weakness, numbness and headaches.  Psychiatric/Behavioral: Negative for hallucinations, behavioral problems, confusion and self-injury. The patient is nervous/anxious.        Objective:   Physical Exam  BP 110/72  Pulse 84  Temp 97.3 F (36.3 C) (Oral)  Ht 5\' 2"  (1.575 m)  Wt 134 lb (60.782 kg)  BMI 24.51 kg/m2  SpO2 98% Wt Readings from Last 3 Encounters:  02/10/12 134 lb (60.782 kg)  07/14/11 131 lb (59.421 kg)  06/12/10 122 lb 9.6 oz (55.611 kg)   Constitutional: She appears well-developed and well-nourished. No distress.  Cardiovascular: Normal rate, regular rhythm and normal heart sounds.  No murmur heard. No BLE edema. Pulmonary/Chest: Effort normal and breath sounds normal. No respiratory distress. She has no wheezes.  Abdominal: Soft. Bowel sounds are normal. no distension. There is no tenderness. no masses MSkel: Bunion deformity of right foot with callus formation -right index finger with  chronic swelling of the IP. Limited extensor function at PIP but normal other extensor function and full flexure -no active synovitis/effusions  Skin: Skin is warm and dry. No rash noted. No erythema.  Psychiatric: She has a dysphoric mood and affect. Her behavior is normal. Judgment and thought content normal.   Lab Results  Component Value Date   WBC 7.1 03/02/2010   HGB 15.6* 03/02/2010  HCT 44.3 03/02/2010   PLT 324.0 03/02/2010   GLUCOSE 102* 03/02/2010   CHOL 193 07/22/2008   TRIG 60.0 07/22/2008   HDL 59.20 07/22/2008   LDLCALC 122* 07/22/2008   ALT 11 09/26/2009   AST 21 09/26/2009   NA 139 03/02/2010   K 4.1 03/02/2010   CL  102 03/02/2010   CREATININE 0.9 03/02/2010   BUN 10 03/02/2010   CO2 28 03/02/2010   TSH 1.95 02/21/2009   ECG: Sinus at 79 beats per minute. Nonspecific T wave abnormality -no ischemic change or arrhythmia    Assessment & Plan:    chest pain - suspect noncardiac. Onset x48 hours, associated with mild cough. ECG today without ischemic change or arrhythmia -advised on smoking cessation, over-the-counter cough medication and to call if symptoms worse or unimproved  Right bunion with callus and pain. Refer to podiatry for further evaluation and treatment as needed  Injury to right index finger DIP following crush injury 2 months ago. Limited extensor function at DIP -refer to hand specialist for further evaluation and treatment at patient request given pain and limitation of use on dominant hand

## 2012-02-23 DIAGNOSIS — I798 Other disorders of arteries, arterioles and capillaries in diseases classified elsewhere: Secondary | ICD-10-CM | POA: Diagnosis not present

## 2012-02-23 DIAGNOSIS — M19079 Primary osteoarthritis, unspecified ankle and foot: Secondary | ICD-10-CM | POA: Diagnosis not present

## 2012-02-23 DIAGNOSIS — M2010 Hallux valgus (acquired), unspecified foot: Secondary | ICD-10-CM | POA: Diagnosis not present

## 2012-02-23 DIAGNOSIS — M79609 Pain in unspecified limb: Secondary | ICD-10-CM | POA: Diagnosis not present

## 2012-02-29 ENCOUNTER — Other Ambulatory Visit (HOSPITAL_COMMUNITY): Payer: Self-pay

## 2012-02-29 DIAGNOSIS — R209 Unspecified disturbances of skin sensation: Secondary | ICD-10-CM

## 2012-02-29 DIAGNOSIS — R0989 Other specified symptoms and signs involving the circulatory and respiratory systems: Secondary | ICD-10-CM

## 2012-03-01 ENCOUNTER — Ambulatory Visit (HOSPITAL_COMMUNITY)
Admission: RE | Admit: 2012-03-01 | Discharge: 2012-03-01 | Disposition: A | Discharge status: Home / Self Care | Payer: Medicare Other | Source: Ambulatory Visit

## 2012-03-01 DIAGNOSIS — R0989 Other specified symptoms and signs involving the circulatory and respiratory systems: Secondary | ICD-10-CM

## 2012-03-01 DIAGNOSIS — R209 Unspecified disturbances of skin sensation: Secondary | ICD-10-CM | POA: Diagnosis not present

## 2012-03-01 DIAGNOSIS — R6889 Other general symptoms and signs: Secondary | ICD-10-CM | POA: Insufficient documentation

## 2012-03-01 DIAGNOSIS — I70219 Atherosclerosis of native arteries of extremities with intermittent claudication, unspecified extremity: Secondary | ICD-10-CM | POA: Diagnosis not present

## 2012-03-02 NOTE — Progress Notes (Signed)
BLE Arterial Duplex completed.  Monique Swanson

## 2012-03-10 ENCOUNTER — Encounter (HOSPITAL_COMMUNITY): Payer: Self-pay | Admitting: *Deleted

## 2012-03-10 ENCOUNTER — Emergency Department (HOSPITAL_COMMUNITY)
Admission: EM | Admit: 2012-03-10 | Discharge: 2012-03-10 | Disposition: A | Discharge status: Home / Self Care | Payer: Medicare Other | Attending: Emergency Medicine | Admitting: Emergency Medicine

## 2012-03-10 DIAGNOSIS — Z79899 Other long term (current) drug therapy: Secondary | ICD-10-CM | POA: Diagnosis not present

## 2012-03-10 DIAGNOSIS — F17200 Nicotine dependence, unspecified, uncomplicated: Secondary | ICD-10-CM | POA: Insufficient documentation

## 2012-03-10 DIAGNOSIS — F3289 Other specified depressive episodes: Secondary | ICD-10-CM | POA: Insufficient documentation

## 2012-03-10 DIAGNOSIS — M899 Disorder of bone, unspecified: Secondary | ICD-10-CM | POA: Diagnosis not present

## 2012-03-10 DIAGNOSIS — F329 Major depressive disorder, single episode, unspecified: Secondary | ICD-10-CM | POA: Insufficient documentation

## 2012-03-10 DIAGNOSIS — Z7982 Long term (current) use of aspirin: Secondary | ICD-10-CM | POA: Diagnosis not present

## 2012-03-10 DIAGNOSIS — R197 Diarrhea, unspecified: Secondary | ICD-10-CM | POA: Diagnosis not present

## 2012-03-10 DIAGNOSIS — Z8589 Personal history of malignant neoplasm of other organs and systems: Secondary | ICD-10-CM | POA: Diagnosis not present

## 2012-03-10 DIAGNOSIS — K589 Irritable bowel syndrome without diarrhea: Secondary | ICD-10-CM | POA: Insufficient documentation

## 2012-03-10 DIAGNOSIS — M1990 Unspecified osteoarthritis, unspecified site: Secondary | ICD-10-CM | POA: Insufficient documentation

## 2012-03-10 LAB — URINALYSIS, MICROSCOPIC ONLY
Bilirubin Urine: NEGATIVE
Glucose, UA: NEGATIVE mg/dL
Hgb urine dipstick: NEGATIVE
Ketones, ur: NEGATIVE mg/dL
Leukocytes, UA: NEGATIVE
Nitrite: NEGATIVE
Protein, ur: NEGATIVE mg/dL
Specific Gravity, Urine: 1.01 (ref 1.005–1.030)
Urine-Other: NONE SEEN
Urobilinogen, UA: 0.2 mg/dL (ref 0.0–1.0)
pH: 6 (ref 5.0–8.0)

## 2012-03-10 LAB — CBC WITH DIFFERENTIAL/PLATELET
Basophils Absolute: 0 10*3/uL (ref 0.0–0.1)
Basophils Relative: 0 % (ref 0–1)
Eosinophils Absolute: 0.1 10*3/uL (ref 0.0–0.7)
Eosinophils Relative: 2 % (ref 0–5)
HCT: 40.9 % (ref 36.0–46.0)
Hemoglobin: 13.8 g/dL (ref 12.0–15.0)
Lymphocytes Relative: 36 % (ref 12–46)
Lymphs Abs: 2.4 10*3/uL (ref 0.7–4.0)
MCH: 30.9 pg (ref 26.0–34.0)
MCHC: 33.7 g/dL (ref 30.0–36.0)
MCV: 91.5 fL (ref 78.0–100.0)
Monocytes Absolute: 0.5 10*3/uL (ref 0.1–1.0)
Monocytes Relative: 8 % (ref 3–12)
Neutro Abs: 3.6 10*3/uL (ref 1.7–7.7)
Neutrophils Relative %: 54 % (ref 43–77)
Platelets: 373 10*3/uL (ref 150–400)
RBC: 4.47 MIL/uL (ref 3.87–5.11)
RDW: 13.8 % (ref 11.5–15.5)
WBC: 6.7 10*3/uL (ref 4.0–10.5)

## 2012-03-10 LAB — COMPREHENSIVE METABOLIC PANEL
ALT: 10 U/L (ref 0–35)
AST: 17 U/L (ref 0–37)
Albumin: 3.3 g/dL — ABNORMAL LOW (ref 3.5–5.2)
Alkaline Phosphatase: 105 U/L (ref 39–117)
BUN: 9 mg/dL (ref 6–23)
CO2: 21 mEq/L (ref 19–32)
Calcium: 9 mg/dL (ref 8.4–10.5)
Chloride: 101 mEq/L (ref 96–112)
Creatinine, Ser: 0.83 mg/dL (ref 0.50–1.10)
GFR calc Af Amer: 90 mL/min — ABNORMAL LOW (ref >90)
GFR calc non Af Amer: 77 mL/min — ABNORMAL LOW (ref >90)
Glucose, Bld: 85 mg/dL (ref 70–99)
Potassium: 3.4 mEq/L — ABNORMAL LOW (ref 3.5–5.1)
Sodium: 135 mEq/L (ref 135–145)
Total Bilirubin: 0.2 mg/dL — ABNORMAL LOW (ref 0.3–1.2)
Total Protein: 6.9 g/dL (ref 6.0–8.3)

## 2012-03-10 MED ORDER — PSYLLIUM 28 % PO PACK: 1.0000 | PACK | Freq: Two times a day (BID) | ORAL | Status: DC

## 2012-03-10 MED ORDER — SODIUM CHLORIDE 0.9 % IV SOLN
1000.0000 mL | Freq: Once | INTRAVENOUS | Status: AC
  Administered 2012-03-10: 1000 mL via INTRAVENOUS

## 2012-03-10 MED ORDER — ONDANSETRON HCL 4 MG/2ML IJ SOLN
4.0000 mg | Freq: Once | INTRAMUSCULAR | Status: AC
  Administered 2012-03-10: 4 mg via INTRAVENOUS
  Filled 2012-03-10: qty 2

## 2012-03-10 MED ORDER — SODIUM CHLORIDE 0.9 % IV SOLN
1000.0000 mL | INTRAVENOUS | Status: DC
  Administered 2012-03-10: 1000 mL via INTRAVENOUS

## 2012-03-10 MED ORDER — DIPHENOXYLATE-ATROPINE 2.5-0.025 MG PO TABS: 1.0000 | ORAL_TABLET | Freq: Four times a day (QID) | ORAL | Status: DC | PRN

## 2012-03-10 MED ORDER — POTASSIUM CHLORIDE CRYS ER 20 MEQ PO TBCR
40.0000 meq | EXTENDED_RELEASE_TABLET | Freq: Once | ORAL | Status: AC
  Administered 2012-03-10: 40 meq via ORAL
  Filled 2012-03-10: qty 1

## 2012-03-10 NOTE — ED Notes (Signed)
Pt has history of colitis, diarrhea for 9 days 8-10 stools per day

## 2012-03-10 NOTE — ED Notes (Signed)
Patient went to the toilet and stated that she had diarrhea and only got a drop of urine in the cup so she will go back and try again later

## 2012-03-10 NOTE — ED Notes (Signed)
Sample patient for a urine sample and she stated that she can not use the restroom at this time.

## 2012-03-10 NOTE — ED Provider Notes (Signed)
History     CSN: [id number] Arrival date & time 03/10/12  1154 First MD Initiated Contact with Patient 03/10/12 1210   Chief Complaint  Patient presents with  . Diarrhea    Patient is a 56 y.o. female presenting with diarrhea. The history is provided by the patient.  Diarrhea The primary symptoms include diarrhea. The illness began more than 7 days ago. The onset was gradual. The problem has not changed since onset. The diarrhea is watery and mucous. The diarrhea occurs 5 to 10 times per day.  The illness does not include chills or back pain. Significant associated medical issues include inflammatory bowel disease.  No foreign travel.  No abx.  Pt has not seen anyone for this episode.  She does have a history of microscopic colitis and IBS.   Past Medical History  Diagnosis Date  . Osteoarthritis   . CARCINOMA, SQUAMOUS CELL 2009    s/p rescrtion from R nose bridge  . DEPRESSION   . Headache   . Irritable bowel syndrome   . OSTEOPENIA   . Microscopic colitis dx 02/2009    chronic diarrhea    Past Surgical History  Procedure Date  . Appendectomy 1969  . Abdominal hysterectomy 1985  . Breast surgery 1987    breast biopsy  . Surgery on both feet 1990    for joint in toes    Family History  Problem Relation Age of Onset  . Dementia Mother   . Coronary artery disease Mother   . Asthma Father   . Breast cancer Sister     History  Substance Use Topics  . Smoking status: Current Every Day Smoker -- 35 years    Last Attempt to Quit: 04/13/2007  . Smokeless tobacco: Not on file     Comment: Widowed 08/24/09, 2 grown children. son in [place] and dtr in [place]  . Alcohol Use: Yes     Comment: occassional    OB History    Grav Para Term Preterm Abortions TAB SAB Ect Mult Living                  Review of Systems  Constitutional: Negative for chills.  Gastrointestinal: Positive for diarrhea.  Musculoskeletal: Negative for back pain.  All other systems reviewed  and are negative.    Allergies  Tetracycline  Home Medications   Current Outpatient Rx  Name  Route  Sig  Dispense  Refill  . ACETAMINOPHEN 500 MG PO TABS   Oral   Take 1,000 mg by mouth daily. Patient states she has been taking  6 to 8  A day         . ASPIRIN EC 81 MG PO TBEC   Oral   Take 1 tablet (81 mg total) by mouth daily.   150 tablet   2   . CLONAZEPAM 0.5 MG PO TABS      Take 1/4 tablet in morning and 1 tablet in the evening   30 tablet      . IMIPRAMINE HCL 50 MG PO TABS   Oral   Take 1 tablet (50 mg total) by mouth at bedtime.   30 tablet   11   . LOPERAMIDE HCL 2 MG PO TABS   Oral   Take 2 mg by mouth 2 (two) times daily. Take 5 tablets every morning and 5 tablets in evening         . PAROXETINE HCL 40 MG PO TABS   Oral  Take 1 tablet (40 mg total) by mouth every morning.   30 tablet   11   . TRAZODONE HCL 50 MG PO TABS   Oral   Take 1 tablet (50 mg total) by mouth at bedtime.   30 tablet   11     BP 113/63  Pulse 60  Temp 98.4 F (36.9 C) (Oral)  Resp 18  SpO2 100%  Physical Exam  Nursing note and vitals reviewed. Constitutional: She appears well-developed and well-nourished. No distress.  HENT:  Head: Normocephalic and atraumatic.  Right Ear: External ear normal.  Left Ear: External ear normal.  Eyes: Conjunctivae normal are normal. Right eye exhibits no discharge. Left eye exhibits no discharge. No scleral icterus.  Neck: Neck supple. No tracheal deviation present.  Cardiovascular: Normal rate, regular rhythm and intact distal pulses.   Pulmonary/Chest: Effort normal and breath sounds normal. No stridor. No respiratory distress. She has no wheezes. She has no rales.  Abdominal: Soft. Bowel sounds are normal. She exhibits no distension. There is no tenderness. There is no rebound and no guarding.  Musculoskeletal: She exhibits no edema and no tenderness.  Neurological: She is alert. She has normal strength. No sensory deficit.  Cranial nerve deficit:  no gross defecits noted. She exhibits normal muscle tone. She displays no seizure activity. Coordination normal.  Skin: Skin is warm and dry. No rash noted.  Psychiatric: She has a normal mood and affect.    ED Course  Procedures (including critical care time)  Labs Reviewed  COMPREHENSIVE METABOLIC PANEL - Abnormal; Notable for the following:    Potassium 3.4 (*)     Albumin 3.3 (*)     Total Bilirubin 0.2 (*)     GFR calc non Af Amer 77 (*)     GFR calc Af Amer 90 (*)     All other components within normal limits  CBC WITH DIFFERENTIAL  URINALYSIS, MICROSCOPIC ONLY   No results found.   1. Diarrhea       MDM  Pt has hx of IBS and diarrhea.  Benign abdominal exam.   At this time there does not appear to be any evidence of an acute emergency medical condition and the patient appears stable for discharge with appropriate outpatient follow up.  Will try lomotil and psyllium fiber treatment.  Recc follow up with gi doctor.       Kathalene Frames, MD 03/10/12 [phone]

## 2012-03-13 ENCOUNTER — Telehealth: Payer: Self-pay | Admitting: Gastroenterology

## 2012-03-13 NOTE — Telephone Encounter (Signed)
Patient called back says she's had diarrhea  12 days and would something called in while she waits for an appointment.

## 2012-03-13 NOTE — Telephone Encounter (Signed)
Pt has been scheduled with Amy Esterwood for 03/14/12

## 2012-03-14 ENCOUNTER — Encounter: Payer: Self-pay | Admitting: Physician Assistant

## 2012-03-14 ENCOUNTER — Ambulatory Visit (INDEPENDENT_AMBULATORY_CARE_PROVIDER_SITE_OTHER): Payer: Medicare Other | Admitting: Physician Assistant

## 2012-03-14 ENCOUNTER — Other Ambulatory Visit: Payer: Medicare Other

## 2012-03-14 VITALS — BP 80/52 | HR 84 | Ht 62.0 in | Wt 134.4 lb

## 2012-03-14 DIAGNOSIS — K52832 Lymphocytic colitis: Secondary | ICD-10-CM

## 2012-03-14 DIAGNOSIS — R197 Diarrhea, unspecified: Secondary | ICD-10-CM | POA: Diagnosis not present

## 2012-03-14 DIAGNOSIS — K5289 Other specified noninfective gastroenteritis and colitis: Secondary | ICD-10-CM | POA: Diagnosis not present

## 2012-03-14 MED ORDER — DICYCLOMINE HCL 10 MG PO CAPS: 10.0000 mg | ORAL_CAPSULE | Freq: Three times a day (TID) | ORAL | Status: DC

## 2012-03-14 MED ORDER — BUDESONIDE 9 MG PO TB24: 1.0000 | ORAL_TABLET | Freq: Every day | ORAL | Status: DC

## 2012-03-14 MED ORDER — BUDESONIDE 3 MG PO CP24: 3.0000 mg | ORAL_CAPSULE | ORAL | Status: DC

## 2012-03-14 MED ORDER — METRONIDAZOLE 250 MG PO TABS: 250.0000 mg | ORAL_TABLET | Freq: Three times a day (TID) | ORAL | Status: DC

## 2012-03-14 MED ORDER — DIPHENOXYLATE-ATROPINE 2.5-0.025 MG PO TABS: 1.0000 | ORAL_TABLET | Freq: Four times a day (QID) | ORAL | Status: DC | PRN

## 2012-03-14 MED ORDER — ALIGN PO CAPS: 1.0000 | ORAL_CAPSULE | Freq: Every day | ORAL | Status: DC

## 2012-03-14 NOTE — Progress Notes (Signed)
I agree with the plan outlined in the above note

## 2012-03-14 NOTE — Patient Instructions (Addendum)
You have been given a separate informational sheet regarding your tobacco use, the importance of quitting and local resources to help you quit. Please go to the basement level to have your labs drawn.  We sent prescriptions to [facility] [address] 1. Bentyl ( Dicyclomine ) 2. Flagyl  ( Metronidazole ) We gave you samples of Uceris and the Align probiotic. Take the Align once daily. Take the Ucderis one tab daily.  Made you a follow up appointment with Dr Christella Hartigan, 12-24-@ 8:45 Am.

## 2012-03-14 NOTE — Progress Notes (Signed)
Subjective:    Patient ID: Monique Swanson, female    DOB: [birth year], 56 y.o.   MRN: [id number]  HPI Mayson is a pleasant 56 year old white female known to Dr. Christella Hartigan who was diagnosed with lymphocytic colitis in 2010. She was treated with Entocort successfully, but has not been seen back in the office over the past 3 years. She says that she lost her insurance, and her husband died in the interim  as well and she did not have any ability to pay for medications. She has Medicare now but no prescription coverage . She comes in today with severe symptoms over the past 13-14 days, including an ER visit on 03/10/2012 She says that she has been having bilateral lower abdominal crampy pain which is been fairly constant over the past 2 weeks and at least 10-12 watery mucousy bowel movements per day. Upper appetite has been decreased but she has not had any nausea or vomiting. Subjectively she feels as if she's had some fever and has had chills but no rigors. Labs done through the emergency room were unremarkable with the exception of a potassium slightly low at 3.4 area She says she was hydrated with 2 L of fluid and then given a prescription for Lomotil she's been taking 4 per day. On further review she says she has had ongoing symptoms over the past couple of years usually with 3-4 loose bowel movements per day bite taking 8 Imodium daily. Her weight has been stable. Generally her energy level is poor. She has not had any bleeding that she has been aware of Since being seen in the emergency room her symptoms have persisted and she does not feel that the Lomotil has made any difference. She did not have stool cultures done. She has not been on any new medications, or any antibiotics over the past few months    Review of Systems  Constitutional: Positive for chills, activity change, appetite change and fatigue.  HENT: Negative.   Eyes: Negative.   Respiratory: Negative.   Cardiovascular: Negative.    Gastrointestinal: Positive for abdominal pain and diarrhea.  Genitourinary: Negative.   Musculoskeletal: Negative.   Neurological: Positive for weakness.  Hematological: Negative.   Psychiatric/Behavioral: Negative.    Outpatient Prescriptions Prior to Visit  Medication Sig Dispense Refill  . acetaminophen (TYLENOL) 500 MG tablet Take 1,000 mg by mouth daily. Patient states she has been taking  6 to 8  A day      . aspirin EC 81 MG tablet Take 1 tablet (81 mg total) by mouth daily.  150 tablet  2  . clonazePAM (KLONOPIN) 0.5 MG tablet Take 0.25 mg by mouth daily as needed.   30 tablet    . imipramine (TOFRANIL) 50 MG tablet Take 1 tablet (50 mg total) by mouth at bedtime.  30 tablet  11  . loperamide (IMODIUM A-D) 2 MG tablet 2 mg. 8-10 tablets per day      . PARoxetine (PAXIL) 40 MG tablet Take 40 mg by mouth at bedtime.      . traZODone (DESYREL) 50 MG tablet Take 1 tablet (50 mg total) by mouth at bedtime.  30 tablet  11  . diphenoxylate-atropine (LOMOTIL) 2.5-0.025 MG per tablet Take 1 tablet by mouth 4 (four) times daily as needed for diarrhea or loose stools.  30 tablet  0  . bismuth subsalicylate (PEPTO BISMOL) 262 MG/15ML suspension Take 15 mLs by mouth every 6 (six) hours as needed.      Marland Kitchen  psyllium (METAMUCIL SMOOTH TEXTURE) 28 % packet Take 1 packet by mouth 2 (two) times daily.  30 packet  1   Allergies  Allergen Reactions  . Tetracycline Other (See Comments)    Ulcers on tongue   Patient Active Problem List  Diagnosis  . CARCINOMA, SQUAMOUS CELL  . DEFICIENCY OF OTHER VITAMINS  . DEPRESSION  . IRRITABLE BOWEL SYNDROME  . ARTHRITIS  . OSTEOPENIA  . HEADACHE  . DIARRHEA  . Microscopic colitis  . Tobacco abuse   History  Substance Use Topics  . Smoking status: Current Every Day Smoker -- 35 years    Last Attempt to Quit: 04/13/2007  . Smokeless tobacco: Never Used     Comment: Widowed form given 03-14-12  . Alcohol Use: Yes     Comment: socially        Objective:   Physical Exam well-developed somewhat chronically ill-appearing white female in no acute distress, accompanied by her daughter a blood pressure 80/50 pulse 84 height 5 foot 2 weight 134. HEENT; nontraumatic normocephalic EOMI PERRLA sclera anicteric buccal mucosa,neck; Supple no JVD, Cardiovascular ;regular rate and rhythm with S1-S2 no murmur or gallop, Pulmonary; clear bilaterally, Abdomen; soft nondistended bowel sounds are hyperactive she has mild tenderness in the left lower cautery and suprapubic area no guarding or rebound no mass or hepatosplenomegaly. Rectal; not done, Extremities no clubbing cyanosis or edema skin warm and dry, Psych; mood and affect appropriate        Assessment & Plan:  #25 56 year old female with history of previously documented lymphocytic colitis on no treatment over the past 2-1/2 years but initially responsive to Entocort. Patient has had ongoing symptoms over the past 2 years with acute worsening about 2 weeks ago now with 10-12 watery mucoid stools per day and constant lower abdominal cramping. Will need to rule out superimposed infectious etiologies versus severe lymphocytic colitis.   Plan; stool for O&P, CNS and C. difficile PCR Start empiric Flagyl 250 mg by mouth 4 times daily x10 days Start Align one by mouth daily x1 month Start that until 10 mg by mouth 3 times daily as needed for cramping Refill Lomotil, and advised okay to take up to but no more than 8 tablets per day. She will stop the Imodium Would like to treat her with Entocort however after checking with her pharmacy this would be $1300. At this point she is given samples of Uceris 9 mg once daily and has enough to get her through for about a month. In the interim we will look into assistance program's for either Entocort or Uceris and if this fails she may need to be treated with low-dose prednisone. She  is to rest at home and focus on pushing fluids and soft low residue diet. She  is asked to call if her symptoms worsen and at that point will likely require hospitalization , Otherwise will followup in the office within 2 weeks with Dr. Christella Hartigan or myself.

## 2012-03-15 LAB — OVA AND PARASITE SCREEN: OP: NONE SEEN

## 2012-03-15 LAB — CLOSTRIDIUM DIFFICILE BY PCR: Toxigenic C. Difficile by PCR: NOT DETECTED

## 2012-03-16 ENCOUNTER — Telehealth: Payer: Self-pay | Admitting: Physician Assistant

## 2012-03-16 NOTE — Telephone Encounter (Signed)
Patient was seen in the office on 03/14/12 for lymphocytic colitis.  She was hypotensive at that office visit.  She was started on Uceris and her diarrhea is now stopped.  She has been forcing fluids and is able to tolerate liquids without problem.  She is weak and dizzy and "almost passed out last night".  She checked her BP at home and it was 76 systolic.  She was asked to call back for any additional problems.  Amy please advise

## 2012-03-16 NOTE — Telephone Encounter (Signed)
Patient is advised and verbalized understanding of medications instructions and to call for an appt with Dr. Felicity Coyer

## 2012-03-16 NOTE — Telephone Encounter (Signed)
What is her BP this am?   Both Klonopin and imipramine can cause hypotension- her normal is around  100 systolic . I do not think she is dehydrated. She should back off  On lomotil to as needed if diarrhea improved. She can half her dose of klonopin and imipramine for a day or 2-,BUT DO NOT STOP them- probably should see Felicity Coyer today or tomorrow

## 2012-03-18 LAB — STOOL CULTURE

## 2012-03-21 DIAGNOSIS — M79609 Pain in unspecified limb: Secondary | ICD-10-CM | POA: Diagnosis not present

## 2012-03-21 DIAGNOSIS — M779 Enthesopathy, unspecified: Secondary | ICD-10-CM | POA: Diagnosis not present

## 2012-03-21 DIAGNOSIS — M2010 Hallux valgus (acquired), unspecified foot: Secondary | ICD-10-CM | POA: Diagnosis not present

## 2012-03-30 ENCOUNTER — Ambulatory Visit (INDEPENDENT_AMBULATORY_CARE_PROVIDER_SITE_OTHER): Payer: Medicare Other | Admitting: Internal Medicine

## 2012-03-30 ENCOUNTER — Encounter: Payer: Self-pay | Admitting: Internal Medicine

## 2012-03-30 VITALS — BP 104/72 | HR 95 | Temp 97.6°F | Ht 62.0 in | Wt 133.8 lb

## 2012-03-30 DIAGNOSIS — K5289 Other specified noninfective gastroenteritis and colitis: Secondary | ICD-10-CM | POA: Diagnosis not present

## 2012-03-30 DIAGNOSIS — Z720 Tobacco use: Secondary | ICD-10-CM

## 2012-03-30 DIAGNOSIS — F3289 Other specified depressive episodes: Secondary | ICD-10-CM

## 2012-03-30 DIAGNOSIS — L732 Hidradenitis suppurativa: Secondary | ICD-10-CM

## 2012-03-30 DIAGNOSIS — K52839 Microscopic colitis, unspecified: Secondary | ICD-10-CM

## 2012-03-30 DIAGNOSIS — F329 Major depressive disorder, single episode, unspecified: Secondary | ICD-10-CM | POA: Diagnosis not present

## 2012-03-30 DIAGNOSIS — F17200 Nicotine dependence, unspecified, uncomplicated: Secondary | ICD-10-CM | POA: Diagnosis not present

## 2012-03-30 MED ORDER — TRAZODONE HCL 100 MG PO TABS: 100.0000 mg | ORAL_TABLET | Freq: Every day | ORAL | Status: DC

## 2012-03-30 MED ORDER — VENLAFAXINE HCL 37.5 MG PO TABS: 37.5000 mg | ORAL_TABLET | Freq: Two times a day (BID) | ORAL | Status: DC

## 2012-03-30 NOTE — Progress Notes (Signed)
  Subjective:    Patient ID: Monique Swanson, female    DOB: 20-Feb-1956, 56 y.o.   MRN: [id number]  HPI  Here for follow up - reviewed chronic medical issues and interval events:  ER visit 11/29 for diarrhea, GI visit 12/3 for same - meds changes for lymphocytic colitis with improved diarrhea symptoms   Chronic diarrhea - microscopic colitis dx 02/2009 improved but not resolved on tx, now worse off meds and worse with emotional stress taking lomotil  which helps - but takes up to 10/d occ blood (BRBPR) mixed with stool, nonpainful   Depression, chronic - exac by sudden death of spouse 2009-09-10 - also other family illness and stressors ongoing; on paxil since 2009/09/10, prev on prozac, increasing symptoms of sadness and tearfulness - the patient reports compliance with medication(s) as prescribed. Denies adverse side effects. not in counseling at this time - unable to afford same   Arthritis, diffuse - reports daily tylenol instead of aleve or advil  - insufficient pain relief Affects hands, ankles, knees- Not associated with swelling   Past Medical History  Diagnosis Date  . Osteoarthritis   . CARCINOMA, SQUAMOUS CELL 09/11/2007    s/p rescrtion from R nose bridge  . DEPRESSION   . Headache   . Irritable bowel syndrome   . OSTEOPENIA   . Microscopic colitis dx 02/2009    chronic diarrhea    Review of Systems  Constitutional: Positive for fatigue. Negative for fever and unexpected weight change.  Respiratory: Negative for shortness of breath and wheezing.   Gastrointestinal: Negative for abdominal pain and anal bleeding.  Psychiatric/Behavioral: Positive for dysphoric mood and decreased concentration. Negative for hallucinations, behavioral problems, confusion and self-injury. The patient is nervous/anxious.        Objective:   Physical Exam  BP 104/72  Pulse 95  Temp 97.6 F (36.4 C) (Oral)  Ht 5\' 2"  (1.575 m)  Wt 133 lb 12.8 oz (60.691 kg)  BMI 24.47 kg/m2  SpO2 98% Wt Readings  from Last 3 Encounters:  03/30/12 133 lb 12.8 oz (60.691 kg)  03/14/12 134 lb 6.4 oz (60.963 kg)  02/10/12 134 lb (60.782 kg)   Constitutional: She appears well-developed and well-nourished. No distress.  Cardiovascular: Normal rate, regular rhythm and normal heart sounds.  No murmur heard. No BLE edema. Pulmonary/Chest: Effort normal and breath sounds normal. No respiratory distress. She has no wheezes.  Abdominal: Soft. Bowel sounds are normal. no distension. There is no tenderness. no masses MSkel: no gross deformities or active synovitis/effusions  Skin: HS B groin region - no active cellulitis - remaining skin is warm and dry. No rash noted. No erythema.  Psychiatric: She has a depressed mood and affect. Her behavior is normal. Judgment and thought content normal.   Lab Results  Component Value Date   WBC 6.7 03/10/2012   HGB 13.8 03/10/2012   HCT 40.9 03/10/2012   PLT 373 03/10/2012   GLUCOSE 85 03/10/2012   CHOL 193 07/22/2008   TRIG 60.0 07/22/2008   HDL 59.20 07/22/2008   LDLCALC 122* 07/22/2008   ALT 10 03/10/2012   AST 17 03/10/2012   NA 135 03/10/2012   K 3.4* 03/10/2012   CL 101 03/10/2012   CREATININE 0.83 03/10/2012   BUN 9 03/10/2012   CO2 21 03/10/2012   TSH 1.95 02/21/2009      Assessment & Plan:  See problem list. Medications and labs reviewed today.

## 2012-03-30 NOTE — Assessment & Plan Note (Signed)
5 minutes today spent counseling patient on unhealthy effects of continued tobacco abuse and encouragement of cessation including medical options available to help the patient quit smoking. 

## 2012-03-30 NOTE — Assessment & Plan Note (Signed)
Chronic disease, exacerbated by social and family stressors  Reviewed symptoms, increasing tearfulness Change paxil to effexor, increase traz and continue same imipramine dose Verified no si/hi, support offered follow up 6 weeks, sooner if worse

## 2012-03-30 NOTE — Assessment & Plan Note (Signed)
Chronic diarrhea - dx 02/2009 colo Recent exac 02/2012 with ER visit - GI adjusting steroids> symptoms improving The current medical regimen is effective;  continue present plan and medications.

## 2012-03-30 NOTE — Patient Instructions (Signed)
It was good to see you today. Wean off paxil: take 1/2 tab (20mg  total) daily x 1 week then stop When off paxil in 1 week, start effexor 37.5mg  daily x 1 week, then increase to 75mg  daily (2 tabs) until you see me again Increase Trazadone to 100mg  (2-50mg  tabs or new 100mg  tab prescription) at bedtime Continue same dose imipramine at bedtime Your prescription(s) have been submitted to your pharmacy. Please take as directed and contact our office if you believe you are having problem(s) with the medication(s). Information on HS "boil" as discussed - see below Please schedule followup in 6-8 weeks, call sooner if problems. Depression, Adult Depression refers to feeling sad, low, down in the dumps, blue, gloomy, or empty. In general, there are two kinds of depression: 1. Depression that we all experience from time to time because of upsetting life experiences, including the loss of a job or the ending of a relationship (normal sadness or normal grief). This kind of depression is considered normal, is short lived, and resolves within a few days to 2 weeks. (Depression experienced after the loss of a loved one is called bereavement. Bereavement often lasts longer than 2 weeks but normally gets better with time.)  2. Clinical depression, which lasts longer than normal sadness or normal grief or interferes with your ability to function at home, at work, and in school. It also interferes with your personal relationships. It affects almost every aspect of your life. Clinical depression is an illness.  Symptoms of depression also can be caused by conditions other than normal sadness and grief or clinical depression. Examples of these conditions are listed as follows:  Physical illness Some physical illnesses, including underactive thyroid gland (hypothyroidism), severe anemia, specific types of cancer, diabetes, uncontrolled seizures, heart and lung problems, strokes, and chronic pain are commonly associated  with symptoms of depression.   Side effects of some prescription medicine In some people, certain types of prescription medicine can cause symptoms of depression.   Substance abuse Abuse of alcohol and illicit drugs can cause symptoms of depression.  SYMPTOMS Symptoms of normal sadness and normal grief include the following:  Feeling sad or crying for short periods of time.   Not caring about anything (apathy).   Difficulty sleeping or sleeping too much.   No longer able to enjoy the things you used to enjoy.   Desire to be by oneself all the time (social isolation).   Lack of energy or motivation.   Difficulty concentrating or remembering.   Change in appetite or weight.   Restlessness or agitation.  Symptoms of clinical depression include the same symptoms of normal sadness or normal grief and also the following symptoms:  Feeling sad or crying all the time.   Feelings of guilt or worthlessness.   Feelings of hopelessness or helplessness.   Thoughts of suicide or the desire to harm yourself (suicidal ideation).   Loss of touch with reality (psychotic symptoms). Seeing or hearing things that are not real (hallucinations) or having false beliefs about your life or the people around you (delusions and paranoia).  DIAGNOSIS   The diagnosis of clinical depression usually is based on the severity and duration of the symptoms. Your caregiver also will ask you questions about your medical history and substance use to find out if physical illness, use of prescription medicine, or substance abuse is causing your depression. Your caregiver also may order blood tests. TREATMENT   Typically, normal sadness and normal grief do not  require treatment. However, sometimes antidepressant medicine is prescribed for bereavement to ease the depressive symptoms until they resolve. The treatment for clinical depression depends on the severity of your symptoms but typically includes antidepressant  medicine, counseling with a mental health professional, or a combination of both. Your caregiver will help to determine what treatment is best for you. Depression caused by physical illness usually goes away with appropriate medical treatment of the illness. If prescription medicine is causing depression, talk with your caregiver about stopping the medicine, decreasing the dose, or substituting another medicine. Depression caused by abuse of alcohol or illicit drugs abuse goes away with abstinence from these substances. Some adults need professional help in order to stop drinking or using drugs. SEEK IMMEDIATE CARE IF:  You have thoughts about hurting yourself or others.   You lose touch with reality (have psychotic symptoms).   You are taking medicine for depression and have a serious side effect.  FOR MORE INFORMATION National Alliance on Mental Illness: www.nami.[employer] of Mental Health: http://www.maynard.net/  Document Released: 03/26/2000 Document Revised: 09/28/2011 Document Reviewed: 06/28/2011 [facility] Patient Information 2013 [place], [place].   Hidradenitis Suppurativa, Sweat Gland Abscess Hidradenitis suppurativa is a long lasting (chronic), uncommon disease of the sweat glands. With this, boil-like lumps and scarring develop in the groin, some times under the arms (axillae), and under the breasts. It may also uncommonly occur behind the ears, in the crease of the buttocks, and around the genitals.   CAUSES   The cause is from a blocking of the sweat glands. They then become infected. It may cause drainage and odor. It is not contagious. So it cannot be given to someone else. It most often shows up in puberty (about 41 to 56 years of age). But it may happen much later. It is similar to acne which is a disease of the sweat glands. This condition is slightly more common in African-Americans and women. SYMPTOMS    Hidradenitis usually starts as one or more red, tender, swellings  in the groin or under the arms (axilla).   Over a period of hours to days the lesions get larger. They often open to the skin surface, draining clear to yellow-colored fluid.   The infected area heals with scarring.  DIAGNOSIS   Your caregiver makes this diagnosis by looking at you. Sometimes cultures (growing germs on plates in the lab) may be taken. This is to see what germ (bacterium) is causing the infection.   TREATMENT    Topical germ killing medicine applied to the skin (antibiotics) are the treatment of choice. Antibiotics taken by mouth (systemic) are sometimes needed when the condition is getting worse or is severe.   Avoid tight-fitting clothing which traps moisture in.   Dirt does not cause hidradenitis and it is not caused by poor hygiene.   Involved areas should be cleaned daily using an antibacterial soap. Some patients find that the liquid form of Lever 2000, applied to the involved areas as a lotion after bathing, can help reduce the odor related to this condition.   Sometimes surgery is needed to drain infected areas or remove scarred tissue. Removal of large amounts of tissue is used only in severe cases.   Birth control pills may be helpful.   Oral retinoids (vitamin A derivatives) for 6 to 12 months which are effective for acne may also help this condition.   Weight loss will improve but not cure hidradenitis. It is made worse by being overweight.  But the condition is not caused by being overweight.   This condition is more common in people who have had acne.   It may become worse under stress.  There is no medical cure for hidradenitis. It can be controlled, but not cured. The condition usually continues for years with periods of getting worse and getting better (remission). Document Released: 11/11/2003 Document Revised: 06/21/2011 Document Reviewed: 11/27/2007 [facility] Patient Information 2013 [place], [place].

## 2012-04-04 ENCOUNTER — Ambulatory Visit: Payer: Medicare Other | Admitting: Gastroenterology

## 2012-04-19 ENCOUNTER — Other Ambulatory Visit: Payer: Self-pay | Admitting: Physician Assistant

## 2012-04-25 ENCOUNTER — Ambulatory Visit: Payer: Medicare Other | Admitting: Gastroenterology

## 2012-05-08 DIAGNOSIS — M21619 Bunion of unspecified foot: Secondary | ICD-10-CM | POA: Diagnosis not present

## 2012-05-08 DIAGNOSIS — M25579 Pain in unspecified ankle and joints of unspecified foot: Secondary | ICD-10-CM | POA: Diagnosis not present

## 2012-05-08 DIAGNOSIS — M129 Arthropathy, unspecified: Secondary | ICD-10-CM | POA: Diagnosis not present

## 2012-05-08 DIAGNOSIS — M2010 Hallux valgus (acquired), unspecified foot: Secondary | ICD-10-CM | POA: Diagnosis not present

## 2012-05-11 ENCOUNTER — Ambulatory Visit: Payer: Medicare Other | Admitting: Internal Medicine

## 2012-05-16 DIAGNOSIS — M2010 Hallux valgus (acquired), unspecified foot: Secondary | ICD-10-CM | POA: Diagnosis not present

## 2012-05-18 ENCOUNTER — Ambulatory Visit (INDEPENDENT_AMBULATORY_CARE_PROVIDER_SITE_OTHER): Payer: Medicare Other | Admitting: Internal Medicine

## 2012-05-18 ENCOUNTER — Encounter: Payer: Self-pay | Admitting: Internal Medicine

## 2012-05-18 VITALS — BP 110/68 | HR 88 | Temp 97.6°F

## 2012-05-18 DIAGNOSIS — M810 Age-related osteoporosis without current pathological fracture: Secondary | ICD-10-CM

## 2012-05-18 DIAGNOSIS — F329 Major depressive disorder, single episode, unspecified: Secondary | ICD-10-CM | POA: Diagnosis not present

## 2012-05-18 DIAGNOSIS — M949 Disorder of cartilage, unspecified: Secondary | ICD-10-CM

## 2012-05-18 DIAGNOSIS — Z1239 Encounter for other screening for malignant neoplasm of breast: Secondary | ICD-10-CM

## 2012-05-18 DIAGNOSIS — K5289 Other specified noninfective gastroenteritis and colitis: Secondary | ICD-10-CM

## 2012-05-18 DIAGNOSIS — K52839 Microscopic colitis, unspecified: Secondary | ICD-10-CM

## 2012-05-18 DIAGNOSIS — F3289 Other specified depressive episodes: Secondary | ICD-10-CM

## 2012-05-18 DIAGNOSIS — M899 Disorder of bone, unspecified: Secondary | ICD-10-CM

## 2012-05-18 MED ORDER — CALCIUM CARBONATE-VITAMIN D 500-200 MG-UNIT PO TABS: 1.0000 | ORAL_TABLET | Freq: Three times a day (TID) | ORAL | Status: DC

## 2012-05-18 MED ORDER — VITAMIN D 1000 UNITS PO TABS: 1000.0000 [IU] | ORAL_TABLET | Freq: Every day | ORAL | Status: DC

## 2012-05-18 MED ORDER — CITALOPRAM HYDROBROMIDE 20 MG PO TABS: 20.0000 mg | ORAL_TABLET | Freq: Every day | ORAL | Status: DC

## 2012-05-18 MED ORDER — PREDNISONE (PAK) 10 MG PO TABS: 10.0000 mg | ORAL_TABLET | ORAL | Status: DC

## 2012-05-18 NOTE — Progress Notes (Signed)
  Subjective:    Patient ID: Monique Swanson, female    DOB: May 08, 1955, 57 y.o.   MRN: [id number]  HPI  Here for follow up - reviewed chronic medical issues and interval events:    Chronic diarrhea - microscopic colitis dx 02/2009 improved but not resolved on tx, worse off meds and worse with emotional stress Takes imodium + lomotil, but takes > 10/d ER visit 03/10/12 for diarrhea and GI visit 03/14/12 for same -  meds rx'd for lymphocytic colitis 03/2012 with improved diarrhea symptoms, but limited by cost   Depression, chronic - exac by sudden death of spouse 09-08-09 - also other family illness and stressors ongoing; on paxil since Sep 08, 2009, prev on prozac, increasing symptoms of sadness and tearfulness -unable to begin SNRI 04/2012 as rx'd due to cost. not in counseling at this time - unable to afford same   Arthritis, diffuse - reports daily tylenol instead of aleve or advil  - insufficient pain relief Affects hands, ankles, knees- Not associated with swelling   Past Medical History  Diagnosis Date  . Osteoarthritis   . CARCINOMA, SQUAMOUS CELL 2007-09-09    s/p rescrtion from R nose bridge  . DEPRESSION   . Headache   . Irritable bowel syndrome   . OSTEOPENIA   . Microscopic colitis dx 02/2009    chronic diarrhea    Review of Systems  Constitutional: Positive for fatigue. Negative for fever and unexpected weight change.  Respiratory: Negative for shortness of breath and wheezing.   Gastrointestinal: Negative for abdominal pain and anal bleeding.  Psychiatric/Behavioral: Positive for dysphoric mood and decreased concentration. Negative for hallucinations, behavioral problems, confusion and self-injury. The patient is nervous/anxious.        Objective:   Physical Exam  BP 110/68  Pulse 88  Temp 97.6 F (36.4 C) (Oral)  SpO2 98% Wt Readings from Last 3 Encounters:  03/30/12 133 lb 12.8 oz (60.691 kg)  03/14/12 134 lb 6.4 oz (60.963 kg)  02/10/12 134 lb (60.782 kg)    Constitutional: She appears well-developed and well-nourished. No distress. dtr at side Cardiovascular: Normal rate, regular rhythm and normal heart sounds.  No murmur heard. No BLE edema. Pulmonary/Chest: Effort normal and breath sounds normal. No respiratory distress. She has no wheezes.  Abdominal: Soft. Bowel sounds are normal. no distension. There is no tenderness. no masses Psychiatric: She has a dysphoric mood and affect. Her behavior is normal. Judgment and thought content normal.   Lab Results  Component Value Date   WBC 6.7 03/10/2012   HGB 13.8 03/10/2012   HCT 40.9 03/10/2012   PLT 373 03/10/2012   GLUCOSE 85 03/10/2012   CHOL 193 07/22/2008   TRIG 60.0 07/22/2008   HDL 59.20 07/22/2008   LDLCALC 122* 07/22/2008   ALT 10 03/10/2012   AST 17 03/10/2012   NA 135 03/10/2012   K 3.4* 03/10/2012   CL 101 03/10/2012   CREATININE 0.83 03/10/2012   BUN 9 03/10/2012   CO2 21 03/10/2012   TSH 1.95 02/21/2009      Assessment & Plan:  See problem list. Medications and labs reviewed today.

## 2012-05-18 NOTE — Assessment & Plan Note (Signed)
Manifest as chronic diarrhea - dx 02/2009 colo Exac 02/2012 with ER visit - GI adjusting steroids> symptoms improving, until out of samples/cost  tx with systemic ped taper x 12d pending GI follow up for cost affordable alternative tx

## 2012-05-18 NOTE — Assessment & Plan Note (Signed)
Chronic disease, exacerbated by social and family stressors  Reviewed symptoms, increasing tearfulness Change paxil to citalopram (unable to afford venlafex 04/2012) increased traz 04/2012 and continue same imipramine dose Verified no si/hi, support offered follow up 6 weeks, sooner if worse

## 2012-05-18 NOTE — Patient Instructions (Signed)
It was good to see you today. stop paxil, start citalopram (celexa) once daily Use prednisone taper as discussed for next 12 days to help diarrhea symptoms Your prescription(s) have been given to you to submit to any pharmacy. Please take as directed and contact our office if you believe you are having problem(s) with the medication(s). Followup with Dr. Christella Hartigan as discussed for medicine options to control diarrhea Continue calcium and vitamin D as discussed Will schedule bone density scan to further evaluate for osteoporosis Please schedule followup in 6-8 weeks, call sooner if problems.

## 2012-05-18 NOTE — Assessment & Plan Note (Signed)
05/2012 podiatry surgery: R bunion - reports "very soft bones" Check DEXA now. Advised Vit D and calcium

## 2012-05-29 ENCOUNTER — Other Ambulatory Visit: Payer: Self-pay | Admitting: Internal Medicine

## 2012-06-01 ENCOUNTER — Other Ambulatory Visit: Payer: Medicare Other

## 2012-06-01 ENCOUNTER — Ambulatory Visit (INDEPENDENT_AMBULATORY_CARE_PROVIDER_SITE_OTHER)
Admission: RE | Admit: 2012-06-01 | Discharge: 2012-06-01 | Disposition: A | Discharge status: Home / Self Care | Payer: Medicare Other | Source: Ambulatory Visit | Attending: Internal Medicine | Admitting: Internal Medicine

## 2012-06-01 DIAGNOSIS — M810 Age-related osteoporosis without current pathological fracture: Secondary | ICD-10-CM | POA: Diagnosis not present

## 2012-06-02 DIAGNOSIS — M2010 Hallux valgus (acquired), unspecified foot: Secondary | ICD-10-CM | POA: Diagnosis not present

## 2012-06-05 ENCOUNTER — Encounter: Payer: Self-pay | Admitting: Internal Medicine

## 2012-06-05 MED ORDER — ALENDRONATE SODIUM 70 MG PO TABS: 70.0000 mg | ORAL_TABLET | ORAL | Status: DC

## 2012-06-05 NOTE — Addendum Note (Signed)
Addended by: Rene Paci A on: 06/05/2012 05:03 PM   Modules accepted: Orders

## 2012-06-06 DIAGNOSIS — Z1231 Encounter for screening mammogram for malignant neoplasm of breast: Secondary | ICD-10-CM | POA: Diagnosis not present

## 2012-06-12 ENCOUNTER — Encounter: Payer: Self-pay | Admitting: Internal Medicine

## 2012-06-12 DIAGNOSIS — R928 Other abnormal and inconclusive findings on diagnostic imaging of breast: Secondary | ICD-10-CM | POA: Diagnosis not present

## 2012-06-12 DIAGNOSIS — Z09 Encounter for follow-up examination after completed treatment for conditions other than malignant neoplasm: Secondary | ICD-10-CM | POA: Diagnosis not present

## 2012-06-12 LAB — HM MAMMOGRAPHY

## 2012-06-14 ENCOUNTER — Telehealth: Payer: Self-pay | Admitting: *Deleted

## 2012-06-14 ENCOUNTER — Other Ambulatory Visit: Payer: Self-pay | Admitting: Physician Assistant

## 2012-06-14 NOTE — Telephone Encounter (Signed)
Faxed signed prescription by Mike Gip PA-C of Lomotil with a note for the pharmacist. This patient must make an appointment to see either Dr. Christella Hartigan or Mike Gip PA-C before further refills.  She cancelled two appointments in Dec 2013 and Jan 2014.

## 2012-06-14 NOTE — Telephone Encounter (Signed)
Advised pharmacist we are faxing a prescription for Lomotil, no refills for this patient . Amy Esterwood PA-C signed the prescription. This patient cancelled an appointment in Dec 2013 and Jan 2014 with Dr. Rob Bunting.  Amy said no more refills until she sees either Dr. Christella Hartigan or Amy in the office.  I noted this on the prescription .

## 2012-06-19 ENCOUNTER — Other Ambulatory Visit: Payer: Self-pay | Admitting: Physician Assistant

## 2012-07-06 ENCOUNTER — Other Ambulatory Visit: Payer: Self-pay | Admitting: Physician Assistant

## 2012-07-07 ENCOUNTER — Telehealth: Payer: Self-pay | Admitting: Gastroenterology

## 2012-07-07 NOTE — Telephone Encounter (Signed)
Diarrhea 3-4 times at night, she is taking imodium and lomotil.  Pt has taken  prednisone and flagyl  from a prescription she has gotten previously  Pt had an appt in Jan with Dr Christella Hartigan but cx because of the weather.  Appt made with Amy for 07/10/12

## 2012-07-10 ENCOUNTER — Ambulatory Visit (INDEPENDENT_AMBULATORY_CARE_PROVIDER_SITE_OTHER): Payer: Medicare Other | Admitting: Physician Assistant

## 2012-07-10 ENCOUNTER — Encounter: Payer: Self-pay | Admitting: Physician Assistant

## 2012-07-10 VITALS — BP 88/68 | HR 88 | Ht 61.75 in | Wt 131.1 lb

## 2012-07-10 DIAGNOSIS — K52832 Lymphocytic colitis: Secondary | ICD-10-CM

## 2012-07-10 DIAGNOSIS — K5289 Other specified noninfective gastroenteritis and colitis: Secondary | ICD-10-CM | POA: Diagnosis not present

## 2012-07-10 DIAGNOSIS — R197 Diarrhea, unspecified: Secondary | ICD-10-CM | POA: Diagnosis not present

## 2012-07-10 MED ORDER — PREDNISONE 20 MG PO TABS: 20.0000 mg | ORAL_TABLET | Freq: Every day | ORAL | Status: DC

## 2012-07-10 MED ORDER — DIPHENOXYLATE-ATROPINE 2.5-0.025 MG PO TABS: 1.0000 | ORAL_TABLET | Freq: Four times a day (QID) | ORAL | Status: DC

## 2012-07-10 MED ORDER — ALIGN PO CAPS: 1.0000 | ORAL_CAPSULE | Freq: Every day | ORAL | Status: DC

## 2012-07-10 MED ORDER — COLESTIPOL HCL 1 G PO TABS: 1.0000 g | ORAL_TABLET | Freq: Every day | ORAL | Status: DC

## 2012-07-10 NOTE — Patient Instructions (Addendum)
You have been given a separate informational sheet regarding your tobacco use, the importance of quitting and local resources to help you quit. We faxed the Lomotil prescription to [facility] [address]. We sent prescriptions for Prednisone, and Colestid generic. We have given you samples of Align, take 1 daily for 14 days.  We made you an appointment with Amy Esterwood PA-C for 07-31-2012 at 10 AM.

## 2012-07-10 NOTE — Progress Notes (Signed)
i agree with the plan outlined in this note

## 2012-07-10 NOTE — Progress Notes (Signed)
Subjective:    Patient ID: Monique Swanson, female    DOB: 03/06/1956, 57 y.o.   MRN: [id number]  HPI  Monique Swanson is a 57 year old white female known to Dr. Wendall Papa who has a diagnosis of lymphocytic colitis which was made at the time of colonoscopy in 2010 . At that time she was to eat with Anticort with good response and had not been seen for about 3 years. She was in in December of 2013 with recurrent of diarrhea over the preceding couple of weeks. She had an emergency room visit November of 2013 also for diarrhea and crampy lower abdominal pain. On further discussion she related that she actually been having 3 or 4 loose bowel movements per day over the past couple of years and had been taking Imodium up to 8 daily on a regular basis. At that time she had stool cultures done including stool for C. difficile PCR are all of which were unremarkable area she was started on an empiric course of Flagyl, given samples of Align, and ultimately we treated her with a course of  ULceris samples as Entocort was going to be prohibitively extensive. She was to come back in for followup in January but canceled that appointment. She says she's not sure that she responded to get way to the antibiotics or the Ulceris-and has still been taking Imodium and Lomotil on a regular basis. She says she takes up to 8 Imodium per day and has been taking 8 or 9 Lomotil per day over the past several weeks. She says she's been worse over the past few weeks and having up to 10-12 bowel movements per day last week. She started taking some of her sisters medication which he had for IBS which she states is Colestid and actually has seen significant improvement. She says she was taking one tablet per day last week and then over the past 3 days has not had any diarrhea or bowel movement. She had been having crampy lower abdominal pain, no fever and again energy level generally poor. She did have a celiac testing with TTG done in 2010 and this was  negative Review of her medications with reference to lymphocytic colitis show that she is taking Paxil which is listed as a potential trigger however she says she was taken off of Paxil a month or so ago and started on another antidepressant in felt absolutely horrible with change in mental status etc. and was placed back on her Paxil .    Review of Systems  Constitutional: Positive for appetite change and fatigue.  HENT: Negative.   Eyes: Negative.   Respiratory: Negative.   Cardiovascular: Negative.   Gastrointestinal: Positive for abdominal pain and diarrhea.  Endocrine: Negative.   Genitourinary: Negative.   Allergic/Immunologic: Negative.   Neurological: Negative.   Hematological: Negative.   Psychiatric/Behavioral: Negative.    Outpatient Prescriptions Prior to Visit  Medication Sig Dispense Refill  . acetaminophen (TYLENOL) 500 MG tablet Take 1,000 mg by mouth daily. Patient states she has been taking 4 A day      . alendronate (FOSAMAX) 70 MG tablet Take 1 tablet (70 mg total) by mouth every 7 (seven) days. Take with a full glass of water on an empty stomach.  4 tablet  11  . bifidobacterium infantis (ALIGN) capsule Take 1 capsule by mouth daily.  21 capsule  0  . calcium-vitamin D (OSCAL 500/200 D-3) 500-200 MG-UNIT per tablet Take 1 tablet by mouth 3 (three) times daily.      Marland Kitchen  cholecalciferol (VITAMIN D) 1000 UNITS tablet Take 1 tablet (1,000 Units total) by mouth daily.      . clonazePAM (KLONOPIN) 0.5 MG tablet Take 0.25 mg by mouth daily as needed.   30 tablet    . dicyclomine (BENTYL) 10 MG capsule Take 1 capsule (10 mg total) by mouth 4 (four) times daily -  before meals and at bedtime.  90 capsule  0  . imipramine (TOFRANIL) 50 MG tablet Take 1 tablet (50 mg total) by mouth at bedtime.  30 tablet  11  . metroNIDAZOLE (FLAGYL) 250 MG tablet Take 1 tablet (250 mg total) by mouth 3 (three) times daily.  40 tablet  0  . traZODone (DESYREL) 100 MG tablet Take 1 tablet (100  mg total) by mouth at bedtime.  30 tablet  11  . diphenoxylate-atropine (LOMOTIL) 2.5-0.025 MG per tablet TAKE 1 TABLET BY MOUTH EVERY 4 HOURS AS NEEDED FOR DIARRHEA OR LOOSE STOOLS * CAN TAKE UP TO 8 TABS  100 tablet  0  . aspirin EC 81 MG tablet Take 1 tablet (81 mg total) by mouth daily.  150 tablet  2  . citalopram (CELEXA) 20 MG tablet Take 1 tablet (20 mg total) by mouth daily.  30 tablet  5  . oxyCODONE-acetaminophen (PERCOCET/ROXICET) 5-325 MG per tablet Take 1 tablet by mouth every 6 (six) hours as needed.      . predniSONE (STERAPRED UNI-PAK) 10 MG tablet Take 1 tablet (10 mg total) by mouth as directed. As directed x 12 days  42 tablet  0   No facility-administered medications prior to visit.   Allergies  Allergen Reactions  . Tetracycline Other (See Comments)    Ulcers on tongue   Patient Active Problem List  Diagnosis  . CARCINOMA, SQUAMOUS CELL  . DEFICIENCY OF OTHER VITAMINS  . DEPRESSION  . ARTHRITIS  . Osteoporosis, unspecified  . HEADACHE  . DIARRHEA  . Microscopic colitis  . Tobacco abuse   History  Substance Use Topics  . Smoking status: Current Every Day Smoker -- 35 years    Types: Cigarettes  . Smokeless tobacco: Never Used     Comment: Widowed form given 03-14-12  . Alcohol Use: Yes     Comment: socially   family history includes Asthma in her father; Coronary artery disease in her mother; Dementia in her mother; Diabetes in her mother and sister; Heart disease in her sister; Irritable bowel syndrome in her sister; and Ovarian cancer in her sister.  There is no history of Colon cancer.     Objective:   Physical Exam well-developed white female in no acute distress, blood pressure 88/68 pulse 88 height 5 foot 1 weight 131. HEENT; nontraumatic normocephalic EOMI PERRLA sclera anicteric,Neck; Supple no JVD, Cardiovascula;r regular rate and rhythm with S1-S2 no murmur or gallop, Pulmonary; clear bilaterally, Abdomen; soft basically nontender there is no  palpable mass or hepatosplenomegaly bowel sounds are active, Rectal; exam not done, Extremities; no clubbing cyanosis or edema skin warm and dry, Psych; mood and affect normal and appropriate.        Assessment & Plan:  #65  57 year old female with lymphocytic colitis diagnosed 2010 with persistent symptoms are aggressive over the past month. She had a course of Flagyl and a short course of Ulceris in  December of 2013 with partial response, but at this point is requiring fairly high doses of Imodium and Lomotil on a daily basis Patient gave herself a trial of Colestid and does feel  that this is been beneficial.  Plan; continue Colestid 1 g daily and emphasize that this needs to be taken at least 2 hours away from any other medications. Start prednisone 20 mg by mouth every morning. She will remain on this dose until followup office visit in 3 weeks, and if responding we'll plan a very slow taper Refill Lomotil for when necessary use no more than 8 per day Restart align one by mouth daily Continue Bentyl 10 mg by mouth 3 times a day when necessary cramping Followup office visit with myself or Dr. Christella Hartigan in 3 weeks-if she has not had any significant response will likely need a repeat colonoscopy, with biopsies, and may also need to give her a trial of gluten-free diet

## 2012-07-26 ENCOUNTER — Other Ambulatory Visit: Payer: Self-pay | Admitting: Internal Medicine

## 2012-07-26 ENCOUNTER — Other Ambulatory Visit: Payer: Self-pay | Admitting: Physician Assistant

## 2012-07-29 ENCOUNTER — Other Ambulatory Visit: Payer: Self-pay | Admitting: Physician Assistant

## 2012-07-31 ENCOUNTER — Ambulatory Visit (INDEPENDENT_AMBULATORY_CARE_PROVIDER_SITE_OTHER): Payer: Medicare Other | Admitting: Physician Assistant

## 2012-07-31 ENCOUNTER — Encounter: Payer: Self-pay | Admitting: Physician Assistant

## 2012-07-31 VITALS — BP 90/60 | HR 68 | Ht 61.75 in | Wt 132.0 lb

## 2012-07-31 DIAGNOSIS — K5289 Other specified noninfective gastroenteritis and colitis: Secondary | ICD-10-CM

## 2012-07-31 DIAGNOSIS — K52832 Lymphocytic colitis: Secondary | ICD-10-CM | POA: Insufficient documentation

## 2012-07-31 MED ORDER — PREDNISONE 20 MG PO TABS: ORAL_TABLET | ORAL | Status: DC

## 2012-07-31 NOTE — Patient Instructions (Addendum)
We sent a prescription for Prednisone 10 mg tablets to [facility] [address] Take 15 mg ( 1/ and 1/2 ) tab for 14 days. Then 10 mg  ( 1 tab )  Daily x 14 days Then stay on 5 mg ( 1/2 tab ) until seen in the office for the next office visit.   Decrease Imodium to 2 tab twice daily for 14 days. Then if doing well decrease to 1 tab twice daily.    We made you an appointment with Dr. Rob Bunting on 08-28-2012 at 9:45 Am.

## 2012-07-31 NOTE — Progress Notes (Signed)
Subjective:    Patient ID: Monique Swanson, female    DOB: [birth year], 57 y.o.   MRN: [id number]  HPI Monique Swanson is a pleasant 57 year old white female known to Dr. Wendall Papa with diagnosis of lymphocytic colitis made at the time of colonoscopy in 2010. She was initially treated with Entocort with good response and remained asymptomatic for about 3 years. She was seen back in December of 2013 with recurrence of diarrhea over the preceding couple of weeks and actually had an emergency room visit in November for diarrhea and crampy lower abdominal  pain. She been taking Imodium regularly up to 8 per day. She was given an empiric course of Flagyl and then ultimately took a course of Ulceris  because she could not afford Entocort. Stool studies were negative at that time including C. difficile PCR She was not seen again for about 3 months and when she came back in March stated that she wasn't sure that she responded very well to the use ferrous or the antibiotics and had been taking 8 or 9 Imodium or Lomotil every day. At that point she says she was actually worse than having up to 10-12 bowel movements per day over the week previous. She decided to take some of her sisters medication she had been given for IBS which was Colestid and actually found this to be quite helpful. Patient had prior celiac testing which was negative. Meds were  reviewed and she is on Paxil  Which  is listed as a potential trigger. She had been tried on another antidepressant within the past few months and says she felt absolutely horrible  ,with change in mental status etc. and it was decided that he should remain on her Paxil. At last office visit  March 31, she was started on a course of low-dose prednisone 20 mg daily and also continued on Colestid 1 g daily. She was specifically instructed to take this at least 2 hours away from any other medication. Comes back in today stating that she's been doing well. She has been using a Colestid  once daily, and remains on prednisone 20 mg by mouth daily over the past 2 weeks. She says she's having one bowel movement daily or every other day and that this is a formed stool. She has not had any abdominal cramping or pain. Her appetite has been fairly good. She continues to feel somewhat fatigued. On further questioning she is still taking Imodium 3 tablets twice daily . She says she tried stopping the Imodium for one day and then had diarrhea that night so has gone back on 6 per day.   Review of Systems  Constitutional: Negative.   HENT: Negative.   Eyes: Negative.   Respiratory: Negative.   Cardiovascular: Negative.   Gastrointestinal: Negative.   Endocrine: Negative.   Genitourinary: Negative.   Allergic/Immunologic: Negative.   Neurological: Negative.   Hematological: Negative.   Psychiatric/Behavioral: Negative.    Outpatient Prescriptions Prior to Visit  Medication Sig Dispense Refill  . acetaminophen (TYLENOL) 500 MG tablet Take 1,000 mg by mouth daily. Patient states she has been taking 4 A day      . alendronate (FOSAMAX) 70 MG tablet Take 1 tablet (70 mg total) by mouth every 7 (seven) days. Take with a full glass of water on an empty stomach.  4 tablet  11  . bifidobacterium infantis (ALIGN) capsule Take 1 capsule by mouth daily.  21 capsule  0  . calcium-vitamin D (OSCAL 500/200 D-3)  500-200 MG-UNIT per tablet Take 1 tablet by mouth 3 (three) times daily.      . cholecalciferol (VITAMIN D) 1000 UNITS tablet Take 1 tablet (1,000 Units total) by mouth daily.      . clonazePAM (KLONOPIN) 0.5 MG tablet Take 0.25 mg by mouth daily as needed.   30 tablet    . colestipol (COLESTID) 1 G tablet Take 1 tablet (1 g total) by mouth daily.  30 tablet  1  . dicyclomine (BENTYL) 10 MG capsule Take 1 capsule (10 mg total) by mouth 4 (four) times daily -  before meals and at bedtime.  90 capsule  0  . diphenoxylate-atropine (LOMOTIL) 2.5-0.025 MG per tablet Take 1 tablet by mouth 4 (four)  times daily.  100 tablet  2  . imipramine (TOFRANIL) 50 MG tablet TAKE 1 TABLET (50 MG TOTAL) BY MOUTH AT BEDTIME.  30 tablet  5  . loperamide (IMODIUM) 2 MG capsule Take 2 mg by mouth 4 (four) times daily as needed for diarrhea or loose stools (pt states she takes 8 a day).      Marland Kitchen PARoxetine (PAXIL) 40 MG tablet Take 40 mg by mouth at bedtime.      . traZODone (DESYREL) 100 MG tablet Take 1 tablet (100 mg total) by mouth at bedtime.  30 tablet  11  . predniSONE (DELTASONE) 20 MG tablet Take 1 tablet (20 mg total) by mouth daily.  30 tablet  1  . bifidobacterium infantis (ALIGN) capsule Take 1 capsule by mouth daily.  14 capsule  0  . metroNIDAZOLE (FLAGYL) 250 MG tablet Take 1 tablet (250 mg total) by mouth 3 (three) times daily.  40 tablet  0   No facility-administered medications prior to visit.   Allergies  Allergen Reactions  . Tetracycline Other (See Comments)    Ulcers on tongue   Patient Active Problem List  Diagnosis  . CARCINOMA, SQUAMOUS CELL  . DEFICIENCY OF OTHER VITAMINS  . DEPRESSION  . ARTHRITIS  . Osteoporosis, unspecified  . HEADACHE  . DIARRHEA  . Tobacco abuse  . Lymphocytic colitis   History  Substance Use Topics  . Smoking status: Current Every Day Smoker -- 35 years    Types: Cigarettes  . Smokeless tobacco: Never Used     Comment: Widowed form given 03-14-12  . Alcohol Use: Yes     Comment: socially      Objective:   Physical Exam well-developed white female in no acute distress blood pressure 90/60 pulse 68 height 5 foot 1 weight 132 -not further examined today discussion only        Assessment & Plan:  #63 57 year old female with lymphocytic colitis-significant improvement in exacerbation with combination of Colestid and prednisone. #2depression/anxiety  Plan; Decrease prednisone to 15 mg by mouth daily over the next 2 weeks then 10 mg by mouth daily x2 weeks then 5 mg daily until next office visit Patient is to decrease her Imodium slowly  over the next few weeks and tried to come off of the Imodium altogether She will continue Colestid 1 g by mouth daily at least 2 hours away from other meds We'll plan to followup in the office in 4-6 weeks. She is advised to call in the interim for any exacerbation of diarrhea.

## 2012-08-01 NOTE — Progress Notes (Signed)
i agree with the plans outlined in this note

## 2012-08-02 ENCOUNTER — Other Ambulatory Visit: Payer: Self-pay | Admitting: Internal Medicine

## 2012-08-17 ENCOUNTER — Ambulatory Visit (INDEPENDENT_AMBULATORY_CARE_PROVIDER_SITE_OTHER): Payer: Medicare Other | Admitting: Family Medicine

## 2012-08-17 ENCOUNTER — Encounter: Payer: Self-pay | Admitting: Family Medicine

## 2012-08-17 VITALS — BP 118/80 | Temp 97.9°F | Wt 129.0 lb

## 2012-08-17 DIAGNOSIS — N390 Urinary tract infection, site not specified: Secondary | ICD-10-CM | POA: Diagnosis not present

## 2012-08-17 DIAGNOSIS — R05 Cough: Secondary | ICD-10-CM | POA: Diagnosis not present

## 2012-08-17 DIAGNOSIS — R059 Cough, unspecified: Secondary | ICD-10-CM | POA: Diagnosis not present

## 2012-08-17 DIAGNOSIS — R300 Dysuria: Secondary | ICD-10-CM

## 2012-08-17 LAB — POCT URINALYSIS DIPSTICK
Glucose, UA: NEGATIVE
Ketones, UA: NEGATIVE
Nitrite, UA: NEGATIVE
Spec Grav, UA: 1.02
Urobilinogen, UA: 0.2
pH, UA: 6

## 2012-08-17 MED ORDER — CEFTRIAXONE SODIUM 1 G IJ SOLR
1.0000 g | Freq: Once | INTRAMUSCULAR | Status: AC
  Administered 2012-08-17: 1 g via INTRAMUSCULAR

## 2012-08-17 MED ORDER — LEVOFLOXACIN 500 MG PO TABS: 500.0000 mg | ORAL_TABLET | Freq: Every day | ORAL | Status: DC

## 2012-08-17 NOTE — Addendum Note (Signed)
Addended by: Azucena Freed on: 08/17/2012 02:58 PM   Modules accepted: Orders

## 2012-08-17 NOTE — Patient Instructions (Signed)
-  take antibiotic as directed  -drink plenty of fluids  -if worsening, nausea and vomiting, fevers persist or other concerns see a doctor immediately   -follow up with your doctor next week

## 2012-08-17 NOTE — Progress Notes (Signed)
Chief Complaint  Patient presents with  . Fever    ribs and back hurt, bladder infection symptoms     HPI:  Acute visit for Dysuria: -started 2-3 days ago -symptoms: dysuria, urinary frequency and urgency, bladder pressure, body aches- low back and ant rib pain bilat, fever, cough too - no SOB or UR symptoms otherwise -denies:Nausea, vomiting, anorexia, flank pain, blood in urine, bowel changes, SOB -hx of:-followed by GI for chronic lymphocytic colitis followed by GI on prednisone taper -hx of UTI  ROS: See pertinent positives and negatives per HPI.  Past Medical History  Diagnosis Date  . Osteoarthritis   . CARCINOMA, SQUAMOUS CELL 2009    s/p rescrtion from R nose bridge  . DEPRESSION   . Headache   . Irritable bowel syndrome   . OSTEOPENIA   . Microscopic colitis dx 02/2009    chronic diarrhea  . Osteoporosis, unspecified 09/30/2008    [facility] DEXA 05/2012: -2.7, recommended to start bisphos      Family History  Problem Relation Age of Onset  . Dementia Mother   . Coronary artery disease Mother   . Asthma Father   . Ovarian cancer Sister   . Colon cancer Neg Hx   . Diabetes Mother   . Diabetes Sister   . Heart disease Sister     x   . Irritable bowel syndrome Sister     History   Social History  . Marital Status: Widowed    Spouse Name: N/A    Number of Children: N/A  . Years of Education: N/A   Social History Main Topics  . Smoking status: Current Every Day Smoker -- 35 years    Types: Cigarettes  . Smokeless tobacco: Never Used     Comment: Widowed form given 03-14-12  . Alcohol Use: Yes     Comment: socially  . Drug Use: No  . Sexually Active: None   Other Topics Concern  . None   Social History Narrative   Widowed 08/24/09, 2 grown children. son in [place] and dtr in [place]    Current outpatient prescriptions:acetaminophen (TYLENOL) 500 MG tablet, Take 1,000 mg by mouth daily. Patient states she has been taking 4 A day, Disp: , Rfl: ;   alendronate (FOSAMAX) 70 MG tablet, Take 1 tablet (70 mg total) by mouth every 7 (seven) days. Take with a full glass of water on an empty stomach., Disp: 4 tablet, Rfl: 11;  bifidobacterium infantis (ALIGN) capsule, Take 1 capsule by mouth daily., Disp: 21 capsule, Rfl: 0 calcium-vitamin D (OSCAL 500/200 D-3) 500-200 MG-UNIT per tablet, Take 1 tablet by mouth 3 (three) times daily., Disp: , Rfl: ;  cholecalciferol (VITAMIN D) 1000 UNITS tablet, Take 1 tablet (1,000 Units total) by mouth daily., Disp: , Rfl: ;  clonazePAM (KLONOPIN) 0.5 MG tablet, Take 0.25 mg by mouth daily as needed. , Disp: 30 tablet, Rfl: ;  colestipol (COLESTID) 1 G tablet, Take 1 tablet (1 g total) by mouth daily., Disp: 30 tablet, Rfl: 1 dicyclomine (BENTYL) 10 MG capsule, Take 1 capsule (10 mg total) by mouth 4 (four) times daily -  before meals and at bedtime., Disp: 90 capsule, Rfl: 0;  diphenoxylate-atropine (LOMOTIL) 2.5-0.025 MG per tablet, Take 1 tablet by mouth 4 (four) times daily., Disp: 100 tablet, Rfl: 2;  imipramine (TOFRANIL) 50 MG tablet, TAKE 1 TABLET (50 MG TOTAL) BY MOUTH AT BEDTIME., Disp: 30 tablet, Rfl: 5 loperamide (IMODIUM) 2 MG capsule, Take 2 mg by mouth 4 (  four) times daily as needed for diarrhea or loose stools (pt states she takes 8 a day)., Disp: , Rfl: ;  PARoxetine (PAXIL) 40 MG tablet, Take 40 mg by mouth at bedtime., Disp: , Rfl: ;  PARoxetine (PAXIL) 40 MG tablet, TAKE 1 TABLET (40 MG TOTAL) BY MOUTH EVERY MORNING., Disp: 30 tablet, Rfl: 5 predniSONE (DELTASONE) 20 MG tablet, Take 15 mg ( 1 and 1/2 tab ) once daily for 14 days, then 1 tab ( 10 mg ) x 14 days,  Then stay on 1/2 tab until seen in the office., Disp: 100 tablet, Rfl: 2;  traZODone (DESYREL) 100 MG tablet, Take 1 tablet (100 mg total) by mouth at bedtime., Disp: 30 tablet, Rfl: 11;  levofloxacin (LEVAQUIN) 500 MG tablet, Take 1 tablet (500 mg total) by mouth daily., Disp: 7 tablet, Rfl: 0  EXAM:  Filed Vitals:   08/17/12 1334  BP: 118/80   Temp: 97.9 F (36.6 C)    Body mass index is 23.8 kg/(m^2).  GENERAL: vitals reviewed and listed above, alert, oriented, appears well hydrated and in no acute distress  HEENT: atraumatic, conjunttiva clear, no obvious abnormalities on inspection of external nose and ears  NECK: no obvious masses on inspection  LUNGS: clear to auscultation bilaterally, no wheezes, rales or rhonchi, good air movement  CV: HRRR, no peripheral edema  ABD: soft, BS+, NTTP, no CVA TTP  MS: moves all extremities without noticeable abnormality, TTP in low lumbar paraspinal muscles and intercostal muscle ant bilat lower ribs, no bony or rib TTP  PSYCH: pleasant and cooperative, no obvious depression or anxiety  ASSESSMENT AND PLAN:  Discussed the following assessment and plan:  Dysuria - Plan: POCT urinalysis dipstick, Urine culture, levofloxacin (LEVAQUIN) 500 MG tablet  UTI (urinary tract infection) - Plan: levofloxacin (LEVAQUIN) 500 MG tablet  Cough -afebrile here, no CVA TTP, Urine dip abd, culture pending - will tx empirically for UTI with levo which will also cover if any lung infection - though lung exam normal. -if worsening or not improving is to see a doctor immediately -follow up in 3-4 days with her doctor -Patient advised to return or notify a doctor immediately if symptoms worsen or persist or new concerns arise.  There are no Patient Instructions on file for this visit.   Kriste Basque R.

## 2012-08-18 ENCOUNTER — Telehealth: Payer: Self-pay | Admitting: Family Medicine

## 2012-08-18 MED ORDER — CIPROFLOXACIN HCL 500 MG PO TABS: 500.0000 mg | ORAL_TABLET | Freq: Two times a day (BID) | ORAL | Status: DC

## 2012-08-18 NOTE — Telephone Encounter (Signed)
Spoke with patient.

## 2012-08-18 NOTE — Telephone Encounter (Signed)
Pls advise.  

## 2012-08-18 NOTE — Telephone Encounter (Signed)
Cipro sent instead - she needs to start this today and see a doc immediately if running fevers or feeling worse.

## 2012-08-18 NOTE — Telephone Encounter (Signed)
Pt states the antibiotic rx'd (levofloxacin)  yesterday was $103.00.  Pt has no prescription coverage, and needs something else called in. [facility]

## 2012-08-20 LAB — URINE CULTURE: Colony Count: >=100000

## 2012-08-21 ENCOUNTER — Encounter: Payer: Self-pay | Admitting: Internal Medicine

## 2012-08-21 ENCOUNTER — Ambulatory Visit (INDEPENDENT_AMBULATORY_CARE_PROVIDER_SITE_OTHER)
Admission: RE | Admit: 2012-08-21 | Discharge: 2012-08-21 | Disposition: A | Discharge status: Home / Self Care | Payer: Medicare Other | Source: Ambulatory Visit | Attending: Internal Medicine | Admitting: Internal Medicine

## 2012-08-21 ENCOUNTER — Ambulatory Visit (INDEPENDENT_AMBULATORY_CARE_PROVIDER_SITE_OTHER): Payer: Medicare Other | Admitting: Internal Medicine

## 2012-08-21 VITALS — BP 92/60 | HR 85 | Temp 98.0°F | Wt 126.1 lb

## 2012-08-21 DIAGNOSIS — R05 Cough: Secondary | ICD-10-CM | POA: Diagnosis not present

## 2012-08-21 DIAGNOSIS — R079 Chest pain, unspecified: Secondary | ICD-10-CM | POA: Diagnosis not present

## 2012-08-21 DIAGNOSIS — Z720 Tobacco use: Secondary | ICD-10-CM

## 2012-08-21 DIAGNOSIS — J208 Acute bronchitis due to other specified organisms: Secondary | ICD-10-CM

## 2012-08-21 DIAGNOSIS — I209 Angina pectoris, unspecified: Secondary | ICD-10-CM | POA: Diagnosis not present

## 2012-08-21 DIAGNOSIS — N390 Urinary tract infection, site not specified: Secondary | ICD-10-CM | POA: Diagnosis not present

## 2012-08-21 DIAGNOSIS — J209 Acute bronchitis, unspecified: Secondary | ICD-10-CM

## 2012-08-21 DIAGNOSIS — I208 Other forms of angina pectoris: Secondary | ICD-10-CM

## 2012-08-21 DIAGNOSIS — F17200 Nicotine dependence, unspecified, uncomplicated: Secondary | ICD-10-CM

## 2012-08-21 DIAGNOSIS — R059 Cough, unspecified: Secondary | ICD-10-CM | POA: Diagnosis not present

## 2012-08-21 LAB — POCT URINALYSIS DIPSTICK
Bilirubin, UA: NEGATIVE
Glucose, UA: NEGATIVE
Ketones, UA: NEGATIVE
Nitrite, UA: NEGATIVE
Protein, UA: NEGATIVE
Spec Grav, UA: <=1.005
Urobilinogen, UA: 0.2
pH, UA: 5

## 2012-08-21 MED ORDER — SULFAMETHOXAZOLE-TRIMETHOPRIM 800-160 MG PO TABS: 1.0000 | ORAL_TABLET | Freq: Two times a day (BID) | ORAL | Status: DC

## 2012-08-21 MED ORDER — HYDROCODONE-HOMATROPINE 5-1.5 MG/5ML PO SYRP: 5.0000 mL | ORAL_SOLUTION | Freq: Four times a day (QID) | ORAL | Status: DC | PRN

## 2012-08-21 MED ORDER — ASPIRIN EC 325 MG PO TBEC: 325.0000 mg | DELAYED_RELEASE_TABLET | Freq: Every day | ORAL | Status: DC

## 2012-08-21 NOTE — Patient Instructions (Signed)
It was good to see you today. We have reviewed your prior records including labs and tests today Test(s) ordered today. Your results will be released to MyChart (or called to you) after review, usually within 72hours after test completion. If any changes need to be made, you will be notified at that same time. Change antibiotics - stopped the Cipro and begin Septra for your bladder symptoms use Hydromet syrup for cough and chest pain  Take aspirin 325 mg daily until stress test completed Your prescription(s) have been submitted to your pharmacy. Please take as directed and contact our office if you believe you are having problem(s) with the medication(s). we'll make referral for cardiac stress. Our office will contact you regarding appointment(s) once made. Please schedule followup in 2-4weeks, call sooner if problems.

## 2012-08-21 NOTE — Progress Notes (Signed)
Subjective:    Patient ID: Monique Swanson, female    DOB: 1955-10-27, 57 y.o.   MRN: [id number]  HPI  Patient presents to the office today for f/u of UTI and cough.  Patient was seen on 08/17/12 by Dr. Selena Batten for the same.  UTI treated with Cipro 500 mg.  Patient states that she finished her course of antibiotics yesterday.  She reports that she feels slightly better, but she is still experiencing dysuria, suprapubic pain, and low back pain.    Patient also complains of worsening cough.  Patient states that the cough began last Tuesday.  Patient states that there is associated congestion, dry mouth, and chest pain when she coughs.  The chest pain is a sharp stabbing pain when she coughs or twists.  Patient has tried cough drops at home and tylenol and ibuprofen.  The tylenol and ibuprofen seem to help her chest pain.    Patient complains about new onset of chest pain.  She states that she has had five episodes of substernal chest pain which radiates down the left arm and up to the jaw over the last month and a half.  Pain is associated with shortness of breath and dizziness.  Patient states these episodes of pain tend to last for 10 minutes each.  She takes aspirin when she has these episodes and lays down.  The pain then resolves itself.  There is no precipitating factors to these chest pain episodes.  Patient does smoke cigarettes.  She reports 5 cigarettes per day currently.  Past Medical History  Diagnosis Date  . Osteoarthritis   . CARCINOMA, SQUAMOUS CELL 2009    s/p rescrtion from R nose bridge  . DEPRESSION   . Headache   . Irritable bowel syndrome   . OSTEOPENIA   . Microscopic colitis dx 02/2009    chronic diarrhea  . Osteoporosis, unspecified 09/30/2008    LeB DEXA 05/2012: -2.7, recommended to start bisphos     Family History  Problem Relation Age of Onset  . Dementia Mother   . Coronary artery disease Mother   . Asthma Father   . Ovarian cancer Sister   . Colon cancer Neg Hx   .  Diabetes Mother   . Diabetes Sister   . Heart disease Sister     x   . Irritable bowel syndrome Sister           Review of Systems  Constitutional: Positive for fatigue. Negative for fever and chills.  HENT: Positive for congestion, rhinorrhea and postnasal drip. Negative for ear pain and sore throat.   Eyes: Negative for redness and itching.  Respiratory: Positive for cough and chest tightness. Negative for shortness of breath and wheezing.   Cardiovascular: Positive for chest pain.  Gastrointestinal: Positive for abdominal pain. Negative for nausea and vomiting.  Genitourinary: Positive for dysuria. Negative for urgency, frequency and flank pain.  Musculoskeletal: Positive for back pain.  All other systems reviewed and are negative.       Objective:   Physical Exam  Nursing note and vitals reviewed. Constitutional: She is oriented to person, place, and time. She appears well-developed and well-nourished. No distress.  HENT:  Head: Normocephalic and atraumatic.  Right Ear: Hearing normal. No tenderness. Tympanic membrane is not injected, not erythematous and not bulging. A middle ear effusion is present.  Left Ear: Hearing normal. No tenderness. Tympanic membrane is not injected, not erythematous and not bulging. A middle ear effusion is present.  Nose: Mucosal  edema present.  Mouth/Throat: Posterior oropharyngeal erythema present.  Eyes: Conjunctivae are normal. No scleral icterus.  Neck: Normal range of motion. Neck supple.  Cardiovascular: Normal rate, regular rhythm and normal heart sounds.  Exam reveals no gallop and no friction rub.   No murmur heard. Pulmonary/Chest: Effort normal and breath sounds normal. No respiratory distress. She has no wheezes. She has no rales. She exhibits tenderness.  Tenderness to the costal margin on palpation  Abdominal: Soft. Bowel sounds are normal. There is tenderness in the suprapubic area. There is CVA tenderness. There is no rigidity,  no rebound and no guarding.  Musculoskeletal: Normal range of motion.  Lymphadenopathy:    She has no cervical adenopathy.  Neurological: She is alert and oriented to person, place, and time.  Skin: Skin is warm and dry. She is not diaphoretic.  Psychiatric: She has a normal mood and affect. Her behavior is normal.   BP Readings from Last 3 Encounters:  08/21/12 92/60  08/17/12 118/80  07/31/12 90/60   Wt Readings from Last 3 Encounters:  08/21/12 126 lb 1.9 oz (57.208 kg)  08/17/12 129 lb (58.514 kg)  07/31/12 132 lb (59.875 kg)   Filed Vitals:   08/21/12 1002  BP: 92/60  Pulse: 85  Temp: 98 F (36.7 C)  TempSrc: Oral  Weight: 126 lb 1.9 oz (57.208 kg)  SpO2: 97%   Lab Results  Component Value Date   WBC 6.7 03/10/2012   HGB 13.8 03/10/2012   HCT 40.9 03/10/2012   PLT 373 03/10/2012   GLUCOSE 85 03/10/2012   CHOL 193 07/22/2008   TRIG 60.0 07/22/2008   HDL 59.20 07/22/2008   LDLCALC 122* 07/22/2008   ALT 10 03/10/2012   AST 17 03/10/2012   NA 135 03/10/2012   K 3.4* 03/10/2012   CL 101 03/10/2012   CREATININE 0.83 03/10/2012   BUN 9 03/10/2012   CO2 21 03/10/2012   TSH 1.95 02/21/2009          Assessment & Plan:  Patient presents to the office for follow-up of UTI, cough, and complains of new onset chest pain.  1.  Dysuria:  Patient presents with dysuria, suprapubic pain and pressure, and low back pain.  Dipstick and urine culture from 08/17/12 reviewed.  Dipstick done in the office today still shows 2+ leukocytes.  We will change antibiotic to Bactrim DS PO BID x 7 days.    2.  Cough:  Patient history and physical exam consistent with ? Viral URI vs. Bronchitis.  Will do chest x-ray today to rule out infiltrate and edema.  Will treat symptomatically with hydromet.  Patient instructed that she can continue to use OTC ibuprofen or tylenol as needed for relief of cough induced chest pain.  Patient was instructed to discontinue use of cigarettes if possible in the  mean time.  Chest x-ray pending at this time.  3.  New onset chest pain:  Patient's past medical history was reviewed.  HPI and physical exam consistent with ? Atypical angina.  Patient has never had a cardiac workup in the past.  Performed EKG in the office today.  EKG showed non specific T wave inversion in V2.  No evidence of STEMI today.  Will also look at CXR, and schedule a cardiac stress test to be done in the next two weeks.  Patient instructed to continue to use aspirin daily.    Patient given strict return precautions, and will return to the office in 2-4 weeks.  Will call sooner if symptoms do not resolve.    I have personally reviewed this case with PA student. I also personally examined this patient. I agree with history and findings as documented above. I reviewed, discussed and approve of the assessment and plan as listed above. Rene Paci, MD

## 2012-08-21 NOTE — Progress Notes (Signed)
Subjective:    Patient ID: Monique Swanson, female    DOB: 07/09/1955, 57 y.o.   MRN: [id number]  HPI  See CC and PA student note  Past Medical History  Diagnosis Date  . Osteoarthritis   . CARCINOMA, SQUAMOUS CELL 2009    s/p rescrtion from R nose bridge  . DEPRESSION   . Headache   . Irritable bowel syndrome   . OSTEOPENIA   . Microscopic colitis dx 02/2009    chronic diarrhea  . Osteoporosis, unspecified 09/30/2008    LeB DEXA 05/2012: -2.7, recommended to start bisphos      Review of Systems Constitutional: Negative for fever or weight change.  Respiratory: Negative for shortness of breath.   Cardiovascular: Negative for palpitations.  Gastrointestinal: Negative for abdominal pain, no bowel changes.  Musculoskeletal: Negative for gait problem or joint swelling.  Skin: Negative for rash.  Neurological: Negative for dizziness or headache.  No other specific complaints in a complete review of systems (except as listed in HPI above).     Objective:   Physical Exam BP 92/60  Pulse 85  Temp(Src) 98 F (36.7 C) (Oral)  Wt 126 lb 1.9 oz (57.208 kg)  BMI 23.27 kg/m2  SpO2 97% Wt Readings from Last 3 Encounters:  08/21/12 126 lb 1.9 oz (57.208 kg)  08/17/12 129 lb (58.514 kg)  07/31/12 132 lb (59.875 kg)   Constitutional: She appears fatigued, but well-developed and well-nourished. No distress. dtr at side HENT: Head: Normocephalic and atraumatic. Ears: B TMs ok, no erythema or effusion; Nose: Nose normal. Mouth/Throat: Oropharynx is clear and moist. No oropharyngeal exudate.  Eyes: Conjunctivae and EOM are normal. Pupils are equal, round, and reactive to light. No scleral icterus.  Neck: Normal range of motion. Neck supple. No JVD present. No thyromegaly present.  Cardiovascular: Normal rate, regular rhythm and normal heart sounds.  No murmur heard. No BLE edema. Pulmonary/Chest: Effort normal and breath sounds clear - few scattered rhonchi with deep inspiration effort. No  respiratory distress. She has no wheezes.  Abdominal: mild suprapubic tenderness. Soft. Bowel sounds are normal. She exhibits no distension. There is no r/g. no masses Musculoskeletal: costal margin tenderness to palpation anterior chest. Shoulder/neck with normal range of motion, no joint effusions. No gross deformities Neurological: She is alert and oriented to person, place, and time. No cranial nerve deficit. Coordination, balance, strength, speech and gait are normal.  Skin: Skin is warm and dry. No rash noted. No erythema.  Psychiatric: She has a normal mood and affect. Her behavior is normal. Judgment and thought content normal.   Lab Results  Component Value Date   WBC 6.7 03/10/2012   HGB 13.8 03/10/2012   HCT 40.9 03/10/2012   PLT 373 03/10/2012   GLUCOSE 85 03/10/2012   CHOL 193 07/22/2008   TRIG 60.0 07/22/2008   HDL 59.20 07/22/2008   LDLCALC 122* 07/22/2008   ALT 10 03/10/2012   AST 17 03/10/2012   NA 135 03/10/2012   K 3.4* 03/10/2012   CL 101 03/10/2012   CREATININE 0.83 03/10/2012   BUN 9 03/10/2012   CO2 21 03/10/2012   TSH 1.95 02/21/2009       Assessment & Plan:   UTI, Ecoli on 08/17/12 cx - pan sens Continued leukocytes on urine dip today Change antibiotics cipro to septra  Cough with costochondritis on exam -  viral bronchitis by history and exam -  check chest x-ray rule out infiltrate, symptomatic cough relief with Hydromet provided. Use aspirin  anti-inflammatory as for chest pain symptoms below, see next. Advised on importance of tobacco cessation because of this and CRF  New onset SSCP - intermittent events in past 5 weeks, lasting 10-15" per episode: SSCP radiating to neck atypical angina (CP events occur Regardless of exertion or activity) with multiple CAD CRF and ongoing tobacco ECG today without change from 02/10/2012 tracing: slight increase in T-wave inversion V2 but chronic anterior changes, no acute ischemic event or arrhythmia Refer for  cardiac stress testing Aspirin 325 daily between now and completion of stress test - to go to emergency room if recurrent or increase in symptoms prior to completion of testing  Tobacco abuse - >5 minutes today spent counseling patient on unhealthy effects of continued tobacco abuse and encouragement of cessation including medical options available to help the patient quit smoking.

## 2012-08-28 ENCOUNTER — Ambulatory Visit: Payer: Medicare Other | Admitting: Gastroenterology

## 2012-09-14 ENCOUNTER — Ambulatory Visit (INDEPENDENT_AMBULATORY_CARE_PROVIDER_SITE_OTHER): Payer: Medicare Other | Admitting: Physician Assistant

## 2012-09-14 DIAGNOSIS — I209 Angina pectoris, unspecified: Secondary | ICD-10-CM | POA: Diagnosis not present

## 2012-09-14 DIAGNOSIS — I208 Other forms of angina pectoris: Secondary | ICD-10-CM

## 2012-09-14 DIAGNOSIS — Z720 Tobacco use: Secondary | ICD-10-CM

## 2012-09-14 NOTE — Progress Notes (Signed)
Monique Swanson is a 57 y.o. female smoker with a strong FHx of CAD (mother with MI in her 14s) who is referred for ETT for evaluation of CP.  She has no hx of CAD, DM2, HTN, HL.  Exam unremarkable.  ECG without ischemic changes.  Patient notes exertional left chest pain with radiation to jaw, back and left arm for 2 mos.  She has assoc dyspnea and diaphoresis.  No syncope.  Exercise Treadmill Test  Pre-Exercise Testing Evaluation Rhythm: normal sinus  Rate: 78     Test  Exercise Tolerance Test Ordering MD: Valera Castle, MD  Interpreting MD: Tereso Newcomer, PA-C  Unique Test No: 1  Treadmill:  1  Indication for ETT: Atypical Angina  Contraindication to ETT: No   Stress Modality: exercise - treadmill  Cardiac Imaging Performed: non   Protocol: standard Bruce - maximal  Max BP:  127/76  Max MPHR (bpm):  164 85% MPR (bpm):  139  MPHR obtained (bpm):  136 % MPHR obtained:  83  Reached 85% MPHR (min:sec):  n/a Total Exercise Time (min-sec):  4:46  Workload in METS:  6.7 Borg Scale: 19  Reason ETT Terminated:  dyspnea    ST Segment Analysis At Rest: normal ST segments - no evidence of significant ST depression With Exercise: no evidence of significant ST depression  Other Information Arrhythmia:  No Angina during ETT:  absent (0) Quality of ETT:  non-diagnostic  ETT Interpretation:  No ischemic ST changes at Submaximal Exercise  Comments: Poor exercise tolerance. No chest pain.  She did have significant dyspnea early on in testing. Normal BP response to exercise. No ST-T changes at submaximal exercise to suggest ischemia.   Recommendations: Patient with fairly classic anginal symptoms. This ETT is non-diagnostic as she did not achieve target HR. Given risk factors and symptoms, would recommend proceeding with pharmacologic nuclear perfusion study to exclude ischemia. Will schedule patient for [facility]. Signed,  Tereso Newcomer, PA-C   09/14/2012 12:28 PM

## 2012-09-22 ENCOUNTER — Ambulatory Visit (INDEPENDENT_AMBULATORY_CARE_PROVIDER_SITE_OTHER): Payer: Medicare Other | Admitting: Gastroenterology

## 2012-09-22 ENCOUNTER — Encounter: Payer: Self-pay | Admitting: Gastroenterology

## 2012-09-22 ENCOUNTER — Telehealth: Payer: Self-pay | Admitting: Gastroenterology

## 2012-09-22 VITALS — BP 96/52 | HR 92 | Ht 61.75 in | Wt 123.5 lb

## 2012-09-22 DIAGNOSIS — R197 Diarrhea, unspecified: Secondary | ICD-10-CM | POA: Diagnosis not present

## 2012-09-22 MED ORDER — COLESEVELAM HCL 625 MG PO TABS: 1250.0000 mg | ORAL_TABLET | Freq: Two times a day (BID) | ORAL | Status: DC

## 2012-09-22 NOTE — Progress Notes (Signed)
Review of gastrointestinal problems:  1. Lymphocytic, microscopic colitis. Diarrhea for 10 years, eventually colonoscopy December, 2010 by Dr. Ardis Hughs. Essentially normal macroscopically however biopsies showed lymphocytic colitis. Budesonide was started With good improvement, added Imodium As well. February, 2011: taking two Entocort pills a day, for Imodium pills a day with great results. April, 2011 Off Entocort, takes 4-6 Imodium a day with great results.  Seen by Nicoletta Ba in 2011, 2013.  Variety of antidiarrheal agents tried.     HPI: This is a  very pleasant 57 year old woman whom I am seeing for the first time in 2 or 3 years. She has been seeingAmy for past couple years.   she is here with her daughter today.      She thinks colestid really helped at first.  Currently on colestid twice a day.  Still on align daily.  Takes 8-9 imodium, she stopped prednisone about a week ago.  Does not take lomotil.  Drinks 1 cup coffee per day.  No etoh.  Cannot function with her diarrhea    Past Medical History  Diagnosis Date  . Osteoarthritis   . CARCINOMA, SQUAMOUS CELL 2009    s/p rescrtion from R nose bridge  . DEPRESSION   . Headache(784.0)   . Irritable bowel syndrome   . OSTEOPENIA   . Microscopic colitis dx 02/2009    chronic diarrhea  . Osteoporosis, unspecified 09/30/2008    LeB DEXA 05/2012: -2.7, recommended to start bisphos      Past Surgical History  Procedure Laterality Date  . Appendectomy  1969  . Abdominal hysterectomy  1985  . Breast biopsy  1987    left  . Foot surgery  1990    for joint in toes; bilateral  . Tubal ligation    . Bunionectomy Right     Current Outpatient Prescriptions  Medication Sig Dispense Refill  . alendronate (FOSAMAX) 70 MG tablet Take 1 tablet (70 mg total) by mouth every 7 (seven) days. Take with a full glass of water on an empty stomach.  4 tablet  11  . aspirin EC 325 MG tablet Take 1 tablet (325 mg total) by mouth daily.   100 tablet  3  . bifidobacterium infantis (ALIGN) capsule Take 1 capsule by mouth daily.  21 capsule  0  . calcium-vitamin D (OSCAL 500/200 D-3) 500-200 MG-UNIT per tablet Take 1 tablet by mouth 3 (three) times daily.      . cholecalciferol (VITAMIN D) 1000 UNITS tablet Take 1 tablet (1,000 Units total) by mouth daily.      . clonazePAM (KLONOPIN) 0.5 MG tablet Take 0.25 mg by mouth daily as needed.   30 tablet    . colestipol (COLESTID) 1 G tablet Take 1.5 g by mouth daily.      Marland Kitchen dicyclomine (BENTYL) 10 MG capsule Take 1 capsule (10 mg total) by mouth 4 (four) times daily -  before meals and at bedtime.  90 capsule  0  . HYDROcodone-homatropine (HYDROMET) 5-1.5 MG/5ML syrup Take 5 mLs by mouth every 6 (six) hours as needed for cough.  120 mL  0  . ibuprofen (ADVIL,MOTRIN) 200 MG tablet Take 600 mg by mouth 2 (two) times daily.      Marland Kitchen imipramine (TOFRANIL) 50 MG tablet TAKE 1 TABLET (50 MG TOTAL) BY MOUTH AT BEDTIME.  30 tablet  5  . loperamide (IMODIUM) 2 MG capsule Take 2 mg by mouth 4 (four) times daily as needed for diarrhea or loose stools (pt  states she takes 8-10 times a day).       Marland Kitchen sulfamethoxazole-trimethoprim (SEPTRA DS) 800-160 MG per tablet Take 1 tablet by mouth 2 (two) times daily.  14 tablet  0  . traZODone (DESYREL) 100 MG tablet Take 1 tablet (100 mg total) by mouth at bedtime.  30 tablet  11   No current facility-administered medications for this visit.    Allergies as of 09/22/2012 - Review Complete 09/22/2012  Allergen Reaction Noted  . Tetracycline Other (See Comments)     Family History  Problem Relation Age of Onset  . Dementia Mother   . Coronary artery disease Mother   . Asthma Father   . Ovarian cancer Sister   . Colon cancer Neg Hx   . Diabetes Mother   . Diabetes Sister   . Heart disease Sister     x   . Irritable bowel syndrome Sister     History   Social History  . Marital Status: Widowed    Spouse Name: N/A    Number of Children: N/A  .  Years of Education: N/A   Occupational History  . Not on file.   Social History Main Topics  . Smoking status: Current Every Day Smoker -- 35 years    Types: Cigarettes  . Smokeless tobacco: Never Used     Comment: Widowed form given 03-14-12  . Alcohol Use: Yes     Comment: socially  . Drug Use: No  . Sexually Active: Not on file   Other Topics Concern  . Not on file   Social History Narrative   Widowed 08/24/09, 2 grown children. son in [place] and dtr in [place]      Physical Exam: BP 96/52  Pulse 92  Ht 5' 1.75" (1.568 m)  Wt 123 lb 8 oz (56.019 kg)  BMI 22.78 kg/m2 Constitutional: generally well-appearing Psychiatric: alert and oriented x3 Abdomen: soft, nontender, nondistended, no obvious ascites, no peritoneal signs, normal bowel sounds     Assessment and plan: 57 y.o. female with Lymphocytic colitis, diarrhea     Is not clear that Entocort ever really helped her. Today she says it never did. Previous notes seen to suggest it did at times however. She came off of prednisone recently and while she was on it it seemed that she was feeling better however she believes that was just from the Colestid that was also started around the same time. She currently takes 8 Imodium a day, to Colestid pills a day. I'm going to start her on WelChol 3 pills twice daily for its constipating side effect. I think I would also like to agree look at her colon given the difficulty of controlling her lymphocytic colitis which is usually fairly easy to control I wonder if she has something else going on. Perhaps ulcerative colitis, Crohn's disease. She has an ongoing cardiac workup for chest pains and it would not be safe to undergo colonoscopy right now. She will call and the workup is done and we will get it scheduled for her.  Mac sedation.

## 2012-09-22 NOTE — Telephone Encounter (Signed)
Dr Christella Hartigan the pt can not afford welchol what else can she try?

## 2012-09-22 NOTE — Patient Instructions (Addendum)
You have been given a separate informational sheet regarding your tobacco use, the importance of quitting and local resources to help you quit. You will be set up for a colonoscopy (LEC, MAC sedation) for diarrhea (this should wait until after your cardiac workup is completed). New prescription called in for welchol 2 pills twice daily.  Stay on all the other anti-diarrheal meds as well.                                               We are excited to introduce MyChart, a new best-in-class service that provides you online access to important information in your electronic medical record. We want to make it easier for you to view your health information - all in one secure location - when and where you need it. We expect MyChart will enhance the quality of care and service we provide.  When you register for MyChart, you can:    View your test results.    Request appointments and receive appointment reminders via email.    Request medication renewals.    View your medical history, allergies, medications and immunizations.    Communicate with your physician's office through a password-protected site.    Conveniently print information such as your medication lists.  To find out if MyChart is right for you, please talk to a member of our clinical staff today. We will gladly answer your questions about this free health and wellness tool.  If you are age 57 or older and want a member of your family to have access to your record, you must provide written consent by completing a proxy form available at our office. Please speak to our clinical staff about guidelines regarding accounts for patients younger than age 12.  As you activate your MyChart account and need any technical assistance, please call the MyChart technical support line at [phone]) or email your question to [email or web address]. If you email your question(s), please include your name, a return phone number and  the best time to reach you.  If you have non-urgent health-related questions, you can send a message to our office through MyChart at [email or web address]. If you have a medical emergency, call 911.  Thank you for using MyChart as your new health and wellness resource!   MyChart licensed from [employer],  4540-9811. Patents Pending.

## 2012-09-23 ENCOUNTER — Telehealth: Payer: Self-pay | Admitting: Gastroenterology

## 2012-09-23 NOTE — Telephone Encounter (Signed)
I spoke with the patient and her daughter.  She is having more problems with diarrhea.  I reviewed Dr. Ardis Hughs' notes with this patient.  She has a history of lymphocytic colitis that is difficult to control.  The history is very confusing about the efficacy of medications in the past.  She was tried on budesonide in the past, but she know reports that she never took the medication as a result of cost. Recently she was tapered off of steroids and it was beneficial for her.  Dr. Ardis Hughs ordered Welchol, but she cannot afford the medication.  I instructed the patient to take 20 mg of prednisone, cholestyramine, and Imodium.  Hopefully this can control her symptoms.  If she is not better she is to contact Dr. Ardis Hughs.

## 2012-09-25 NOTE — Telephone Encounter (Signed)
The patient has been notified of this information and all questions answered.

## 2012-09-25 NOTE — Telephone Encounter (Signed)
Monique Swanson, can you check on her.  I agree that prednisone retrial is a good idea.  He started her at 69m per day, I'd like her to taper by 518mevery two weeks and then stop at 1029mer day and do not taper any further.  She will need rov with me in 4-5 weeks.  If she responds to steroids, my plan with be to keep try to get her on lowest possible effective dose (5 or 29m69md?)

## 2012-09-26 ENCOUNTER — Ambulatory Visit (HOSPITAL_COMMUNITY): Payer: Medicare Other | Attending: Cardiovascular Disease | Admitting: Radiology

## 2012-09-26 ENCOUNTER — Other Ambulatory Visit: Payer: Self-pay | Admitting: Physician Assistant

## 2012-09-26 VITALS — BP 80/59 | HR 78 | Ht 61.75 in | Wt 122.0 lb

## 2012-09-26 DIAGNOSIS — R0989 Other specified symptoms and signs involving the circulatory and respiratory systems: Secondary | ICD-10-CM | POA: Insufficient documentation

## 2012-09-26 DIAGNOSIS — F17200 Nicotine dependence, unspecified, uncomplicated: Secondary | ICD-10-CM | POA: Diagnosis not present

## 2012-09-26 DIAGNOSIS — R0789 Other chest pain: Secondary | ICD-10-CM | POA: Insufficient documentation

## 2012-09-26 DIAGNOSIS — Z8249 Family history of ischemic heart disease and other diseases of the circulatory system: Secondary | ICD-10-CM | POA: Insufficient documentation

## 2012-09-26 DIAGNOSIS — R0602 Shortness of breath: Secondary | ICD-10-CM

## 2012-09-26 DIAGNOSIS — R42 Dizziness and giddiness: Secondary | ICD-10-CM | POA: Diagnosis not present

## 2012-09-26 DIAGNOSIS — R55 Syncope and collapse: Secondary | ICD-10-CM | POA: Diagnosis not present

## 2012-09-26 DIAGNOSIS — R0609 Other forms of dyspnea: Secondary | ICD-10-CM | POA: Diagnosis not present

## 2012-09-26 DIAGNOSIS — R002 Palpitations: Secondary | ICD-10-CM | POA: Diagnosis not present

## 2012-09-26 DIAGNOSIS — I208 Other forms of angina pectoris: Secondary | ICD-10-CM

## 2012-09-26 DIAGNOSIS — R5381 Other malaise: Secondary | ICD-10-CM | POA: Insufficient documentation

## 2012-09-26 DIAGNOSIS — R000 Tachycardia, unspecified: Secondary | ICD-10-CM | POA: Diagnosis not present

## 2012-09-26 DIAGNOSIS — R079 Chest pain, unspecified: Secondary | ICD-10-CM | POA: Diagnosis not present

## 2012-09-26 DIAGNOSIS — R5383 Other fatigue: Secondary | ICD-10-CM | POA: Insufficient documentation

## 2012-09-26 MED ORDER — TECHNETIUM TC 99M SESTAMIBI GENERIC - CARDIOLITE
33.0000 | Freq: Once | INTRAVENOUS | Status: AC | PRN
  Administered 2012-09-26: 33 via INTRAVENOUS

## 2012-09-26 MED ORDER — TECHNETIUM TC 99M SESTAMIBI GENERIC - CARDIOLITE
11.0000 | Freq: Once | INTRAVENOUS | Status: AC | PRN
  Administered 2012-09-26: 11 via INTRAVENOUS

## 2012-09-26 MED ORDER — REGADENOSON 0.4 MG/5ML IV SOLN
0.4000 mg | Freq: Once | INTRAVENOUS | Status: AC
  Administered 2012-09-26: 0.4 mg via INTRAVENOUS

## 2012-09-26 NOTE — Telephone Encounter (Signed)
Patty spoke to pt.

## 2012-09-26 NOTE — Progress Notes (Signed)
[place],  [address] [phone]    Cardiology Nuclear Med Study  Monique Swanson is a 57 y.o. female     MRN : [id number]     DOB: 1956/03/13  Procedure Date: 09/26/2012  Nuclear Med Background Indication for Stress Test:  Evaluation for Ischemia History:  09/14/12 HFW:[id number], unable to reach [facility] Cardiac Risk Factors: Family History - CAD and Smoker  Symptoms:  Chest Pain/Tightness with and without Exertion (last episode of chest discomfort was last Friday, 09/22/12), Dizziness, DOE, Fatigue, Near Syncope, Palpitations and Rapid HR with Chest Pain   Nuclear Pre-Procedure Caffeine/Decaff Intake:  None NPO After: 7:00pm   Lungs:  Clear. O2 Sat: 95% on room air. IV 0.9% NS with Angio Cath:  22g  IV Site: R Wrist  IV Started by:  Eliezer Lofts, EMT-P  Chest Size (in):  36  Cup Size: C  Height: 5' 1.75" (1.568 m)  Weight:  122 lb (55.339 kg)  BMI:  Body mass index is 22.51 kg/(m^2). Tech Comments: Patient has a h/o lymphocytic colitis and after Carlton Adam was given 6' into recovery she had severe diarrhea.  Her IV had to be restarted and she was given Aminophylline 75 mg along with 500 cc normal saline.     Nuclear Med Study 1 or 2 day study: 1 day  Stress Test Type:  Treadmill/Lexiscan  Reading MD: Jenkins Rouge, MD  Order Authorizing Provider:  Richardson Dopp, PA-C; Gwendolyn Grant, MD  Resting Radionuclide: Technetium 33mSestamibi  Resting Radionuclide Dose: 11.0 mCi   Stress Radionuclide:  Technetium 977mestamibi  Stress Radionuclide Dose: 33.0 mCi           Stress Protocol Rest HR: 78 Stress HR: 125  Rest BP: 80/59 Stress BP: 92/55  Exercise Time (min): 2:00 METS: n/a   Predicted Max HR: 164 bpm % Max HR: 76.22 bpm Rate Pressure Product: 11500   Dose of Adenosine (mg):  n/a Dose of Lexiscan: 0.4 mg  Dose of Atropine (mg): n/a Dose of Dobutamine: n/a mcg/kg/min (at max HR)  Stress Test Technologist: ShLetta MoynahanCMA-N  Nuclear Technologist:  ToAnnye RuskCNMT     Rest Procedure:  Myocardial perfusion imaging was performed at rest 45 minutes following the intravenous administration of Technetium 9959mstamibi.  Rest ECG: NSR - Normal EKG  Stress Procedure:  The patient received IV Lexiscan 0.4 mg over 15-seconds with concurrent low level exercise and then Technetium 41m55mtamibi was injected at 30-seconds while the patient continued walking one more minute.  Quantitative spect images were obtained after a 45-minute delay.   Stress ECG: No significant change from baseline ECG  QPS Raw Data Images:  Normal; no motion artifact; normal heart/lung ratio. Stress Images:  Normal homogeneous uptake in all areas of the myocardium. Rest Images:  Normal homogeneous uptake in all areas of the myocardium. Subtraction (SDS):  Normal Transient Ischemic Dilatation (Normal <1.22):  1.04 Lung/Heart Ratio (Normal <0.45):  0.40  Quantitative Gated Spect Images QGS EDV:  58 ml QGS ESV:  12 ml  Impression Exercise Capacity:  Lexiscan with low level exercise. BP Response:  Normal blood pressure response. Clinical Symptoms:  Coughing, weak, dyspnea and dizzy ECG Impression:  No significant ST segment change suggestive of ischemia. Comparison with Prior Nuclear Study: No previous nuclear study performed  Overall Impression:  Normal stress nuclear study.  LV Ejection Fraction: 80%.  LV Wall Motion:  NL LV Function; NL Wall Motion  Jenkins Rouge

## 2012-09-27 ENCOUNTER — Encounter: Payer: Self-pay | Admitting: Physician Assistant

## 2012-09-29 ENCOUNTER — Telehealth: Payer: Self-pay | Admitting: *Deleted

## 2012-09-29 ENCOUNTER — Telehealth: Payer: Self-pay | Admitting: Physician Assistant

## 2012-09-29 NOTE — Telephone Encounter (Signed)
Message copied by Tarri Fuller on Fri Sep 29, 2012 11:13 AM ------      Message from: [place], [place] T      Created: Wed Sep 27, 2012  9:36 PM       Please inform patient stress test normal.      Tereso Newcomer, PA-C  9:36 PM 09/27/2012 ------

## 2012-09-29 NOTE — Telephone Encounter (Signed)
Advised the patient I spoke to the pharmacist and they are working on the prescription now, at this time 3:30 PM , 09-29-2012.

## 2012-09-29 NOTE — Telephone Encounter (Signed)
pt notfied about normal myoview, verbalized understanding

## 2012-10-11 ENCOUNTER — Other Ambulatory Visit: Payer: Self-pay | Admitting: Physician Assistant

## 2012-10-12 NOTE — Telephone Encounter (Signed)
Ok  To refill x 3 - further refills thru [facility]

## 2012-10-26 ENCOUNTER — Other Ambulatory Visit: Payer: Self-pay | Admitting: Physician Assistant

## 2012-10-30 ENCOUNTER — Telehealth: Payer: Self-pay | Admitting: Gastroenterology

## 2012-10-30 MED ORDER — COLESTIPOL HCL 1 G PO TABS: 2.0000 g | ORAL_TABLET | Freq: Every day | ORAL | Status: DC

## 2012-10-30 NOTE — Telephone Encounter (Signed)
rx has been sent per pt request 

## 2012-11-01 ENCOUNTER — Other Ambulatory Visit: Payer: Self-pay | Admitting: *Deleted

## 2012-11-10 ENCOUNTER — Ambulatory Visit (INDEPENDENT_AMBULATORY_CARE_PROVIDER_SITE_OTHER): Payer: Medicare Other | Admitting: Gastroenterology

## 2012-11-10 ENCOUNTER — Encounter: Payer: Self-pay | Admitting: Gastroenterology

## 2012-11-10 VITALS — BP 100/70 | HR 72 | Ht 61.75 in | Wt 125.6 lb

## 2012-11-10 DIAGNOSIS — R197 Diarrhea, unspecified: Secondary | ICD-10-CM

## 2012-11-10 DIAGNOSIS — K5190 Ulcerative colitis, unspecified, without complications: Secondary | ICD-10-CM | POA: Diagnosis not present

## 2012-11-10 MED ORDER — MOVIPREP 100 G PO SOLR: 1.0000 | Freq: Once | ORAL | Status: DC

## 2012-11-10 NOTE — Progress Notes (Signed)
Review of gastrointestinal problems:  1. Lymphocytic, microscopic colitis. Diarrhea for 10 years, eventually colonoscopy December, 2010 by Dr. Ardis Hughs. Essentially normal macroscopically however biopsies showed lymphocytic colitis. Budesonide was started With good improvement, added Imodium As well. February, 2011: taking two Entocort pills a day, for Imodium pills a day with great results. April, 2011 Off Entocort, takes 4-6 Imodium a day with great results. Seen by Nicoletta Ba in 2011, 2013. Variety of antidiarrheal agents tried.  2014 ROV, unclear if steroids ever helped per patient, trial of colestipol started.  Recommended repeat colonoscopy given difficulty controlling her symptoms however cardiac issues intervened.   HPI: This is a    very pleasant 57 year old woman whom I last saw month or 2 ago.  She ended up seeing a cardiologist: stress testing done. She was told her heart is OK.    She tried prednisone 25m once daily.  This helped but not completely , . From 6-8 BM per day down to 4-5 BMs per day. Has tapered to 553mpred daily.  While tapering her bowels worsened  Still takes 8-12 imodium every day.  Takes 2 colestid every day.  More severe, still with 6-8 times per day.  Always loose, +nocturnal. + green.   Past Medical History  Diagnosis Date  . Osteoarthritis   . CARCINOMA, SQUAMOUS CELL 2009    s/p rescrtion from R nose bridge  . DEPRESSION   . Headache(784.0)   . Irritable bowel syndrome   . OSTEOPENIA   . Microscopic colitis dx 02/2009    chronic diarrhea  . Osteoporosis, unspecified 09/30/2008    LeB DEXA 05/2012: -2.7, recommended to start bisphos    . Hx of cardiovascular stress test     Lex MV 6/14:  Normal, EF 80%    Past Surgical History  Procedure Laterality Date  . Appendectomy  1969  . Abdominal hysterectomy  1985  . Breast biopsy  1987    left  . Foot surgery  1990    for joint in toes; bilateral  . Tubal ligation    . Bunionectomy Right      Current Outpatient Prescriptions  Medication Sig Dispense Refill  . calcium-vitamin D (OSCAL 500/200 D-3) 500-200 MG-UNIT per tablet Take 1 tablet by mouth 3 (three) times daily.      . cholecalciferol (VITAMIN D) 1000 UNITS tablet Take 1 tablet (1,000 Units total) by mouth daily.      . clonazePAM (KLONOPIN) 0.5 MG tablet Take 0.25 mg by mouth daily as needed.   30 tablet    . colestipol (COLESTID) 1 G tablet Take 1.5 g by mouth daily.      . Marland Kitchenmipramine (TOFRANIL) 50 MG tablet TAKE 1 TABLET (50 MG TOTAL) BY MOUTH AT BEDTIME.  30 tablet  5  . loperamide (IMODIUM) 2 MG capsule Take 2 mg by mouth 4 (four) times daily as needed for diarrhea or loose stools (pt states she takes 8-10 times a day).       . Marland KitchenICRONIZED COLESTIPOL HCL 1 G tablet TAKE 1 TABLET (1 G TOTAL) BY MOUTH DAILY.  30 tablet  1  . predniSONE (DELTASONE) 5 MG tablet Take 5 mg by mouth daily.      . traZODone (DESYREL) 100 MG tablet Take 1 tablet (100 mg total) by mouth at bedtime.  30 tablet  11   No current facility-administered medications for this visit.    Allergies as of 11/10/2012 - Review Complete 11/10/2012  Allergen Reaction Noted  . Tetracycline  Other (See Comments)     Family History  Problem Relation Age of Onset  . Dementia Mother   . Coronary artery disease Mother   . Asthma Father   . Ovarian cancer Sister   . Colon cancer Neg Hx   . Diabetes Mother   . Diabetes Sister   . Heart disease Sister     x   . Irritable bowel syndrome Sister     History   Social History  . Marital Status: Widowed    Spouse Name: N/A    Number of Children: 2  . Years of Education: N/A   Occupational History  . Disabled    Social History Main Topics  . Smoking status: Current Every Day Smoker -- 35 years    Types: Cigarettes  . Smokeless tobacco: Never Used     Comment: Widowed form given 03-14-12  . Alcohol Use: Yes     Comment: socially  . Drug Use: No  . Sexually Active: Not on file   Other Topics  Concern  . Not on file   Social History Narrative   Widowed 08/24/09, 2 grown children. son in [place] and dtr in [place]      Physical Exam: BP 100/70  Pulse 72  Ht 5' 1.75" (1.568 m)  Wt 125 lb 9.6 oz (56.972 kg)  BMI 23.17 kg/m2 Constitutional: generally well-appearing Psychiatric: alert and oriented x3 Abdomen: soft, nontender, nondistended, no obvious ascites, no peritoneal signs, normal bowel sounds     Assessment and plan: 57 y.o. female with chronic diarrhea, previously proven lymphocytic colitis  Usually this condition is easier to manage on steroid medicines. Even on 20 mg of prednisone a day she had incomplete response. She is also taking to other agents that should be helping to slow her bowels without much improvement. As I discussed with her previously are like to look again at her colon, "restaging" her disease. She will go back up to 10 mg of prednisone a day. She has issues with the cost of her medicines and so Entocort is not possible for her anymore. Cholestyramine has also been not possible for her to obtain. I'm going to have her get a  GI pathogen panel as well.

## 2012-11-10 NOTE — Patient Instructions (Addendum)
One of your biggest health concerns is your smoking.  This increases your risk for most cancers and serious cardiovascular diseases such as strokes, heart attacks.  You should try your best to stop.  If you need assistance, please contact your PCP or Smoking Cessation Class at [facility] [phone]) or [facility] Quit-Line (1-800-QUIT-NOW). You will have labs checked today in the basement lab.  Please head down after you check out with the front desk  (GI pathogen panel) You will be set up for a colonoscopy (moderate sedation, LEC). Increase prednisone back to 10mg  per day.                                               We are excited to introduce MyChart, a new best-in-class service that provides you online access to important information in your electronic medical record. We want to make it easier for you to view your health information - all in one secure location - when and where you need it. We expect MyChart will enhance the quality of care and service we provide.  When you register for MyChart, you can:    View your test results.    Request appointments and receive appointment reminders via email.    Request medication renewals.    View your medical history, allergies, medications and immunizations.    Communicate with your physician's office through a password-protected site.    Conveniently print information such as your medication lists.  To find out if MyChart is right for you, please talk to a member of our clinical staff today. We will gladly answer your questions about this free health and wellness tool.  If you are age 57 or older and want a member of your family to have access to your record, you must provide written consent by completing a proxy form available at our office. Please speak to our clinical staff about guidelines regarding accounts for patients younger than age 45.  As you activate your MyChart account and need any technical assistance, please call the  MyChart technical support line at [phone]) 83-CHART [phone]) or email your question to [email or web address]. If you email your question(s), please include your name, a return phone number and the best time to reach you.  If you have non-urgent health-related questions, you can send a message to our office through MyChart at [email or web address]. If you have a medical emergency, call 911.  Thank you for using MyChart as your new health and wellness resource!   MyChart licensed from [employer],  [id number]. Patents Pending.

## 2012-11-13 ENCOUNTER — Other Ambulatory Visit: Payer: Medicare Other

## 2012-11-13 DIAGNOSIS — K5190 Ulcerative colitis, unspecified, without complications: Secondary | ICD-10-CM | POA: Diagnosis not present

## 2012-11-13 DIAGNOSIS — R197 Diarrhea, unspecified: Secondary | ICD-10-CM | POA: Diagnosis not present

## 2012-11-13 DIAGNOSIS — M129 Arthropathy, unspecified: Secondary | ICD-10-CM | POA: Diagnosis not present

## 2012-11-13 DIAGNOSIS — K5289 Other specified noninfective gastroenteritis and colitis: Secondary | ICD-10-CM | POA: Diagnosis not present

## 2012-11-14 ENCOUNTER — Encounter: Payer: Self-pay | Admitting: Gastroenterology

## 2012-11-14 ENCOUNTER — Ambulatory Visit (AMBULATORY_SURGERY_CENTER): Payer: Medicare Other | Admitting: Gastroenterology

## 2012-11-14 VITALS — BP 101/73 | HR 76 | Temp 98.7°F | Resp 27 | Ht 61.0 in | Wt 125.0 lb

## 2012-11-14 DIAGNOSIS — R197 Diarrhea, unspecified: Secondary | ICD-10-CM | POA: Diagnosis not present

## 2012-11-14 DIAGNOSIS — K5289 Other specified noninfective gastroenteritis and colitis: Secondary | ICD-10-CM

## 2012-11-14 DIAGNOSIS — R933 Abnormal findings on diagnostic imaging of other parts of digestive tract: Secondary | ICD-10-CM

## 2012-11-14 LAB — GASTROINTESTINAL PATHOGEN PANEL PCR
C. difficile Tox A/B, PCR: NEGATIVE
Campylobacter, PCR: NEGATIVE
Cryptosporidium, PCR: NEGATIVE
E coli (ETEC) LT/ST PCR: NEGATIVE
E coli (STEC) stx1/stx2, PCR: NEGATIVE
E coli 0157, PCR: NEGATIVE
Giardia lamblia, PCR: NEGATIVE
Norovirus, PCR: NEGATIVE
Rotavirus A, PCR: NEGATIVE
Salmonella, PCR: NEGATIVE
Shigella, PCR: NEGATIVE

## 2012-11-14 MED ORDER — SODIUM CHLORIDE 0.9 % IV SOLN: 500.0000 mL | INTRAVENOUS | Status: DC

## 2012-11-14 NOTE — Patient Instructions (Addendum)
One of your biggest health concerns is your smoking.  This increases your risk for most cancers and serious cardiovascular diseases such as strokes, heart attacks.  You should try your best to stop.  If you need assistance, please contact your PCP or Smoking Cessation Class at [facility] [phone]) or [place] (1-800-QUIT-NOW).  Discharge instructions given with verbal understanding. Biopsies taken. Resume previous medications. YOU HAD AN ENDOSCOPIC PROCEDURE TODAY AT [facility]: Refer to the procedure report that was given to you for any specific questions about what was found during the examination.  If the procedure report does not answer your questions, please call your gastroenterologist to clarify.  If you requested that your care partner not be given the details of your procedure findings, then the procedure report has been included in a sealed envelope for you to review at your convenience later.  YOU SHOULD EXPECT: Some feelings of bloating in the abdomen. Passage of more gas than usual.  Walking can help get rid of the air that was put into your GI tract during the procedure and reduce the bloating. If you had a lower endoscopy (such as a colonoscopy or flexible sigmoidoscopy) you may notice spotting of blood in your stool or on the toilet paper. If you underwent a bowel prep for your procedure, then you may not have a normal bowel movement for a few days.  DIET: Your first meal following the procedure should be a light meal and then it is ok to progress to your normal diet.  A half-sandwich or bowl of soup is an example of a good first meal.  Heavy or fried foods are harder to digest and may make you feel nauseous or bloated.  Likewise meals heavy in dairy and vegetables can cause extra gas to form and this can also increase the bloating.  Drink plenty of fluids but you should avoid alcoholic beverages for 24 hours.  ACTIVITY: Your care partner should take  you home directly after the procedure.  You should plan to take it easy, moving slowly for the rest of the day.  You can resume normal activity the day after the procedure however you should NOT DRIVE or use heavy machinery for 24 hours (because of the sedation medicines used during the test).    SYMPTOMS TO REPORT IMMEDIATELY: A gastroenterologist can be reached at any hour.  During normal business hours, 8:30 AM to 5:00 PM Monday through Friday, call [phone].  After hours and on weekends, please call the GI answering service at [phone] who will take a message and have the physician on call contact you.   Following lower endoscopy (colonoscopy or flexible sigmoidoscopy):  Excessive amounts of blood in the stool  Significant tenderness or worsening of abdominal pains  Swelling of the abdomen that is new, acute  Fever of 100F or higher  FOLLOW UP: If any biopsies were taken you will be contacted by phone or by letter within the next 1-3 weeks.  Call your gastroenterologist if you have not heard about the biopsies in 3 weeks.  Our staff will call the home number listed on your records the next business day following your procedure to check on you and address any questions or concerns that you may have at that time regarding the information given to you following your procedure. This is a courtesy call and so if there is no answer at the home number and we have not heard from you through the emergency  physician on call, we will assume that you have returned to your regular daily activities without incident.  SIGNATURES/CONFIDENTIALITY: You and/or your care partner have signed paperwork which will be entered into your electronic medical record.  These signatures attest to the fact that that the information above on your After Visit Summary has been reviewed and is understood.  Full responsibility of the confidentiality of this discharge information lies with you and/or your  care-partner.

## 2012-11-14 NOTE — Op Note (Signed)
[place]  [address]. [place], [id number]   COLONOSCOPY PROCEDURE REPORT  PATIENT: Monique, Swanson  MR#: [id number] BIRTHDATE: 04-09-1956 , 57  yrs. old GENDER: Female ENDOSCOPIST: Milus Banister, MD PROCEDURE DATE:  11/14/2012 PROCEDURE:   Colonoscopy with biopsy First Screening Colonoscopy - Avg.  risk and is 50 yrs.  old or older - No.  Prior Negative Screening - Now for repeat screening. N/A  History of Adenoma - Now for follow-up colonoscopy & has been > or = to 3 yrs.  N/A  Polyps Removed Today? No.  Recommend repeat exam, <10 yrs? No. ASA CLASS:   Class III INDICATIONS:Lymphocytic, microscopic colitis.  Diarrhea for 10 years, eventually colonoscopy December, 2010 by Dr.  Ardis Hughs. Essentially normal macroscopically however biopsies showed lymphocytic colitis.  Budesonide was started With good improvement, added Imodium As well.  February, 2011: taking two Entocort pills a day, for Imodium pills a day with great results.  April, 2011 Off Entocort, takes 4-6 Imodium a day with great results.  Seen by Nicoletta Ba in 2011, 2013.  Variety of antidiarrheal agents tried. 2014 ROV, unclear if steroids ever helped per patient, trial of colestipol started.  Recommended repeat colonoscopy given difficulty controlling her symptoms however cardiac issues intervened.Marland Kitchen MEDICATIONS: Fentanyl 50 mcg IV, Versed 5 mg IV, and These medications were titrated to patient response per physician's verbal order  DESCRIPTION OF PROCEDURE:   After the risks benefits and alternatives of the procedure were thoroughly explained, informed consent was obtained.  A digital rectal exam revealed no abnormalities of the rectum.   The LB [id number]  endoscope was introduced through the anus and advanced to the terminal ileum which was intubated for a short distance. No adverse events experienced.   The quality of the prep was good.  The instrument was then slowly withdrawn as the  colon was fully examined.   COLON FINDINGS: The terminal ileum was normal.  The colon was essentially normal except for mildly granular and erythematous appearing mucosa in left colon.  The right and left colon were separately biopsied and sent in separate path jars.  The examination was otherwise normal.  Retroflexed views revealed no abnormalities. The time to cecum=3 minutes 28 seconds.  Withdrawal time=8 minutes 03 seconds.  The scope was withdrawn and the procedure completed. COMPLICATIONS: There were no complications.  ENDOSCOPIC IMPRESSION: The terminal ileum was normal.  The colon was essentially normal except for mildly granular and erythematous appearing mucosa in left colon.  The right and left colon were separately biopsied and sent in separate path jars.  The examination was otherwise normal.   RECOMMENDATIONS: Await final pathology and result of the recent GI pathogen panel.   eSigned:  Milus Banister, MD 11/14/2012 11:11 AM   cc: Gwendolyn Grant, MD

## 2012-11-14 NOTE — Progress Notes (Signed)
Patient did not experience any of the following events: a burn prior to discharge; a fall within the facility; wrong site/side/patient/procedure/implant event; or a hospital transfer or hospital admission upon discharge from the facility. (G8907) Patient did not have preoperative order for IV antibiotic SSI prophylaxis. (G8918)  

## 2012-11-15 ENCOUNTER — Telehealth: Payer: Self-pay | Admitting: *Deleted

## 2012-11-15 NOTE — Telephone Encounter (Signed)
  Follow up Call-  Call back number 11/14/2012  Post procedure Call Back phone  # [phone]  Permission to leave phone message Yes     Patient questions:  Do you have a fever, pain , or abdominal swelling? no Pain Score  0 *  Have you tolerated food without any problems? yes  Have you been able to return to your normal activities? yes  Do you have any questions about your discharge instructions: Diet   no Medications  no Follow up visit  no  Do you have questions or concerns about your Care? no  Actions: * If pain score is 4 or above: No action needed, pain <4.  Patient said repeatedly"everything is fine,"

## 2012-11-23 ENCOUNTER — Encounter: Payer: Self-pay | Admitting: Internal Medicine

## 2012-11-23 DIAGNOSIS — Z09 Encounter for follow-up examination after completed treatment for conditions other than malignant neoplasm: Secondary | ICD-10-CM | POA: Diagnosis not present

## 2012-11-23 DIAGNOSIS — R928 Other abnormal and inconclusive findings on diagnostic imaging of breast: Secondary | ICD-10-CM | POA: Diagnosis not present

## 2012-11-23 LAB — HM MAMMOGRAPHY

## 2012-11-28 ENCOUNTER — Other Ambulatory Visit: Payer: Self-pay

## 2012-11-28 MED ORDER — SULFASALAZINE 500 MG PO TABS: 1000.0000 mg | ORAL_TABLET | Freq: Three times a day (TID) | ORAL | Status: DC

## 2012-12-26 ENCOUNTER — Other Ambulatory Visit: Payer: Self-pay | Admitting: Internal Medicine

## 2013-01-01 ENCOUNTER — Telehealth: Payer: Self-pay | Admitting: Gastroenterology

## 2013-01-01 NOTE — Telephone Encounter (Signed)
Dr Jacobs FYI 

## 2013-01-01 NOTE — Telephone Encounter (Signed)
Great to hear!

## 2013-01-03 ENCOUNTER — Ambulatory Visit: Payer: Medicare Other | Admitting: Gastroenterology

## 2013-01-31 ENCOUNTER — Other Ambulatory Visit: Payer: Self-pay | Admitting: Internal Medicine

## 2013-02-15 ENCOUNTER — Other Ambulatory Visit: Payer: Self-pay

## 2013-02-27 ENCOUNTER — Telehealth: Payer: Self-pay | Admitting: Gastroenterology

## 2013-02-27 ENCOUNTER — Telehealth: Payer: Self-pay | Admitting: *Deleted

## 2013-02-27 NOTE — Telephone Encounter (Signed)
Last we heard from her when she cancelled her last appt she said the medicines were working great so she should be fine for jury duty

## 2013-02-27 NOTE — Telephone Encounter (Signed)
Pt called requesting letter excusing her from Jury Duty due to her depression and Ulcerated Colitis.  Jury Duty report date Tuesday,1.6.2015.  Juror number # [id number].  Please advise

## 2013-02-27 NOTE — Telephone Encounter (Signed)
Will hold & forward to md when she return back on Thursday...Raechel Chute

## 2013-02-27 NOTE — Telephone Encounter (Signed)
Pt was notified.  

## 2013-02-27 NOTE — Telephone Encounter (Signed)
Dr Christella Hartigan do you want to write a note for jury duty?

## 2013-03-01 NOTE — Telephone Encounter (Signed)
Letter written waiting MD signature

## 2013-03-01 NOTE — Telephone Encounter (Signed)
Ok to generate letter and i will sign - but pt should be aware the court may or may not accept this medical reason

## 2013-03-02 ENCOUNTER — Encounter: Payer: Self-pay | Admitting: *Deleted

## 2013-03-02 NOTE — Telephone Encounter (Signed)
Spoke with pt advised letter ready

## 2013-03-28 ENCOUNTER — Other Ambulatory Visit: Payer: Self-pay | Admitting: Gastroenterology

## 2013-06-16 ENCOUNTER — Other Ambulatory Visit: Payer: Self-pay | Admitting: Internal Medicine

## 2013-06-26 ENCOUNTER — Other Ambulatory Visit: Payer: Self-pay | Admitting: Gastroenterology

## 2013-07-06 ENCOUNTER — Other Ambulatory Visit: Payer: Self-pay | Admitting: Internal Medicine

## 2013-08-08 ENCOUNTER — Other Ambulatory Visit: Payer: Self-pay | Admitting: Internal Medicine

## 2013-08-17 ENCOUNTER — Other Ambulatory Visit: Payer: Self-pay | Admitting: Internal Medicine

## 2013-08-20 NOTE — Telephone Encounter (Signed)
Dr Asa Lente is out of the office, okay for refill?

## 2013-09-21 DIAGNOSIS — M19079 Primary osteoarthritis, unspecified ankle and foot: Secondary | ICD-10-CM

## 2013-09-24 ENCOUNTER — Other Ambulatory Visit: Payer: Self-pay | Admitting: Gastroenterology

## 2013-10-09 ENCOUNTER — Telehealth: Payer: Self-pay | Admitting: Internal Medicine

## 2013-10-09 NOTE — Telephone Encounter (Signed)
Received 7 pages from [facility], sent to Dr. Asa Lente on 10/09/13/ss.

## 2013-10-19 ENCOUNTER — Encounter: Payer: Self-pay | Admitting: Internal Medicine

## 2013-10-21 NOTE — Telephone Encounter (Signed)
Please forward this request to HIM and let pt know we have done same thanks

## 2013-10-22 NOTE — Telephone Encounter (Signed)
Spoke with janet in medical records she ask for me to print e-mail off and send it to her and she will contact pt to pick up copy...Monique Swanson

## 2013-10-25 ENCOUNTER — Other Ambulatory Visit: Payer: Self-pay | Admitting: Gastroenterology

## 2013-10-30 ENCOUNTER — Other Ambulatory Visit: Payer: Self-pay | Admitting: Gastroenterology

## 2013-10-30 ENCOUNTER — Telehealth: Payer: Self-pay | Admitting: Gastroenterology

## 2013-10-30 MED ORDER — SULFASALAZINE 500 MG PO TABS: ORAL_TABLET | ORAL | Status: DC

## 2013-10-30 NOTE — Telephone Encounter (Signed)
Left message on machine to call back pt needs appt before any refills given

## 2013-10-30 NOTE — Telephone Encounter (Signed)
Pt aware NO further refills given unless appt is kept

## 2013-11-26 ENCOUNTER — Other Ambulatory Visit: Payer: Self-pay | Admitting: Gastroenterology

## 2013-11-28 ENCOUNTER — Other Ambulatory Visit: Payer: Self-pay | Admitting: Gastroenterology

## 2013-12-17 ENCOUNTER — Other Ambulatory Visit: Payer: Self-pay | Admitting: Internal Medicine

## 2013-12-24 ENCOUNTER — Other Ambulatory Visit: Payer: Self-pay | Admitting: Internal Medicine

## 2013-12-25 ENCOUNTER — Ambulatory Visit (INDEPENDENT_AMBULATORY_CARE_PROVIDER_SITE_OTHER): Payer: Medicare Other | Admitting: Nurse Practitioner

## 2013-12-25 DIAGNOSIS — IMO0002 Reserved for concepts with insufficient information to code with codable children: Secondary | ICD-10-CM

## 2013-12-25 NOTE — Progress Notes (Deleted)
   Subjective:    Patient ID: Monique Swanson, female    DOB: 1955-08-14, 58 y.o.   MRN: [id number]  HPI    Review of Systems     Objective:   Physical Exam        Assessment & Plan:

## 2013-12-27 ENCOUNTER — Encounter: Payer: Self-pay | Admitting: Nurse Practitioner

## 2013-12-27 DIAGNOSIS — R4689 Other symptoms and signs involving appearance and behavior: Secondary | ICD-10-CM | POA: Insufficient documentation

## 2013-12-27 NOTE — Progress Notes (Signed)
Non compliant behavior  No show 12/25/13

## 2013-12-28 ENCOUNTER — Other Ambulatory Visit: Payer: Self-pay | Admitting: Gastroenterology

## 2013-12-31 DIAGNOSIS — J209 Acute bronchitis, unspecified: Secondary | ICD-10-CM | POA: Diagnosis not present

## 2013-12-31 DIAGNOSIS — J0100 Acute maxillary sinusitis, unspecified: Secondary | ICD-10-CM | POA: Diagnosis not present

## 2013-12-31 DIAGNOSIS — R918 Other nonspecific abnormal finding of lung field: Secondary | ICD-10-CM | POA: Diagnosis not present

## 2014-01-08 ENCOUNTER — Ambulatory Visit (INDEPENDENT_AMBULATORY_CARE_PROVIDER_SITE_OTHER): Payer: Medicare Other | Admitting: Gastroenterology

## 2014-01-08 ENCOUNTER — Encounter: Payer: Self-pay | Admitting: Gastroenterology

## 2014-01-08 VITALS — BP 100/70 | HR 88 | Ht 61.0 in | Wt 141.0 lb

## 2014-01-08 DIAGNOSIS — K5289 Other specified noninfective gastroenteritis and colitis: Secondary | ICD-10-CM | POA: Diagnosis not present

## 2014-01-08 DIAGNOSIS — K52839 Microscopic colitis, unspecified: Secondary | ICD-10-CM

## 2014-01-08 MED ORDER — MESALAMINE 800 MG PO TBEC: 1600.0000 mg | DELAYED_RELEASE_TABLET | Freq: Three times a day (TID) | ORAL | Status: DC

## 2014-01-08 NOTE — Progress Notes (Signed)
Review of gastrointestinal problems:  1. Lymphocytic, microscopic colitis. Diarrhea for 10 years, eventually colonoscopy December, 2010 by Dr. Ardis Hughs. Essentially normal macroscopically however biopsies showed lymphocytic colitis. Budesonide was started With good improvement, added Imodium As well. February, 2011: taking two Entocort pills a day, for Imodium pills a day with great results. April, 2011 Off Entocort, takes 4-6 Imodium a day with great results. Seen by Nicoletta Ba in 2011, 2013. Variety of antidiarrheal agents tried. 2014 ROV, unclear if steroids ever helped per patient, trial of colestipol started. Recommended repeat colonoscopy given difficulty controlling her symptoms however cardiac issues intervened.  Repeat colonoscopy 11/2012 Ardis Hughs, slightly erythematous mucosa in left colon; biopsies confirmed microscopic colitis again; started mesalamine orally.  1 g 3 times a day helped significantly but she still had intermittent flares.   HPI: This is a very pleasant 58 year old woman whom I last saw about a year ago. Her microscopic colitis has been very well helped with oral mesalamine 3 g daily. She still does have intermittent flares of high volume diarrhea that'll last a day or 2. She tells in the past month she has even started noticing solid stools. She feels very well about her overall bowel habits. She is chronically depressed and is going to bring up seeing a therapist with her primary care physician at her next visit.    Past Medical History  Diagnosis Date  . Osteoarthritis   . CARCINOMA, SQUAMOUS CELL 2009    s/p rescrtion from R nose bridge  . DEPRESSION   . Headache(784.0)   . Irritable bowel syndrome   . OSTEOPENIA   . Microscopic colitis dx 02/2009    chronic diarrhea  . Osteoporosis, unspecified 09/30/2008    LeB DEXA 05/2012: -2.7, recommended to start bisphos    . Hx of cardiovascular stress test     Lex MV 6/14:  Normal, EF 80%    Past Surgical History   Procedure Laterality Date  . Appendectomy  1969  . Abdominal hysterectomy  1985  . Breast biopsy  1987    left  . Foot surgery  1990    for joint in toes; bilateral  . Tubal ligation    . Bunionectomy Right     Current Outpatient Prescriptions  Medication Sig Dispense Refill  . albuterol (PROVENTIL HFA;VENTOLIN HFA) 108 (90 BASE) MCG/ACT inhaler Inhale 2 puffs into the lungs every 6 (six) hours as needed for wheezing or shortness of breath.      . cholecalciferol (VITAMIN D) 1000 UNITS tablet Take 1 tablet (1,000 Units total) by mouth daily.      . clonazePAM (KLONOPIN) 0.5 MG tablet Take 0.25 mg by mouth daily as needed.   30 tablet    . guaiFENesin-codeine (ROBITUSSIN AC) 100-10 MG/5ML syrup Take 5 mLs by mouth 3 (three) times daily as needed for cough.      Marland Kitchen imipramine (TOFRANIL) 50 MG tablet TAKE 1 TABLET BY MOUTH AT BEDTIME  30 tablet  3  . levofloxacin (LEVAQUIN) 750 MG tablet Take 750 mg by mouth daily.      Marland Kitchen loperamide (IMODIUM) 2 MG capsule Take 2 mg by mouth 4 (four) times daily as needed for diarrhea or loose stools (pt states she takes 8-10 times a day).       Marland Kitchen PARoxetine (PAXIL) 10 MG tablet Take 10 mg by mouth every morning.      . sulfaSALAzine (AZULFIDINE) 500 MG tablet TAKE 2 TABLETS (1,000 MG TOTAL) BY MOUTH 3 (THREE) TIMES DAILY.  180 tablet  0   No current facility-administered medications for this visit.    Allergies as of 01/08/2014 - Review Complete 01/08/2014  Allergen Reaction Noted  . Tetracycline Other (See Comments)     Family History  Problem Relation Age of Onset  . Dementia Mother   . Coronary artery disease Mother   . Asthma Father   . Ovarian cancer Sister   . Colon cancer Neg Hx   . Diabetes Mother   . Diabetes Sister   . Heart disease Sister     x   . Irritable bowel syndrome Sister     History   Social History  . Marital Status: Widowed    Spouse Name: N/A    Number of Children: 2  . Years of Education: N/A   Occupational  History  . Disabled    Social History Main Topics  . Smoking status: Current Every Day Smoker -- 35 years    Types: Cigarettes  . Smokeless tobacco: Never Used     Comment: Widowed form given 03-14-12  . Alcohol Use: No     Comment: socially  . Drug Use: No  . Sexual Activity: Not on file   Other Topics Concern  . Not on file   Social History Narrative   Widowed 08/24/09, 2 grown children. son in [place] and dtr in [place]      Physical Exam: BP 100/70  Pulse 88  Ht 5' 1"  (1.549 m)  Wt 141 lb (63.957 kg)  BMI 26.66 kg/m2 Constitutional: generally well-appearing Psychiatric: alert and oriented x3 Abdomen: soft, nontender, nondistended, no obvious ascites, no peritoneal signs, normal bowel sounds     Assessment and plan: 58 y.o. female with microscopic colitis  Her symptoms are better but not completely resolved on 3 g of mesalamine daily. I'm going to increase her dosage to 4.8 g daily. This will be with asacol 812m pills.  She will return to see me in one year and sooner if needed. She is to call she has any serious problems prior to then

## 2014-01-08 NOTE — Patient Instructions (Addendum)
You have been given a separate informational sheet regarding your tobacco use, the importance of quitting and local resources to help you quit. We have sent the following medications to your pharmacy for you to pick up at your convenience: Asacol Follow up in 1 year.

## 2014-01-09 ENCOUNTER — Ambulatory Visit (INDEPENDENT_AMBULATORY_CARE_PROVIDER_SITE_OTHER)
Admission: RE | Admit: 2014-01-09 | Discharge: 2014-01-09 | Disposition: A | Discharge status: Home / Self Care | Payer: Medicare Other | Source: Ambulatory Visit | Attending: Internal Medicine | Admitting: Internal Medicine

## 2014-01-09 ENCOUNTER — Encounter: Payer: Self-pay | Admitting: Internal Medicine

## 2014-01-09 ENCOUNTER — Ambulatory Visit (INDEPENDENT_AMBULATORY_CARE_PROVIDER_SITE_OTHER): Payer: Medicare Other | Admitting: Internal Medicine

## 2014-01-09 ENCOUNTER — Telehealth: Payer: Self-pay | Admitting: Gastroenterology

## 2014-01-09 VITALS — BP 80/60 | HR 94 | Temp 98.3°F | Resp 12 | Wt 141.0 lb

## 2014-01-09 DIAGNOSIS — R059 Cough, unspecified: Secondary | ICD-10-CM | POA: Diagnosis not present

## 2014-01-09 DIAGNOSIS — R0989 Other specified symptoms and signs involving the circulatory and respiratory systems: Secondary | ICD-10-CM | POA: Diagnosis not present

## 2014-01-09 DIAGNOSIS — J189 Pneumonia, unspecified organism: Secondary | ICD-10-CM

## 2014-01-09 DIAGNOSIS — F17200 Nicotine dependence, unspecified, uncomplicated: Secondary | ICD-10-CM | POA: Diagnosis not present

## 2014-01-09 DIAGNOSIS — R45851 Suicidal ideations: Secondary | ICD-10-CM

## 2014-01-09 DIAGNOSIS — R0689 Other abnormalities of breathing: Secondary | ICD-10-CM

## 2014-01-09 DIAGNOSIS — R05 Cough: Secondary | ICD-10-CM | POA: Diagnosis not present

## 2014-01-09 DIAGNOSIS — F3289 Other specified depressive episodes: Secondary | ICD-10-CM

## 2014-01-09 DIAGNOSIS — F329 Major depressive disorder, single episode, unspecified: Secondary | ICD-10-CM

## 2014-01-09 DIAGNOSIS — F32A Depression, unspecified: Secondary | ICD-10-CM

## 2014-01-09 NOTE — Progress Notes (Signed)
Pre visit review using our clinic review tool, if applicable. No additional management support is needed unless otherwise documented below in the visit note. 

## 2014-01-09 NOTE — Patient Instructions (Signed)
Anoro  one inhalation daily; gargle and spit after use. Lot #: [id number] Exp 7/16 Instructions written on box  Please consider using the E cigarette as we discussed. This could possibly decrease inflammation of the airways   Plain Mucinex (NOT D) for thick secretions ;force NON dairy fluids .   Nasal cleansing in the shower as discussed with lather of mild shampoo.After 10 seconds wash off lather while  exhaling through nostrils. Make sure that all residual soap is removed to prevent irritation.  Flonase OR Nasacort AQ 1 spray in each nostril twice a day as needed. Use the "crossover" technique into opposite nostril spraying toward opposite ear @ 45 degree angle, not straight up into nostril.  Use a Neti pot daily only  as needed for significant sinus congestion; going from open side to congested side . Plain Allegra (NOT D )  160 daily , Loratidine 10 mg , OR Zyrtec 10 mg @ bedtime  as needed for itchy eyes & sneezing.  The Psychiatry referral will be scheduled and you'll be notified of the time.

## 2014-01-09 NOTE — Telephone Encounter (Signed)
Pt had questions regarding her medication changes from most recent OV. Discussed directions with pt per OV. Questions answered.

## 2014-01-09 NOTE — Progress Notes (Signed)
   Subjective:    Patient ID: Monique Swanson, female    DOB: 20-Oct-1955, 58 y.o.   MRN: [id number]  HPI  Patient is seen today for follow-up of pneumonia.  This was diagnosed 12/31/13 at Urgent Care by CXR.  She was given Levaquin.  Since then, her cough and shortness of breath have improved.  Cough is productive of tan mucous.   She is a current 1 PPD smoker.   She is also concerned about her depression.  She states that this has been worsening over the last several weeks.  She is currently taking Paxil and Imipramine.  She was previously on trazadone but this was discontinued.  She is under increased stress with her daughter and grandchildren living with her.  She is sleeping 3-4 hours per night.  She has trouble falling asleep "can't turn brain off".  She has had suicidal thoughts but has no plan.  She has not had homicidal thoughts.  She has never seen a counselor before.    Review of Systems  Constitutional: Negative for fever, chills and unexpected weight change.  HENT: Positive for rhinorrhea (clear). Negative for congestion, ear discharge, ear pain, postnasal drip, sinus pressure, sneezing and sore throat.   Eyes: Negative for itching and visual disturbance.  Respiratory: Positive for cough (improving) and shortness of breath (improving). Negative for apnea, choking, chest tightness and wheezing.   Cardiovascular: Negative for chest pain and palpitations.  Gastrointestinal: Negative.   Musculoskeletal: Negative for arthralgias and myalgias.  Skin: Negative for rash and wound.  Neurological: Positive for dizziness (some orthostatic dizziness). Negative for syncope, weakness, light-headedness, numbness and headaches.  Psychiatric/Behavioral: Positive for sleep disturbance and dysphoric mood.       Objective:   Physical Exam  Vitals reviewed. Constitutional: She is oriented to person, place, and time. She appears well-developed and well-nourished. No distress.  Neck: Normal range of  motion. Neck supple. No thyromegaly present.  Cardiovascular: Normal rate, regular rhythm and normal heart sounds.   No murmur heard. Pulmonary/Chest: Effort normal. No respiratory distress.  RLL rales  Abdominal: Soft. Bowel sounds are normal. She exhibits no distension. There is no tenderness. There is no rebound and no guarding.  Musculoskeletal: Normal range of motion. She exhibits no edema and no tenderness.  Lymphadenopathy:    She has no cervical adenopathy.  Neurological: She is alert and oriented to person, place, and time.  Skin: Skin is warm and dry. No rash noted. She is not diaphoretic. No erythema.  Psychiatric:  Flat affect   Blood pressure recheck 98/62     Assessment & Plan:

## 2014-01-09 NOTE — Progress Notes (Signed)
   Subjective:    Patient ID: Monique Swanson, female    DOB: [birth year], 58 y.o.   MRN: [id number]  HPI   She was seen at urgent care 12/31/13 for bronchitis symptoms. She was diagnosed with community acquired pneumonia & placed on Levaquin. She also was told there was a calcified "spot" on her lung.  Her cough has improved as has the shortness of breath.  She continues to have clear rhinorrhea.  She has albuterol which she's been using on average twice a day  She continues to smoke a pack a day. Her daughter lives with her with 2 grandchildren. The daughter also smokes a pack a day.  She is significantly depressed. She on Paxil and imipramine. She formerly was on trazodone. She has not seen a psychiatrist or counselor. She has been isolating herself and has had some suicidal ideation but no plan.  She been sleeping 3-4 hours per night and  has difficulty going to sleep stating that "I can't turn her brain off".    Review of Systems    She has no upper respiratory tract symptoms. Frontal headache, facial pain , nasal purulence, dental pain, sore throat , otic pain or otic discharge denied. No fever , chills or sweats.     Objective:   Physical Exam   Positive pertinent findings include: Facies are weathered. Chest reveals minor rales at the bases. Nails are deformed & worn. There is no clubbing or cyanosis. Affect is flat  But she is communicative and oriented.  General appearance: adequately nourished; no acute distress or increased work of breathing is present.  No  lymphadenopathy about the head, neck, or axilla noted.  Eyes: No conjunctival inflammation or lid edema is present. There is no scleral icterus. Ears:  External ear exam shows no significant lesions or deformities.  Otoscopic examination reveals clear canals, tympanic membranes are intact bilaterally without bulging, retraction, inflammation or discharge. Nose:  External nasal examination shows no deformity or  inflammation. Nasal mucosa are pink and moist without lesions or exudates. No septal dislocation or deviation.No obstruction to airflow.  Oral exam: Dental hygiene is good; lips and gums are healthy appearing.There is no oropharyngeal erythema or exudate noted.  Neck:  No deformities, thyromegaly, masses, or tenderness noted.   Supple with full range of motion without pain.  Heart:  Normal rate and regular rhythm. S1 and S2 normal without gallop, murmur, click, rub or other extra sounds.  Lungs:No wheezes, rhonchi,or rubs present.No increased work of breathing.   Extremities:  No  edema Skin: Warm & dry w/o jaundice or tenting.         Assessment & Plan:  #1 CAP #2 rales #3 smoker #4 depression Plan: Anero  E cigarette Psych referral

## 2014-01-11 ENCOUNTER — Telehealth: Payer: Self-pay | Admitting: Gastroenterology

## 2014-01-11 MED ORDER — MESALAMINE 1.2 G PO TBEC: 1.2000 g | DELAYED_RELEASE_TABLET | Freq: Two times a day (BID) | ORAL | Status: DC

## 2014-01-11 NOTE — Telephone Encounter (Signed)
I called patient, advised Dr. Ardis Hughs said we can send in [place] in because Asacol was too expensive. I advised patient to call the office back if she has further issues. Patient verbalized understanding.

## 2014-01-11 NOTE — Telephone Encounter (Signed)
Dr. Ardis Hughs,  Patient said Asacol is too expensive. Is there an alternative?  Thank you

## 2014-01-11 NOTE — Telephone Encounter (Signed)
Ok, lets try lialda 1.2gm, take 2 pills twice daily, disp 1 month with 11 refills.

## 2014-01-14 ENCOUNTER — Telehealth: Payer: Self-pay | Admitting: Gastroenterology

## 2014-01-14 NOTE — Telephone Encounter (Signed)
Left message on machine to call back  

## 2014-01-15 MED ORDER — SULFASALAZINE 500 MG PO TABS: 2000.0000 mg | ORAL_TABLET | Freq: Three times a day (TID) | ORAL | Status: DC

## 2014-01-15 NOTE — Telephone Encounter (Signed)
Yes she can, but I'd like her to increase to 563m pills, take 4 pills three times daily; disp one month, 3 refills.  Thanks

## 2014-01-15 NOTE — Telephone Encounter (Signed)
The pt is aware and the prescription has been sent

## 2014-01-15 NOTE — Telephone Encounter (Signed)
The lialda is also to expensive, $400 a month.  She wants to know if she can continue to take sulfasalazine?  It is only $40 a month.

## 2014-01-24 ENCOUNTER — Other Ambulatory Visit: Payer: Self-pay | Admitting: Gastroenterology

## 2014-01-28 ENCOUNTER — Ambulatory Visit (INDEPENDENT_AMBULATORY_CARE_PROVIDER_SITE_OTHER): Payer: Medicare Other | Admitting: Licensed Clinical Social Worker

## 2014-01-28 DIAGNOSIS — F332 Major depressive disorder, recurrent severe without psychotic features: Secondary | ICD-10-CM

## 2014-02-04 ENCOUNTER — Ambulatory Visit (INDEPENDENT_AMBULATORY_CARE_PROVIDER_SITE_OTHER): Payer: Medicare Other | Admitting: Licensed Clinical Social Worker

## 2014-02-04 DIAGNOSIS — F332 Major depressive disorder, recurrent severe without psychotic features: Secondary | ICD-10-CM | POA: Diagnosis not present

## 2014-02-07 ENCOUNTER — Other Ambulatory Visit: Payer: Self-pay | Admitting: Internal Medicine

## 2014-02-18 ENCOUNTER — Ambulatory Visit: Payer: Medicare Other | Admitting: Licensed Clinical Social Worker

## 2014-04-18 ENCOUNTER — Other Ambulatory Visit: Payer: Self-pay | Admitting: Internal Medicine

## 2014-06-06 ENCOUNTER — Other Ambulatory Visit: Payer: Self-pay | Admitting: Gastroenterology

## 2014-06-10 ENCOUNTER — Other Ambulatory Visit: Payer: Self-pay

## 2014-06-10 MED ORDER — SULFASALAZINE 500 MG PO TABS: ORAL_TABLET | ORAL | Status: DC

## 2014-06-12 ENCOUNTER — Other Ambulatory Visit: Payer: Self-pay

## 2014-06-12 MED ORDER — PAROXETINE HCL 40 MG PO TABS: 40.0000 mg | ORAL_TABLET | ORAL | Status: DC

## 2014-07-25 DIAGNOSIS — Z1231 Encounter for screening mammogram for malignant neoplasm of breast: Secondary | ICD-10-CM | POA: Diagnosis not present

## 2014-07-25 DIAGNOSIS — Z803 Family history of malignant neoplasm of breast: Secondary | ICD-10-CM | POA: Diagnosis not present

## 2014-07-25 LAB — HM MAMMOGRAPHY

## 2014-07-28 ENCOUNTER — Ambulatory Visit (INDEPENDENT_AMBULATORY_CARE_PROVIDER_SITE_OTHER): Payer: Medicare Other | Admitting: Family Medicine

## 2014-07-28 ENCOUNTER — Ambulatory Visit (INDEPENDENT_AMBULATORY_CARE_PROVIDER_SITE_OTHER): Payer: Medicare Other

## 2014-07-28 VITALS — BP 110/70 | HR 96 | Temp 97.9°F | Resp 20 | Ht 62.0 in | Wt 135.2 lb

## 2014-07-28 DIAGNOSIS — J9801 Acute bronchospasm: Secondary | ICD-10-CM | POA: Diagnosis not present

## 2014-07-28 DIAGNOSIS — R05 Cough: Secondary | ICD-10-CM

## 2014-07-28 DIAGNOSIS — Z720 Tobacco use: Secondary | ICD-10-CM | POA: Diagnosis not present

## 2014-07-28 DIAGNOSIS — R059 Cough, unspecified: Secondary | ICD-10-CM

## 2014-07-28 DIAGNOSIS — M542 Cervicalgia: Secondary | ICD-10-CM | POA: Diagnosis not present

## 2014-07-28 DIAGNOSIS — S161XXA Strain of muscle, fascia and tendon at neck level, initial encounter: Secondary | ICD-10-CM | POA: Diagnosis not present

## 2014-07-28 DIAGNOSIS — J988 Other specified respiratory disorders: Secondary | ICD-10-CM

## 2014-07-28 DIAGNOSIS — J22 Unspecified acute lower respiratory infection: Secondary | ICD-10-CM

## 2014-07-28 DIAGNOSIS — K5190 Ulcerative colitis, unspecified, without complications: Secondary | ICD-10-CM | POA: Diagnosis not present

## 2014-07-28 LAB — POCT CBC
Granulocyte percent: 55.3 %G (ref 37–80)
HCT, POC: 42 % (ref 37.7–47.9)
Hemoglobin: 13.8 g/dL (ref 12.2–16.2)
Lymph, poc: 1.2 (ref 0.6–3.4)
MCH, POC: 28.6 pg (ref 27–31.2)
MCHC: 32.9 g/dL (ref 31.8–35.4)
MCV: 86.9 fL (ref 80–97)
MID (cbc): 0.4 (ref 0–0.9)
MPV: 8.5 fL (ref 0–99.8)
POC Granulocyte: 1.9 — AB (ref 2–6.9)
POC LYMPH PERCENT: 34.5 %L (ref 10–50)
POC MID %: 10.2 %M (ref 0–12)
Platelet Count, POC: 237 10*3/uL (ref 142–424)
RBC: 4.84 M/uL (ref 4.04–5.48)
RDW, POC: 15.2 %
WBC: 3.5 10*3/uL — AB (ref 4.6–10.2)

## 2014-07-28 MED ORDER — TIZANIDINE HCL 2 MG PO CAPS: 2.0000 mg | ORAL_CAPSULE | Freq: Three times a day (TID) | ORAL | Status: DC | PRN

## 2014-07-28 MED ORDER — AZITHROMYCIN 250 MG PO TABS: ORAL_TABLET | ORAL | Status: DC

## 2014-07-28 MED ORDER — PREDNISONE 20 MG PO TABS: ORAL_TABLET | ORAL | Status: DC

## 2014-07-28 MED ORDER — ALBUTEROL SULFATE HFA 108 (90 BASE) MCG/ACT IN AERS: 2.0000 | INHALATION_SPRAY | Freq: Four times a day (QID) | RESPIRATORY_TRACT | Status: DC | PRN

## 2014-07-28 NOTE — Progress Notes (Addendum)
Subjective:    Patient ID: Monique Swanson, female    DOB: 06/22/1955, 59 y.o.   MRN: [id number]  07/28/2014  Shoulder Pain and Arm Pain  This chart was scribed for Wardell Honour, MD by Stephania Fragmin, ED Scribe. This patient was seen in room 1 and the patient's care was started at 3:32 PM.   HPI HPI Comments: Monique Swanson is a 59 y.o. female who presents to the [facility] complaining of left-sided neck pain that began 4 days ago. She denies any known trauma or injury. Nothing seems to trigger it; it constantly feels painful and the pain is not reproducible. She states she does not feel that the pain is muscular. Patient took 4 Advil this morning, with no relief; however, Advil did alleviate the pain yesterday. She has applied ice with no relief. This is a new problem. She does note a history of osteoporosis. She denies a history of arthritis in her neck.  Exertion does not worsen pain.   Patient does complain of cough productive with white sputum. She had pneumonia 6-7 months ago, for which she has an inhaler. She uses the inhaler prn, but not daily. Patient denies a history of asthma or COPD. She states she is a 1 PPD smoker, and has smoked since she was 59 years old. She denies rhinorrhea, nasal congestion, ear pain, fever, or sore throat.  She suffers with sputum production and frequent nighttime wheezing.  She has not returned to baseline since being diagnosed with pneumonia in 12/2013; treated at that time with Levaquin.    Chart review: Patient saw Dr. Linna Darner at [facility] on 01/09/14, and was diagnosed with CAP and prescribed Anoro, as well as Levaquin.  No previous spirometry in the system.  No previous pulmonology consultation.  Patient is on disability due to ulcerative colitis and depression.  PCP: Gwendolyn Grant, MD     Review of Systems  Constitutional: Negative for fever, chills, diaphoresis and fatigue.  HENT: Negative for congestion, ear pain,  postnasal drip, rhinorrhea, sore throat, trouble swallowing and voice change.   Respiratory: Positive for cough, shortness of breath and wheezing.   Cardiovascular: Negative for chest pain, palpitations and leg swelling.  Gastrointestinal: Negative for nausea, vomiting, abdominal pain and diarrhea.  Musculoskeletal: Positive for myalgias, neck pain and neck stiffness.  Skin: Negative for rash and wound.  Neurological: Positive for weakness. Negative for numbness.    Past Medical History  Diagnosis Date  . Osteoarthritis   . CARCINOMA, SQUAMOUS CELL 2009    s/p rescrtion from R nose bridge  . DEPRESSION   . Headache(784.0)   . Irritable bowel syndrome   . OSTEOPENIA   . Microscopic colitis dx 02/2009    chronic diarrhea  . Osteoporosis, unspecified 09/30/2008    LeB DEXA 05/2012: -2.7, recommended to start bisphos    . Hx of cardiovascular stress test     Lex MV 6/14:  Normal, EF 80%   Past Surgical History  Procedure Laterality Date  . Appendectomy  1969  . Abdominal hysterectomy  1985  . Breast biopsy  1987    left  . Foot surgery  1990    for joint in toes; bilateral  . Tubal ligation    . Bunionectomy Right    Allergies  Allergen Reactions  . Tetracycline Other (See Comments)    Ulcers on tongue        Objective:    BP 110/70 mmHg  Pulse 96  Temp(Src) 97.9 F (36.6 C) (Oral)  Resp 20  Ht 5\' 2"  (1.575 m)  Wt 135 lb 4 oz (61.349 kg)  BMI 24.73 kg/m2  SpO2 90% Physical Exam  Constitutional: She is oriented to person, place, and time. She appears well-developed and well-nourished. No distress.  HENT:  Head: Normocephalic and atraumatic.  Right Ear: Tympanic membrane, external ear and ear canal normal.  Left Ear: Tympanic membrane, external ear and ear canal normal.  Nose: Nose normal.  Mouth/Throat: Oropharynx is clear and moist.  Eyes: Conjunctivae and EOM are normal.  Neck: Normal range of motion. Neck supple. No spinous process tenderness and no  muscular tenderness present. Normal range of motion present. No thyromegaly present.  Pain with all positions of the neck.  Cardiovascular: Normal rate.   Pulmonary/Chest: Effort normal. No respiratory distress. She has no wheezes.  Distant breath sounds throughout.  Musculoskeletal: Normal range of motion. She exhibits no edema.       Right shoulder: Normal. She exhibits normal range of motion, no tenderness, no bony tenderness, no swelling, no pain, no spasm, normal pulse and normal strength.       Left shoulder: Normal. She exhibits normal range of motion, no tenderness, no bony tenderness, no swelling, no pain, no spasm, normal pulse and normal strength.       Cervical back: She exhibits pain. She exhibits normal range of motion, no tenderness, no bony tenderness and no spasm.  5/5 motor strength through all extremities.  Lymphadenopathy:    She has no cervical adenopathy.  Neurological: She is alert and oriented to person, place, and time.  Skin: Skin is warm and dry. She is not diaphoretic.  Psychiatric: She has a normal mood and affect. Her behavior is normal.  Nursing note and vitals reviewed.  Results for orders placed or performed in visit on 07/28/14  POCT CBC  Result Value Ref Range   WBC 3.5 (A) 4.6 - 10.2 K/uL   Lymph, poc 1.2 0.6 - 3.4   POC LYMPH PERCENT 34.5 10 - 50 %L   MID (cbc) 0.4 0 - 0.9   POC MID % 10.2 0 - 12 %M   POC Granulocyte 1.9 (A) 2 - 6.9   Granulocyte percent 55.3 37 - 80 %G   RBC 4.84 4.04 - 5.48 M/uL   Hemoglobin 13.8 12.2 - 16.2 g/dL   HCT, POC 42.0 37.7 - 47.9 %   MCV 86.9 80 - 97 fL   MCH, POC 28.6 27 - 31.2 pg   MCHC 32.9 31.8 - 35.4 g/dL   RDW, POC 15.2 %   Platelet Count, POC 237 142 - 424 K/uL   MPV 8.5 0 - 99.8 fL   SPIROMETRY: NORMAL.    [facility] reading (PRIMARY) by  Dr. Tamala Julian.  CXR: NAD; CERVICAL SPINE FILMS: DDD multilevel mild.      Assessment & Plan:   1. Neck pain on left side   2. Cough   3. Tobacco abuse   4. Ulcerative  colitis, without complications   5. Neck strain, initial encounter   6. Lower respiratory infection   7. Bronchospasm     1. Neck pain/strain: New.  With radicular symptoms; rx for Prednisone and Zanaflex provided. Home exercise program provided. If no improvement in 2-4 weeks, call for referral to ortho. 2.  Lower respiratory infection: persistent; rx for Zpack provided. 3.  Bronchospasm: persistent; hypoxic mildly but no respiratory distress in office; treat with Prednisone; rx for Albuterol to use qid  scheduled for two weeks and then PRN. If no improvement in two to three weeks, call for referral to pulmonology or follow-up with PCP. 4.  Tobacco abuse: chronic condition. 5. Ulcerative colitis: stable; disability.   Meds ordered this encounter  Medications  . predniSONE (DELTASONE) 20 MG tablet    Sig: Two tablets daily x 5 days then one tablet daily x 5 days    Dispense:  15 tablet    Refill:  0  . azithromycin (ZITHROMAX) 250 MG tablet    Sig: Two tablets daily x 1 day then one tablet daily x 4 days    Dispense:  6 tablet    Refill:  0  . tizanidine (ZANAFLEX) 2 MG capsule    Sig: Take 1-2 capsules (2-4 mg total) by mouth 3 (three) times daily as needed for muscle spasms.    Dispense:  40 capsule    Refill:  0  . albuterol (PROVENTIL HFA;VENTOLIN HFA) 108 (90 BASE) MCG/ACT inhaler    Sig: Inhale 2 puffs into the lungs every 6 (six) hours as needed for wheezing or shortness of breath.    Dispense:  1 Inhaler    Refill:  2    No Follow-up on file.    I personally performed the services described in this documentation, which was scribed in my presence. The recorded information has been reviewed and considered.  Lui Bellis Elayne Guerin, M.D. [facility] [address] [place], [place]  [address] [phone] phone [phone] fax

## 2014-07-28 NOTE — Patient Instructions (Signed)
1. Apply heat to neck twice daily 15-20 minutes each time. 2.  Use Albuterol inhaler twice daily for cough for the next two weeks. If no improvement in cough or breathing in two weeks, call for pulmonology referral or follow-up with Dr. Asa Lente.   3.  Perform neck exercises daily for two weeks.  If neck pain is not improved in two weeks, call for orthopedic referral.

## 2014-07-29 ENCOUNTER — Telehealth: Payer: Self-pay

## 2014-07-29 DIAGNOSIS — S161XXA Strain of muscle, fascia and tendon at neck level, initial encounter: Secondary | ICD-10-CM

## 2014-07-29 MED ORDER — CYCLOBENZAPRINE HCL 10 MG PO TABS: 10.0000 mg | ORAL_TABLET | Freq: Three times a day (TID) | ORAL | Status: DC | PRN

## 2014-07-29 NOTE — Telephone Encounter (Signed)
Pt checking on status of call. I told her we were waiting on a response from a doctor. Please advise at [phone]. The mother is in a lot of pain.

## 2014-07-29 NOTE — Telephone Encounter (Signed)
Can we rx pt a cheaper muscle relaxer?

## 2014-07-29 NOTE — Telephone Encounter (Signed)
Pt called again to check on the status of the prescriptions.

## 2014-07-29 NOTE — Telephone Encounter (Signed)
I've sent: Meds ordered this encounter  Medications  . cyclobenzaprine (FLEXERIL) 10 MG tablet    Sig: Take 1 tablet (10 mg total) by mouth 3 (three) times daily as needed for muscle spasms.    Dispense:  30 tablet    Refill:  0    Order Specific Question:  Supervising Provider    Answer:  DOOLITTLE, ROBERT P [[id number]]    Please advise the patient that this can cause drowsiness. She can take it every 8 hours, but when she is working, driving, etc and must be awake and alert, she should reserve it for bedtime.

## 2014-07-29 NOTE — Telephone Encounter (Addendum)
Pt states she was seen by Dr.Smith and given 4 different medicines, however the one for muscle relaxer isn't covered by her insurance and it will cost $100.00. Would like To have something else cheaper called in. Please call [phone]   [facility] [address]

## 2014-07-30 ENCOUNTER — Encounter: Payer: Self-pay | Admitting: Internal Medicine

## 2014-07-30 MED ORDER — TRAMADOL HCL 50 MG PO TABS: 50.0000 mg | ORAL_TABLET | Freq: Four times a day (QID) | ORAL | Status: DC | PRN

## 2014-07-30 NOTE — Telephone Encounter (Signed)
Called rx in. Pt notified of message.

## 2014-07-30 NOTE — Telephone Encounter (Signed)
1. Please call in Tramadol to pharmacy.  2.  Call patient --- I have sent in Tramadol for patient to take for pain.  She should continue Prednisone, Flexeril as well.  3.  I have also placed referral for orthopedic consultation in case pain does not improve in upcoming week.

## 2014-07-30 NOTE — Telephone Encounter (Signed)
Spoke with pt. She has already taken 3 flexeril and she is not feeling any relief. She says that she is still in pain. She stated she is wondering if it is not muscular.

## 2014-07-31 ENCOUNTER — Emergency Department (HOSPITAL_COMMUNITY)
Admission: EM | Admit: 2014-07-31 | Discharge: 2014-07-31 | Disposition: A | Discharge status: Home / Self Care | Payer: Medicare Other | Attending: Emergency Medicine | Admitting: Emergency Medicine

## 2014-07-31 ENCOUNTER — Encounter (HOSPITAL_COMMUNITY): Payer: Self-pay

## 2014-07-31 ENCOUNTER — Emergency Department (HOSPITAL_COMMUNITY): Payer: Medicare Other

## 2014-07-31 DIAGNOSIS — Z720 Tobacco use: Secondary | ICD-10-CM | POA: Insufficient documentation

## 2014-07-31 DIAGNOSIS — M549 Dorsalgia, unspecified: Secondary | ICD-10-CM | POA: Diagnosis not present

## 2014-07-31 DIAGNOSIS — M1990 Unspecified osteoarthritis, unspecified site: Secondary | ICD-10-CM | POA: Diagnosis not present

## 2014-07-31 DIAGNOSIS — R0602 Shortness of breath: Secondary | ICD-10-CM | POA: Diagnosis not present

## 2014-07-31 DIAGNOSIS — Z8719 Personal history of other diseases of the digestive system: Secondary | ICD-10-CM | POA: Diagnosis not present

## 2014-07-31 DIAGNOSIS — Z7952 Long term (current) use of systemic steroids: Secondary | ICD-10-CM | POA: Insufficient documentation

## 2014-07-31 DIAGNOSIS — Z79899 Other long term (current) drug therapy: Secondary | ICD-10-CM | POA: Insufficient documentation

## 2014-07-31 DIAGNOSIS — Z792 Long term (current) use of antibiotics: Secondary | ICD-10-CM | POA: Diagnosis not present

## 2014-07-31 DIAGNOSIS — F329 Major depressive disorder, single episode, unspecified: Secondary | ICD-10-CM | POA: Diagnosis not present

## 2014-07-31 DIAGNOSIS — R0902 Hypoxemia: Secondary | ICD-10-CM | POA: Diagnosis not present

## 2014-07-31 DIAGNOSIS — M546 Pain in thoracic spine: Secondary | ICD-10-CM | POA: Diagnosis not present

## 2014-07-31 DIAGNOSIS — Z85828 Personal history of other malignant neoplasm of skin: Secondary | ICD-10-CM | POA: Insufficient documentation

## 2014-07-31 LAB — BASIC METABOLIC PANEL
Anion gap: 9 (ref 5–15)
BUN: 14 mg/dL (ref 6–23)
CO2: 23 mmol/L (ref 19–32)
Calcium: 9.3 mg/dL (ref 8.4–10.5)
Chloride: 105 mmol/L (ref 96–112)
Creatinine, Ser: 1.14 mg/dL — ABNORMAL HIGH (ref 0.50–1.10)
GFR calc Af Amer: 60 mL/min — ABNORMAL LOW (ref >90)
GFR calc non Af Amer: 52 mL/min — ABNORMAL LOW (ref >90)
Glucose, Bld: 90 mg/dL (ref 70–99)
Potassium: 4.5 mmol/L (ref 3.5–5.1)
Sodium: 137 mmol/L (ref 135–145)

## 2014-07-31 LAB — I-STAT TROPONIN, ED
Troponin i, poc: 0 ng/mL (ref 0.00–0.08)
Troponin i, poc: 0 ng/mL (ref 0.00–0.08)

## 2014-07-31 LAB — CBC
HCT: 37.3 % (ref 36.0–46.0)
Hemoglobin: 11.8 g/dL — ABNORMAL LOW (ref 12.0–15.0)
MCH: 34 pg (ref 26.0–34.0)
MCHC: 31.6 g/dL (ref 30.0–36.0)
MCV: 107.5 fL — ABNORMAL HIGH (ref 78.0–100.0)
Platelets: 494 10*3/uL — ABNORMAL HIGH (ref 150–400)
RBC: 3.47 MIL/uL — ABNORMAL LOW (ref 3.87–5.11)
RDW: 16.8 % — ABNORMAL HIGH (ref 11.5–15.5)
WBC: 9.1 10*3/uL (ref 4.0–10.5)

## 2014-07-31 LAB — POC OCCULT BLOOD, ED: Fecal Occult Bld: NEGATIVE

## 2014-07-31 MED ORDER — HYDROMORPHONE HCL 1 MG/ML IJ SOLN
0.5000 mg | Freq: Once | INTRAMUSCULAR | Status: AC
  Administered 2014-07-31: 0.5 mg via INTRAVENOUS
  Filled 2014-07-31: qty 1

## 2014-07-31 MED ORDER — IPRATROPIUM-ALBUTEROL 0.5-2.5 (3) MG/3ML IN SOLN
3.0000 mL | Freq: Once | RESPIRATORY_TRACT | Status: AC
  Administered 2014-07-31: 3 mL via RESPIRATORY_TRACT
  Filled 2014-07-31: qty 3

## 2014-07-31 MED ORDER — OXYCODONE-ACETAMINOPHEN 5-325 MG PO TABS: 1.0000 | ORAL_TABLET | Freq: Four times a day (QID) | ORAL | Status: DC | PRN

## 2014-07-31 MED ORDER — ALBUTEROL SULFATE (2.5 MG/3ML) 0.083% IN NEBU
5.0000 mg | INHALATION_SOLUTION | Freq: Once | RESPIRATORY_TRACT | Status: AC
  Administered 2014-07-31: 5 mg via RESPIRATORY_TRACT
  Filled 2014-07-31: qty 6

## 2014-07-31 MED ORDER — IOHEXOL 350 MG/ML SOLN
100.0000 mL | Freq: Once | INTRAVENOUS | Status: AC | PRN
  Administered 2014-07-31: 80 mL via INTRAVENOUS

## 2014-07-31 NOTE — ED Provider Notes (Signed)
CSN: [id number]     Arrival date & time 07/31/14  1012 History   First MD Initiated Contact with Patient 07/31/14 1023     Chief Complaint  Patient presents with  . Arm Pain  . Shortness of Breath     (Consider location/radiation/quality/duration/timing/severity/associated sxs/prior Treatment) Patient is a 59 y.o. female presenting with arm pain and shortness of breath. The history is provided by the patient.  Arm Pain Associated symptoms include shortness of breath. Pertinent negatives include no chest pain, no abdominal pain and no headaches.  Shortness of Breath Severity:  Mild Onset quality:  Gradual Duration:  6 days Timing:  Constant Progression:  Unchanged Chronicity:  New Context: URI   Relieved by:  Nothing Worsened by:  Nothing tried Ineffective treatments:  Inhaler (tramadol, abx, steroids) Associated symptoms: cough   Associated symptoms: no abdominal pain, no chest pain, no fever, no headaches, no neck pain and no vomiting   Cough:    Cough characteristics:  Productive   Sputum characteristics:  White   Severity:  Mild   Past Medical History  Diagnosis Date  . Osteoarthritis   . CARCINOMA, SQUAMOUS CELL 2009    s/p rescrtion from R nose bridge  . DEPRESSION   . Headache(784.0)   . Irritable bowel syndrome   . OSTEOPENIA   . Microscopic colitis dx 02/2009    chronic diarrhea  . Osteoporosis, unspecified 09/30/2008    LeB DEXA 05/2012: -2.7, recommended to start bisphos    . Hx of cardiovascular stress test     Lex MV 6/14:  Normal, EF 80%   Past Surgical History  Procedure Laterality Date  . Appendectomy  1969  . Abdominal hysterectomy  1985  . Breast biopsy  1987    left  . Foot surgery  1990    for joint in toes; bilateral  . Tubal ligation    . Bunionectomy Right    Family History  Problem Relation Age of Onset  . Dementia Mother   . Coronary artery disease Mother   . Asthma Father   . Ovarian cancer Sister   . Colon cancer Neg Hx   .  Diabetes Mother   . Diabetes Sister   . Heart disease Sister     x   . Irritable bowel syndrome Sister    History  Substance Use Topics  . Smoking status: Current Every Day Smoker -- 35 years    Types: Cigarettes  . Smokeless tobacco: Never Used     Comment: Widowed form given 03-14-12  . Alcohol Use: No     Comment: socially   OB History    No data available     Review of Systems  Constitutional: Negative for fever and fatigue.  HENT: Negative for congestion and drooling.   Eyes: Negative for pain.  Respiratory: Positive for cough and shortness of breath.   Cardiovascular: Negative for chest pain.  Gastrointestinal: Negative for nausea, vomiting, abdominal pain and diarrhea.  Genitourinary: Negative for dysuria and hematuria.  Musculoskeletal: Positive for back pain. Negative for gait problem and neck pain.  Skin: Negative for color change.  Neurological: Negative for dizziness and headaches.  Hematological: Negative for adenopathy.  Psychiatric/Behavioral: Negative for behavioral problems.  All other systems reviewed and are negative.     Allergies  Tetracycline  Home Medications   Prior to Admission medications   Medication Sig Start Date End Date Taking? Authorizing Provider  albuterol (PROVENTIL HFA;VENTOLIN HFA) 108 (90 BASE) MCG/ACT inhaler Inhale 2  puffs into the lungs every 6 (six) hours as needed for wheezing or shortness of breath. 07/28/14   Wardell Honour, MD  azithromycin (ZITHROMAX) 250 MG tablet Two tablets daily x 1 day then one tablet daily x 4 days 07/28/14   Wardell Honour, MD  cholecalciferol (VITAMIN D) 1000 UNITS tablet Take 1 tablet (1,000 Units total) by mouth daily. 05/18/12   Rowe Clack, MD  cyclobenzaprine (FLEXERIL) 10 MG tablet Take 1 tablet (10 mg total) by mouth 3 (three) times daily as needed for muscle spasms. 07/29/14   Chelle S Jeffery, PA-C  imipramine (TOFRANIL) 50 MG tablet Take 1 tablet (50 mg total) by mouth at bedtime. 04/18/14    Rowe Clack, MD  loperamide (IMODIUM) 2 MG capsule Take 2 mg by mouth 4 (four) times daily as needed for diarrhea or loose stools (pt states she takes 8-10 times a day).     Historical Provider, MD  mesalamine (LIALDA) 1.2 G EC tablet Take 1 tablet (1.2 g total) by mouth 2 (two) times daily. Patient not taking: Reported on 07/28/2014 01/11/14   Milus Banister, MD  PARoxetine (PAXIL) 40 MG tablet Take 1 tablet (40 mg total) by mouth every morning. 06/12/14   Rowe Clack, MD  predniSONE (DELTASONE) 20 MG tablet Two tablets daily x 5 days then one tablet daily x 5 days 07/28/14   Wardell Honour, MD  sulfaSALAzine (AZULFIDINE) 500 MG tablet TAKE 4 TABLETS (2,000 MG TOTAL) BY MOUTH 3 (THREE) TIMES DAILY. 06/10/14   Milus Banister, MD  traMADol (ULTRAM) 50 MG tablet Take 1 tablet (50 mg total) by mouth every 6 (six) hours as needed. 07/30/14   Wardell Honour, MD   There were no vitals taken for this visit. Physical Exam  Constitutional: She is oriented to person, place, and time. She appears well-developed and well-nourished.  HENT:  Head: Normocephalic and atraumatic.  Mouth/Throat: Oropharynx is clear and moist. No oropharyngeal exudate.  Eyes: Conjunctivae and EOM are normal. Pupils are equal, round, and reactive to light.  Neck: Normal range of motion. Neck supple.  Cardiovascular: Normal rate, regular rhythm, normal heart sounds and intact distal pulses.  Exam reveals no gallop and no friction rub.   No murmur heard. Pulmonary/Chest: Effort normal and breath sounds normal. No respiratory distress. She has no wheezes.  Abdominal: Soft. Bowel sounds are normal. There is no tenderness. There is no rebound and no guarding.  Genitourinary:  Normal-appearing external rectum. Normal rectal exam. Brown stool. Hemoccult negative.  Musculoskeletal: Normal range of motion. She exhibits no edema or tenderness.  No focal tenderness of the shoulders or neck.  Neurological: She is alert and  oriented to person, place, and time.  Skin: Skin is warm and dry.  Psychiatric: She has a normal mood and affect. Her behavior is normal.  Nursing note and vitals reviewed.   ED Course  Procedures (including critical care time) Labs Review Labs Reviewed  BASIC METABOLIC PANEL - Abnormal; Notable for the following:    Creatinine, Ser 1.14 (*)    GFR calc non Af Amer 52 (*)    GFR calc Af Amer 60 (*)    All other components within normal limits  CBC - Abnormal; Notable for the following:    RBC 3.47 (*)    Hemoglobin 11.8 (*)    MCV 107.5 (*)    RDW 16.8 (*)    Platelets 494 (*)    All other components within normal  limits  I-STAT TROPOININ, ED    Imaging Review No results found.   EKG Interpretation   Date/Time:  Wednesday July 31 2014 10:33:12 EDT Ventricular Rate:  83 PR Interval:  158 QRS Duration: 82 QT Interval:  399 QTC Calculation: 469 R Axis:   42 Text Interpretation:  Sinus rhythm Low voltage, precordial leads No  significant change since last tracing Confirmed by Kenzie Flakes  MD, Atiyana Welte  ([id number]) on 07/31/2014 11:10:22 AM      MDM   Final diagnoses:  SOB (shortness of breath)  Upper back pain on left side    11:16 AM 59 y.o. female w hx of IBS, smoker who presents with left shoulder pain which began gradually 6 days ago. She notes that it radiates to her left arm and her left lateral neck. She was seen at an urgent care and had noncontributory films of her neck and chest. She was started on prednisone, inhaler, antibiotics, and tramadol. She notes no significant improvement in her symptoms. She has had a cough productive of white sputum. She also notes mild shortness of breath. She is afebrile, O2 sat in the low 90s on my exam. Vital signs otherwise unremarkable. Chest pain only with coughing. We'll plan on screening lab work and pain control as well as CT of the chest.  Hemoglobin found to be 11.8 today. It was 13.8 several days ago when checked at the  urgent care. She denies any blood in her stool and I performed a Hemoccult.  Hemeoccult neg. hemoglobin drop likely related to lab variability.  3:45 PM I interpreted/reviewed the labs and/or imaging which were non-contributory.  CT neg for PE. Low risk for MACE per HEART pathway.  Sx possibly msk in nature as previously thought.  I have discussed the diagnosis/risks/treatment options with the patient and believe the pt to be eligible for discharge home to follow-up with her pcp. We also discussed returning to the ED immediately if new or worsening sx occur. We discussed the sx which are most concerning (e.g., cp, sob, fever) that necessitate immediate return. Medications administered to the patient during their visit and any new prescriptions provided to the patient are listed below.  Medications given during this visit Medications  ipratropium-albuterol (DUONEB) 0.5-2.5 (3) MG/3ML nebulizer solution 3 mL (3 mLs Nebulization Given 07/31/14 1146)  HYDROmorphone (DILAUDID) injection 0.5 mg (0.5 mg Intravenous Given 07/31/14 1146)  iohexol (OMNIPAQUE) 350 MG/ML injection 100 mL (80 mLs Intravenous Contrast Given 07/31/14 1204)  albuterol (PROVENTIL) (2.5 MG/3ML) 0.083% nebulizer solution 5 mg (5 mg Nebulization Given 07/31/14 1332)  HYDROmorphone (DILAUDID) injection 0.5 mg (0.5 mg Intravenous Given 07/31/14 1333)    Discharge Medication List as of 07/31/2014  2:53 PM    START taking these medications   Details  oxyCODONE-acetaminophen (PERCOCET/ROXICET) 5-325 MG per tablet Take 1 tablet by mouth every 6 (six) hours as needed for moderate pain or severe pain., Starting 07/31/2014, Until Discontinued, Print         Pamella Pert, MD 07/31/14 [phone]

## 2014-07-31 NOTE — ED Notes (Signed)
EKG given to Dr. Aline Brochure

## 2014-07-31 NOTE — ED Notes (Signed)
Pt has left shoulder pain x 1 week.  Went to urgent care and given muscle relaxers.  Pt states she had xray. They told her that her oxygen was low at that time.  Pt presents today with sats of 88-90% on ra.  Shortness of breath.  Denies chest pain.

## 2014-08-02 ENCOUNTER — Telehealth: Payer: Self-pay | Admitting: Gastroenterology

## 2014-08-02 NOTE — Telephone Encounter (Signed)
Pt complains of heartburn, with vomiting.  Pain radiates to the back.  Has tried OTC PPI and H2 blockers with no relief.  This is new for her.  She states her upper back and shoulder are causing her pain as well as SOB, she states the ER ruled out cardiac causes and advised her it may be a pulled muscle.  She will call her PCP and try to get in with them.  I also advised her of reflux precautions and she will call back if she gets no relief or her PCP would like her seen here.

## 2014-08-14 ENCOUNTER — Encounter: Payer: Self-pay | Admitting: Internal Medicine

## 2014-08-14 ENCOUNTER — Ambulatory Visit (INDEPENDENT_AMBULATORY_CARE_PROVIDER_SITE_OTHER): Payer: Medicare Other | Admitting: Internal Medicine

## 2014-08-14 VITALS — BP 110/78 | HR 88 | Temp 98.0°F | Ht 62.0 in | Wt 135.2 lb

## 2014-08-14 DIAGNOSIS — M5412 Radiculopathy, cervical region: Secondary | ICD-10-CM

## 2014-08-14 DIAGNOSIS — K2100 Gastro-esophageal reflux disease with esophagitis, without bleeding: Secondary | ICD-10-CM

## 2014-08-14 MED ORDER — RANITIDINE HCL 150 MG PO TABS: 150.0000 mg | ORAL_TABLET | Freq: Two times a day (BID) | ORAL | Status: DC

## 2014-08-14 MED ORDER — GABAPENTIN 100 MG PO CAPS: ORAL_CAPSULE | ORAL | Status: DC

## 2014-08-14 NOTE — Patient Instructions (Addendum)
Use a cervical memory foam pillow to prevent hyperextension or hyperflexion of the cervical spine.Assess response to the gabapentin one every 8 hours as needed. If it is partially beneficial, it can be increased up to a total of 3 pills every 8 hours as needed. This increase of 1 pill each dose  should take place over 72 hours at least.If 300 mg is effective dose ; there is a 300 mg pill.Reflux of gastric acid may be asymptomatic as this may occur mainly during sleep.The triggers for reflux  include stress; the "aspirin family" ; alcohol; peppermint; and caffeine (coffee, tea, cola, and chocolate). The aspirin family would include aspirin and the nonsteroidal agents such as ibuprofen &  Naproxen. Tylenol would not cause reflux. If having symptoms ; food & drink should be avoided for @ least 2 hours before going to bed.   Please consider using the E cigarette as we discussed. This could possibly decrease inflammation of the airways

## 2014-08-14 NOTE — Progress Notes (Signed)
Pre visit review using our clinic review tool, if applicable. No additional management support is needed unless otherwise documented below in the visit note. 

## 2014-08-14 NOTE — Progress Notes (Signed)
Subjective:    Patient ID: Monique Swanson, female    DOB: 1955-06-30, 59 y.o.   MRN: [id number]  HPI The urgent care records 4/17 & the ER visit 07/31/14 were reviewed. She presented to the urgent care with neck pain on the left as well as cough. Cervical spine films revealed no acute process. There was no evidence of significant degenerative disease. Her white count was low at 3500. Hemoglobin was 13.8.  She went to the emergency room on 4/20 because of the shoulder and left upper extremity pain and associated shortness of breath. The pain in this area had begun 07/25/14 without any trigger or injury except possibly "sleeping wrong". She has no history of past injury to the neck or surgery on the neck. The pain was progressive from 4/14 until the ER visit.   CT angiogram revealed no pulmonary emboli. Her hemoglobin was 11.8 ,a drop of 2 points. Rectal exam revealed negative Hemoccult. The drop in hemoglobin was attributed to lab variability. Creatinine was mildly elevated at 1.14 and GFR was 52. Troponin was negative. EKG revealed no acute process.   She has some weakness in the left upper extremity. The pain now involves the neck, shoulder, and the upper left upper extremity to the elbow area.  She does describe some easy bruising but she is on prednisone.   She's had some blurring of vision. She has no other neuromuscular findings. She has no bleeding dyscrasias other than the easy bruising.  Pain continues as a sharp pain which is almost constant.   Review of Systems She describes significant dyspepsia. Epistaxis, hemoptysis, hematuria, melena, or rectal bleeding denied.No unexplained weight loss, dysphagia, or abdominal pain reported. No persistently small caliber stools  There has been  no abnormal bruising or bleeding. There is no difficulty stopping bleeding with injury. Fever, chills, sweats, or unexplained weight loss not present. No significant headaches. Mental status change or  memory loss denied. Diplopia or vision loss absent. Vertigo, near syncope or imbalance denied. There is no numbness or  tingling in extremities.   No loss of control of bladder or bowels. No seizure stigmata.    Objective:   Physical Exam Pertinent or positive findings include: She describes pain with left lateral rotation of the neck which decreases rotation in that direction.  She has a distant S4.  She has minimal homogenous expiratory rhonchi in all lung fields.  Pedal pulses are decreased, especially dorsalis pedis pulses. She has slight crepitus in the left knee.  She has some fine tremor her hands.  She is quiet and withdrawn.  General appearance :adequately nourished; in no distress. Eyes: No conjunctival inflammation or scleral icterus is present. Oral exam:  Lips and gums are healthy appearing.There is no oropharyngeal erythema or exudate noted. Dental hygiene is good. Heart:  Normal rate and regular rhythm. S1 and S2 normal without gallop, murmur, click,or rub.   Abdomen: bowel sounds normal, soft and non-tender without masses, organomegaly or hernias noted.  No guarding or rebound.  Vascular : all pulses equal ; no bruits present. Skin:Warm & dry.  Intact without suspicious lesions or rashes ; no tenting or jaundice  Lymphatic: No lymphadenopathy is noted about the head, neck, axilla Neurologic exam : Cn 2-7 intact Strength equal & normal in upper & lower extremities Able to walk on heels and toes.   Balance normal  Romberg normal, finger to nose negative       Assessment & Plan:  #1 cervical radiculopathy w/o neuromuscular  deficit #2 reflux Plan: See orders and recommendations

## 2014-08-18 ENCOUNTER — Other Ambulatory Visit: Payer: Self-pay | Admitting: Internal Medicine

## 2014-08-25 ENCOUNTER — Ambulatory Visit
Admission: RE | Admit: 2014-08-25 | Discharge: 2014-08-25 | Disposition: A | Discharge status: Home / Self Care | Payer: Medicare Other | Source: Ambulatory Visit | Attending: Internal Medicine | Admitting: Internal Medicine

## 2014-08-25 DIAGNOSIS — M5412 Radiculopathy, cervical region: Secondary | ICD-10-CM

## 2014-08-25 DIAGNOSIS — M47812 Spondylosis without myelopathy or radiculopathy, cervical region: Secondary | ICD-10-CM | POA: Diagnosis not present

## 2014-08-25 DIAGNOSIS — M5022 Other cervical disc displacement, mid-cervical region: Secondary | ICD-10-CM | POA: Diagnosis not present

## 2014-08-26 ENCOUNTER — Other Ambulatory Visit: Payer: Self-pay | Admitting: Internal Medicine

## 2014-08-26 DIAGNOSIS — G542 Cervical root disorders, not elsewhere classified: Secondary | ICD-10-CM

## 2014-08-27 ENCOUNTER — Telehealth: Payer: Self-pay | Admitting: Internal Medicine

## 2014-08-27 NOTE — Telephone Encounter (Signed)
Called and advised patient that referral has been sent to neuro sx for pinched nerves, that office will be calling patient

## 2014-08-27 NOTE — Telephone Encounter (Signed)
Please call patient regarding her MRI results

## 2014-09-04 DIAGNOSIS — M5022 Other cervical disc displacement, mid-cervical region: Secondary | ICD-10-CM | POA: Diagnosis not present

## 2014-09-07 ENCOUNTER — Other Ambulatory Visit: Payer: Self-pay | Admitting: Internal Medicine

## 2014-09-16 DIAGNOSIS — M5022 Other cervical disc displacement, mid-cervical region: Secondary | ICD-10-CM | POA: Diagnosis not present

## 2014-09-16 HISTORY — PX: BACK SURGERY: SHX140

## 2014-09-23 ENCOUNTER — Ambulatory Visit: Payer: Medicare Other | Admitting: Internal Medicine

## 2014-10-09 DIAGNOSIS — M5022 Other cervical disc displacement, mid-cervical region: Secondary | ICD-10-CM | POA: Diagnosis not present

## 2014-12-09 ENCOUNTER — Encounter: Payer: Self-pay | Admitting: Gastroenterology

## 2014-12-24 ENCOUNTER — Other Ambulatory Visit: Payer: Self-pay

## 2014-12-24 MED ORDER — SULFASALAZINE 500 MG PO TABS: ORAL_TABLET | ORAL | Status: DC

## 2014-12-27 ENCOUNTER — Other Ambulatory Visit: Payer: Self-pay | Admitting: Emergency Medicine

## 2014-12-27 DIAGNOSIS — K2100 Gastro-esophageal reflux disease with esophagitis, without bleeding: Secondary | ICD-10-CM

## 2014-12-27 MED ORDER — RANITIDINE HCL 150 MG PO TABS: 150.0000 mg | ORAL_TABLET | Freq: Two times a day (BID) | ORAL | Status: DC

## 2015-01-13 ENCOUNTER — Other Ambulatory Visit: Payer: Self-pay | Admitting: Internal Medicine

## 2015-01-14 ENCOUNTER — Telehealth: Payer: Self-pay | Admitting: *Deleted

## 2015-01-14 MED ORDER — PAROXETINE HCL 40 MG PO TABS
40.0000 mg | ORAL_TABLET | ORAL | Status: DC
Start: 2015-01-14 — End: 2015-04-14

## 2015-01-14 NOTE — Telephone Encounter (Signed)
Receive call pt states she is needing refill on her paroxetine. Verified pharmacy inform will send to [facility].../lmb

## 2015-02-11 ENCOUNTER — Other Ambulatory Visit: Payer: Self-pay | Admitting: Internal Medicine

## 2015-02-13 ENCOUNTER — Encounter: Payer: Self-pay | Admitting: Gastroenterology

## 2015-02-13 ENCOUNTER — Other Ambulatory Visit (INDEPENDENT_AMBULATORY_CARE_PROVIDER_SITE_OTHER): Payer: Medicare Other

## 2015-02-13 ENCOUNTER — Ambulatory Visit (INDEPENDENT_AMBULATORY_CARE_PROVIDER_SITE_OTHER): Payer: Medicare Other | Admitting: Gastroenterology

## 2015-02-13 VITALS — BP 100/60 | HR 68 | Ht 62.0 in | Wt 135.0 lb

## 2015-02-13 DIAGNOSIS — K219 Gastro-esophageal reflux disease without esophagitis: Secondary | ICD-10-CM | POA: Diagnosis not present

## 2015-02-13 DIAGNOSIS — K52832 Lymphocytic colitis: Secondary | ICD-10-CM

## 2015-02-13 LAB — CBC WITH DIFFERENTIAL/PLATELET
Basophils Absolute: 0.1 10*3/uL (ref 0.0–0.1)
Basophils Relative: 0.8 % (ref 0.0–3.0)
Eosinophils Absolute: 0.1 10*3/uL (ref 0.0–0.7)
Eosinophils Relative: 1.6 % (ref 0.0–5.0)
HCT: 41.2 % (ref 36.0–46.0)
Hemoglobin: 13.4 g/dL (ref 12.0–15.0)
Lymphocytes Relative: 41.2 % (ref 12.0–46.0)
Lymphs Abs: 3.3 10*3/uL (ref 0.7–4.0)
MCHC: 32.6 g/dL (ref 30.0–36.0)
MCV: 92.1 fl (ref 78.0–100.0)
Monocytes Absolute: 0.7 10*3/uL (ref 0.1–1.0)
Monocytes Relative: 9.4 % (ref 3.0–12.0)
Neutro Abs: 3.7 10*3/uL (ref 1.4–7.7)
Neutrophils Relative %: 47 % (ref 43.0–77.0)
Platelets: 410 10*3/uL — ABNORMAL HIGH (ref 150.0–400.0)
RBC: 4.48 Mil/uL (ref 3.87–5.11)
RDW: 15.4 % (ref 11.5–15.5)
WBC: 7.9 10*3/uL (ref 4.0–10.5)

## 2015-02-13 LAB — BASIC METABOLIC PANEL
BUN: 11 mg/dL (ref 6–23)
CO2: 28 mEq/L (ref 19–32)
Calcium: 9.5 mg/dL (ref 8.4–10.5)
Chloride: 102 mEq/L (ref 96–112)
Creatinine, Ser: 0.86 mg/dL (ref 0.40–1.20)
GFR: 71.7 mL/min (ref >60.00)
Glucose, Bld: 101 mg/dL — ABNORMAL HIGH (ref 70–99)
Potassium: 4.1 mEq/L (ref 3.5–5.1)
Sodium: 138 mEq/L (ref 135–145)

## 2015-02-13 MED ORDER — FOLIC ACID 1 MG PO TABS: 1.0000 mg | ORAL_TABLET | Freq: Every day | ORAL | Status: DC

## 2015-02-13 NOTE — Addendum Note (Signed)
Addended by: Marzella Schlein on: 02/13/2015 02:49 PM   Modules accepted: Orders

## 2015-02-13 NOTE — Progress Notes (Signed)
Review of gastrointestinal problems:  1. Lymphocytic, microscopic colitis. Diarrhea for 10 years, eventually colonoscopy December, 2010 by Dr. Ardis Hughs. Essentially normal macroscopically however biopsies showed lymphocytic colitis. Budesonide was started With good improvement, added Imodium As well. February, 2011: taking two Entocort pills a day, for Imodium pills a day with great results. April, 2011 Off Entocort, takes 4-6 Imodium a day with great results. Seen by Nicoletta Ba in 2011, 2013. Variety of antidiarrheal agents tried. 2014 ROV, unclear if steroids ever helped per patient, trial of colestipol started. Recommended repeat colonoscopy given difficulty controlling her symptoms however cardiac issues intervened. Repeat colonoscopy 11/2012 Ardis Hughs, slightly erythematous mucosa in left colon; biopsies confirmed microscopic colitis again; started mesalamine orally. 1 g 3 times a day helped significantly but she still had intermittent flares.   HPI: This is a    very pleasant 59 year old woman whom I last saw about a year ago  Diarrhea is completely gone.  BMs every other day, solid formed stool.  Non bloody.  Had surgery on her back, problems swallowing for 2 weeks and diarrhea returned.  Chief complaint is microscopic colitis, also intermittent GERD  Pyrosis, waterbrash 2-3 times per week.  No dysphagia, no weight loss the heartburn has only occurred at night. She has had no daytime symptoms she only takes ranitidine on an as-needed basis.  She does not drink a lot of caffeine. She does admit to having some late night meals occasionally.     Past Medical History  Diagnosis Date  . Osteoarthritis   . CARCINOMA, SQUAMOUS CELL 2009    s/p rescrtion from R nose bridge  . DEPRESSION   . Headache(784.0)   . Irritable bowel syndrome   . OSTEOPENIA   . Microscopic colitis dx 02/2009    chronic diarrhea  . Osteoporosis, unspecified 09/30/2008    LeB DEXA 05/2012: -2.7, recommended to start  bisphos    . Hx of cardiovascular stress test     Lex MV 6/14:  Normal, EF 80%    Past Surgical History  Procedure Laterality Date  . Appendectomy  1969  . Abdominal hysterectomy  1985  . Breast biopsy  1987    left  . Foot surgery  1990    for joint in toes; bilateral  . Tubal ligation    . Bunionectomy Right     Current Outpatient Prescriptions  Medication Sig Dispense Refill  . albuterol (PROVENTIL HFA;VENTOLIN HFA) 108 (90 BASE) MCG/ACT inhaler Inhale 2 puffs into the lungs every 6 (six) hours as needed for wheezing or shortness of breath. 1 Inhaler 2  . cholecalciferol (VITAMIN D) 1000 UNITS tablet Take 1 tablet (1,000 Units total) by mouth daily.    Marland Kitchen imipramine (TOFRANIL) 50 MG tablet Take 1 tablet (50 mg total) by mouth at bedtime. 90 tablet 3  . loperamide (IMODIUM) 2 MG capsule Take 2 mg by mouth 4 (four) times daily as needed for diarrhea or loose stools (pt states she takes 8-10 times a day).     Marland Kitchen PARoxetine (PAXIL) 40 MG tablet Take 1 tablet (40 mg total) by mouth every morning. 90 tablet 0  . ranitidine (ZANTAC) 150 MG tablet Take 1 tablet (150 mg total) by mouth 2 (two) times daily. 180 tablet 1  . sulfaSALAzine (AZULFIDINE) 500 MG tablet TAKE 4 TABLETS (2,000 MG TOTAL) BY MOUTH 3 (THREE) TIMES DAILY. 1080 tablet 3  . traZODone (DESYREL) 100 MG tablet TAKE 1 TABLET BY MOUTH AT BEDTIME 30 tablet 1   No current  facility-administered medications for this visit.    Allergies as of 02/13/2015 - Review Complete 02/13/2015  Allergen Reaction Noted  . Tetracycline Other (See Comments)     Family History  Problem Relation Age of Onset  . Dementia Mother   . Coronary artery disease Mother   . Asthma Father   . Ovarian cancer Sister   . Colon cancer Neg Hx   . Diabetes Mother   . Diabetes Sister   . Heart disease Sister     x   . Irritable bowel syndrome Sister     Social History   Social History  . Marital Status: Widowed    Spouse Name: N/A  . Number of  Children: 2  . Years of Education: N/A   Occupational History  . Disabled    Social History Main Topics  . Smoking status: Current Every Day Smoker -- 35 years    Types: Cigarettes  . Smokeless tobacco: Never Used     Comment: Widowed form given 03-14-12  . Alcohol Use: No     Comment: socially  . Drug Use: No  . Sexual Activity: Not on file   Other Topics Concern  . Not on file   Social History Narrative   Widowed 08/24/09, 2 grown children. son in [place] and dtr in [place]     Physical Exam: BP 100/60 mmHg  Pulse 68  Ht 5' 2"  (1.575 m)  Wt 135 lb (61.236 kg)  BMI 24.69 kg/m2 Constitutional: generally well-appearing Psychiatric: alert and oriented x3 Abdomen: soft, nontender, nondistended, no obvious ascites, no peritoneal signs, normal bowel sounds   Assessment and plan: 59 y.o. female with microscopicolitis, intermittent GERD   she will continue taking sulfasalazine 12 pills daily for her microscopic colitis. She will supplement for acid 1 mg daily. She will get a basic set of labs including CBC and basic metabolic profile. She has intermittent overnight GERD, possibly associated with late night meals. She is going to try to take H2 blocker on those evenings when she has had a late night meal to see if that helps teh occurrence of her symptoms. She has no alarm symptoms  Owens Loffler, MD [facility] 02/13/2015, 2:22 PM

## 2015-02-13 NOTE — Patient Instructions (Addendum)
One of your biggest health concerns is your smoking.  This increases your risk for most cancers and serious cardiovascular diseases such as strokes, heart attacks.  You should try your best to stop.  If you need assistance, please contact your PCP or Smoking Cessation Class at [facility] [phone]) or [place] (1-800-QUIT-NOW). Continue sulfalazine daily (4 pills tid, will refill when needed). Start folate 78m daily (new script called in). You will have labs checked today in the basement lab.  Please head down after you check out with the front desk  (cbc, bmet). Please return to see Dr. JArdis Hughsin 12 months, sooner if needed. Change ranitidine to bedtime dosing on nights you have had a late meal.

## 2015-02-17 ENCOUNTER — Ambulatory Visit: Payer: Medicare Other | Admitting: Gastroenterology

## 2015-02-24 ENCOUNTER — Ambulatory Visit (INDEPENDENT_AMBULATORY_CARE_PROVIDER_SITE_OTHER): Payer: Medicare Other | Admitting: Internal Medicine

## 2015-02-24 ENCOUNTER — Other Ambulatory Visit (INDEPENDENT_AMBULATORY_CARE_PROVIDER_SITE_OTHER): Payer: Medicare Other

## 2015-02-24 ENCOUNTER — Encounter: Payer: Self-pay | Admitting: Internal Medicine

## 2015-02-24 VITALS — BP 100/60 | HR 91 | Temp 98.5°F | Resp 16 | Ht 62.0 in | Wt 136.0 lb

## 2015-02-24 DIAGNOSIS — E789 Disorder of lipoprotein metabolism, unspecified: Secondary | ICD-10-CM

## 2015-02-24 DIAGNOSIS — Z0000 Encounter for general adult medical examination without abnormal findings: Secondary | ICD-10-CM

## 2015-02-24 DIAGNOSIS — R7301 Impaired fasting glucose: Secondary | ICD-10-CM

## 2015-02-24 LAB — LIPID PANEL
Cholesterol: 205 mg/dL — ABNORMAL HIGH (ref 0–200)
HDL: 45.8 mg/dL (ref >39.00)
LDL Cholesterol: 132 mg/dL — ABNORMAL HIGH (ref 0–99)
NonHDL: 159.07
Total CHOL/HDL Ratio: 4
Triglycerides: 135 mg/dL (ref 0.0–149.0)
VLDL: 27 mg/dL (ref 0.0–40.0)

## 2015-02-24 LAB — HEMOGLOBIN A1C: Hgb A1c MFr Bld: 4.2 % — ABNORMAL LOW (ref 4.6–6.5)

## 2015-02-24 MED ORDER — BUPROPION HCL ER (XL) 150 MG PO TB24: 150.0000 mg | ORAL_TABLET | Freq: Every day | ORAL | Status: DC

## 2015-02-24 MED ORDER — DICLOFENAC SODIUM 75 MG PO TBEC: 75.0000 mg | DELAYED_RELEASE_TABLET | Freq: Two times a day (BID) | ORAL | Status: DC

## 2015-02-24 NOTE — Progress Notes (Signed)
   Subjective:    Patient ID: Monique Swanson, female    DOB: 10-Apr-1956, 59 y.o.   MRN: [id number]  HPI Here for medicare wellness, no new complaints. Please see A/P for status and treatment of chronic medical problems.   Diet: heart healthy Physical activity: sedentary Depression/mood screen: negative Hearing: intact to whispered voice Visual acuity: grossly normal, performs annual eye exam  ADLs: capable Fall risk: none Home safety: good Cognitive evaluation: intact to orientation, naming, recall and repetition EOL planning: adv directives discussed, not in place  I have personally reviewed and have noted 1. The patient's medical and social history - reviewed today no changes 2. Their use of alcohol, tobacco or illicit drugs - smoker 3. Their current medications and supplements 4. The patient's functional ability including ADL's, fall risks, home safety risks and hearing or visual impairment. 5. Diet and physical activities 6. Evidence for depression or mood disorders 7. Care team reviewed and updated (available in snapshot)  Review of Systems  Constitutional: Negative for fever, activity change, appetite change, fatigue and unexpected weight change.  HENT: Negative.   Eyes: Negative.   Respiratory: Negative for cough, chest tightness, shortness of breath and wheezing.   Cardiovascular: Negative for chest pain, palpitations and leg swelling.  Gastrointestinal: Negative for nausea, abdominal pain, diarrhea, constipation and abdominal distention.  Musculoskeletal: Positive for back pain and arthralgias. Negative for myalgias and gait problem.  Skin: Negative.   Neurological: Negative.   Psychiatric/Behavioral: Positive for dysphoric mood and decreased concentration. Negative for behavioral problems. The patient is not nervous/anxious.       Objective:   Physical Exam  Constitutional: She is oriented to person, place, and time. She appears well-developed and well-nourished.    HENT:  Head: Normocephalic and atraumatic.  Eyes: EOM are normal.  Neck: Normal range of motion.  Cardiovascular: Normal rate and regular rhythm.   Pulmonary/Chest: Effort normal and breath sounds normal. No respiratory distress. She has no wheezes. She has no rales.  Abdominal: Soft. She exhibits no distension. There is no tenderness. There is no rebound.  Musculoskeletal: She exhibits no edema.  Neurological: She is alert and oriented to person, place, and time. Coordination normal.  Skin: Skin is warm and dry.   Filed Vitals:   02/24/15 1436  BP: 100/60  Pulse: 91  Temp: 98.5 F (36.9 C)  TempSrc: Oral  Resp: 16  Height: '5\' 2"'$  (1.575 m)  Weight: 136 lb (61.689 kg)  SpO2: 93%      Assessment & Plan:

## 2015-02-24 NOTE — Patient Instructions (Addendum)
We have added wellbutrin (bupropion) for the depression. It may help decrease cravings for cigarettes as well so this may help you to quit smoking if you want to try that.   We have sent in voltaren for the low back. You can take it up to 2 times per day for pain. I have also put some stretching exercises below for you to work on at home to strengthen the muscles and reduce pain.   Health Maintenance, Female Adopting a healthy lifestyle and getting preventive care can go a long way to promote health and wellness. Talk with your health care provider about what schedule of regular examinations is right for you. This is a good chance for you to check in with your provider about disease prevention and staying healthy. In between checkups, there are plenty of things you can do on your own. Experts have done a lot of research about which lifestyle changes and preventive measures are most likely to keep you healthy. Ask your health care provider for more information. WEIGHT AND DIET  Eat a healthy diet  Be sure to include plenty of vegetables, fruits, low-fat dairy products, and lean protein.  Do not eat a lot of foods high in solid fats, added sugars, or salt.  Get regular exercise. This is one of the most important things you can do for your health.  Most adults should exercise for at least 150 minutes each week. The exercise should increase your heart rate and make you sweat (moderate-intensity exercise).  Most adults should also do strengthening exercises at least twice a week. This is in addition to the moderate-intensity exercise.  Maintain a healthy weight  Body mass index (BMI) is a measurement that can be used to identify possible weight problems. It estimates body fat based on height and weight. Your health care provider can help determine your BMI and help you achieve or maintain a healthy weight.  For females 23 years of age and older:   A BMI below 18.5 is considered  underweight.  A BMI of 18.5 to 24.9 is normal.  A BMI of 25 to 29.9 is considered overweight.  A BMI of 30 and above is considered obese.  Watch levels of cholesterol and blood lipids  You should start having your blood tested for lipids and cholesterol at 59 years of age, then have this test every 5 years.  You may need to have your cholesterol levels checked more often if:  Your lipid or cholesterol levels are high.  You are older than 59 years of age.  You are at high risk for heart disease.  CANCER SCREENING   Lung Cancer  Lung cancer screening is recommended for adults 55-65 years old who are at high risk for lung cancer because of a history of smoking.  A yearly low-dose CT scan of the lungs is recommended for people who:  Currently smoke.  Have quit within the past 15 years.  Have at least a 30-pack-year history of smoking. A pack year is smoking an average of one pack of cigarettes a day for 1 year.  Yearly screening should continue until it has been 15 years since you quit.  Yearly screening should stop if you develop a health problem that would prevent you from having lung cancer treatment.  Breast Cancer  Practice breast self-awareness. This means understanding how your breasts normally appear and feel.  It also means doing regular breast self-exams. Let your health care provider know about any changes, no  matter how small.  If you are in your 20s or 30s, you should have a clinical breast exam (CBE) by a health care provider every 1-3 years as part of a regular health exam.  If you are 31 or older, have a CBE every year. Also consider having a breast X-ray (mammogram) every year.  If you have a family history of breast cancer, talk to your health care provider about genetic screening.  If you are at high risk for breast cancer, talk to your health care provider about having an MRI and a mammogram every year.  Breast cancer gene (BRCA) assessment is  recommended for women who have family members with BRCA-related cancers. BRCA-related cancers include:  Breast.  Ovarian.  Tubal.  Peritoneal cancers.  Results of the assessment will determine the need for genetic counseling and BRCA1 and BRCA2 testing. Cervical Cancer Your health care provider may recommend that you be screened regularly for cancer of the pelvic organs (ovaries, uterus, and vagina). This screening involves a pelvic examination, including checking for microscopic changes to the surface of your cervix (Pap test). You may be encouraged to have this screening done every 3 years, beginning at age 38.  For women ages 9-65, health care providers may recommend pelvic exams and Pap testing every 3 years, or they may recommend the Pap and pelvic exam, combined with testing for human papilloma virus (HPV), every 5 years. Some types of HPV increase your risk of cervical cancer. Testing for HPV may also be done on women of any age with unclear Pap test results.  Other health care providers may not recommend any screening for nonpregnant women who are considered low risk for pelvic cancer and who do not have symptoms. Ask your health care provider if a screening pelvic exam is right for you.  If you have had past treatment for cervical cancer or a condition that could lead to cancer, you need Pap tests and screening for cancer for at least 20 years after your treatment. If Pap tests have been discontinued, your risk factors (such as having a new sexual partner) need to be reassessed to determine if screening should resume. Some women have medical problems that increase the chance of getting cervical cancer. In these cases, your health care provider may recommend more frequent screening and Pap tests. Colorectal Cancer  This type of cancer can be detected and often prevented.  Routine colorectal cancer screening usually begins at 59 years of age and continues through 59 years of  age.  Your health care provider may recommend screening at an earlier age if you have risk factors for colon cancer.  Your health care provider may also recommend using home test kits to check for hidden blood in the stool.  A small camera at the end of a tube can be used to examine your colon directly (sigmoidoscopy or colonoscopy). This is done to check for the earliest forms of colorectal cancer.  Routine screening usually begins at age 2.  Direct examination of the colon should be repeated every 5-10 years through 59 years of age. However, you may need to be screened more often if early forms of precancerous polyps or small growths are found. Skin Cancer  Check your skin from head to toe regularly.  Tell your health care provider about any new moles or changes in moles, especially if there is a change in a mole's shape or color.  Also tell your health care provider if you have a mole that  is larger than the size of a pencil eraser.  Always use sunscreen. Apply sunscreen liberally and repeatedly throughout the day.  Protect yourself by wearing long sleeves, pants, a wide-brimmed hat, and sunglasses whenever you are outside. HEART DISEASE, DIABETES, AND HIGH BLOOD PRESSURE   High blood pressure causes heart disease and increases the risk of stroke. High blood pressure is more likely to develop in:  People who have blood pressure in the high end of the normal range (130-139/85-89 mm Hg).  People who are overweight or obese.  People who are African American.  If you are 58-69 years of age, have your blood pressure checked every 3-5 years. If you are 29 years of age or older, have your blood pressure checked every year. You should have your blood pressure measured twice--once when you are at a hospital or clinic, and once when you are not at a hospital or clinic. Record the average of the two measurements. To check your blood pressure when you are not at a hospital or clinic, you can  use:  An automated blood pressure machine at a pharmacy.  A home blood pressure monitor.  If you are between 85 years and 56 years old, ask your health care provider if you should take aspirin to prevent strokes.  Have regular diabetes screenings. This involves taking a blood sample to check your fasting blood sugar level.  If you are at a normal weight and have a low risk for diabetes, have this test once every three years after 59 years of age.  If you are overweight and have a high risk for diabetes, consider being tested at a younger age or more often. PREVENTING INFECTION  Hepatitis B  If you have a higher risk for hepatitis B, you should be screened for this virus. You are considered at high risk for hepatitis B if:  You were born in a country where hepatitis B is common. Ask your health care provider which countries are considered high risk.  Your parents were born in a high-risk country, and you have not been immunized against hepatitis B (hepatitis B vaccine).  You have HIV or AIDS.  You use needles to inject [place] drugs.  You live with someone who has hepatitis B.  You have had sex with someone who has hepatitis B.  You get hemodialysis treatment.  You take certain medicines for conditions, including cancer, organ transplantation, and autoimmune conditions. Hepatitis C  Blood testing is recommended for:  Everyone born from [birth year] through 1965.  Anyone with known risk factors for hepatitis C. Sexually transmitted infections (STIs)  You should be screened for sexually transmitted infections (STIs) including gonorrhea and chlamydia if:  You are sexually active and are younger than 59 years of age.  You are older than 59 years of age and your health care provider tells you that you are at risk for this type of infection.  Your sexual activity has changed since you were last screened and you are at an increased risk for chlamydia or gonorrhea. Ask your health care  provider if you are at risk.  If you do not have HIV, but are at risk, it may be recommended that you take a prescription medicine daily to prevent HIV infection. This is called pre-exposure prophylaxis (PrEP). You are considered at risk if:  You are sexually active and do not regularly use condoms or know the HIV status of your partner(s).  You take drugs by injection.  You are sexually active  with a partner who has HIV. Talk with your health care provider about whether you are at high risk of being infected with HIV. If you choose to begin PrEP, you should first be tested for HIV. You should then be tested every 3 months for as long as you are taking PrEP.  PREGNANCY   If you are premenopausal and you may become pregnant, ask your health care provider about preconception counseling.  If you may become pregnant, take 400 to 800 micrograms (mcg) of folic acid every day.  If you want to prevent pregnancy, talk to your health care provider about birth control (contraception). OSTEOPOROSIS AND MENOPAUSE   Osteoporosis is a disease in which the bones lose minerals and strength with aging. This can result in serious bone fractures. Your risk for osteoporosis can be identified using a bone density scan.  If you are 78 years of age or older, or if you are at risk for osteoporosis and fractures, ask your health care provider if you should be screened.  Ask your health care provider whether you should take a calcium or vitamin D supplement to lower your risk for osteoporosis.  Menopause may have certain physical symptoms and risks.  Hormone replacement therapy may reduce some of these symptoms and risks. Talk to your health care provider about whether hormone replacement therapy is right for you.  HOME CARE INSTRUCTIONS   Schedule regular health, dental, and eye exams.  Stay current with your immunizations.   Do not use any tobacco products including cigarettes, chewing tobacco, or  electronic cigarettes.  If you are pregnant, do not drink alcohol.  If you are breastfeeding, limit how much and how often you drink alcohol.  Limit alcohol intake to no more than 1 drink per day for nonpregnant women. One drink equals 12 ounces of beer, 5 ounces of wine, or 1 ounces of hard liquor.  Do not use [place] drugs.  Do not share needles.  Ask your health care provider for help if you need support or information about quitting drugs.  Tell your health care provider if you often feel depressed.  Tell your health care provider if you have ever been abused or do not feel safe at home.   This information is not intended to replace advice given to you by your health care provider. Make sure you discuss any questions you have with your health care provider.   Document Released: 10/12/2010 Document Revised: 04/19/2014 Document Reviewed: 02/28/2013 Elsevier Interactive Patient Education [employer].

## 2015-02-24 NOTE — Progress Notes (Signed)
Pre visit review using our clinic review tool, if applicable. No additional management support is needed unless otherwise documented below in the visit note. 

## 2015-02-25 ENCOUNTER — Encounter: Payer: Self-pay | Admitting: Internal Medicine

## 2015-02-25 DIAGNOSIS — Z0000 Encounter for general adult medical examination without abnormal findings: Secondary | ICD-10-CM | POA: Insufficient documentation

## 2015-02-25 LAB — HEPATITIS C ANTIBODY: HCV Ab: NEGATIVE

## 2015-02-25 NOTE — Assessment & Plan Note (Signed)
Labs ordered, counseled on the need to stop smoking and added bupropion for her depression which should help with cravings as well. 10 year screening recommendations given at visit. Declines flu shot. Hep c screening today, mammogram and colonoscopy up to date.

## 2015-03-15 ENCOUNTER — Other Ambulatory Visit: Payer: Self-pay | Admitting: Internal Medicine

## 2015-04-14 ENCOUNTER — Other Ambulatory Visit: Payer: Self-pay | Admitting: Internal Medicine

## 2015-04-18 ENCOUNTER — Telehealth: Payer: Self-pay

## 2015-04-18 MED ORDER — BUPROPION HCL ER (XL) 150 MG PO TB24: 150.0000 mg | ORAL_TABLET | Freq: Every day | ORAL | Status: DC

## 2015-04-18 NOTE — Telephone Encounter (Signed)
90 day supply request to [facility] for bupropion

## 2015-04-18 NOTE — Telephone Encounter (Signed)
Done

## 2015-05-15 ENCOUNTER — Other Ambulatory Visit: Payer: Self-pay | Admitting: Internal Medicine

## 2015-06-05 ENCOUNTER — Encounter: Payer: Self-pay | Admitting: Internal Medicine

## 2015-06-13 IMAGING — CR DG CHEST 2V
2 series · 2 of 2 positions shown · non-contrast
Comparison: 08/21/2012.

CLINICAL DATA: Pneumonia.  Cough.

EXAM:
CHEST  2 VIEW

[view not recorded (1 of 2)]
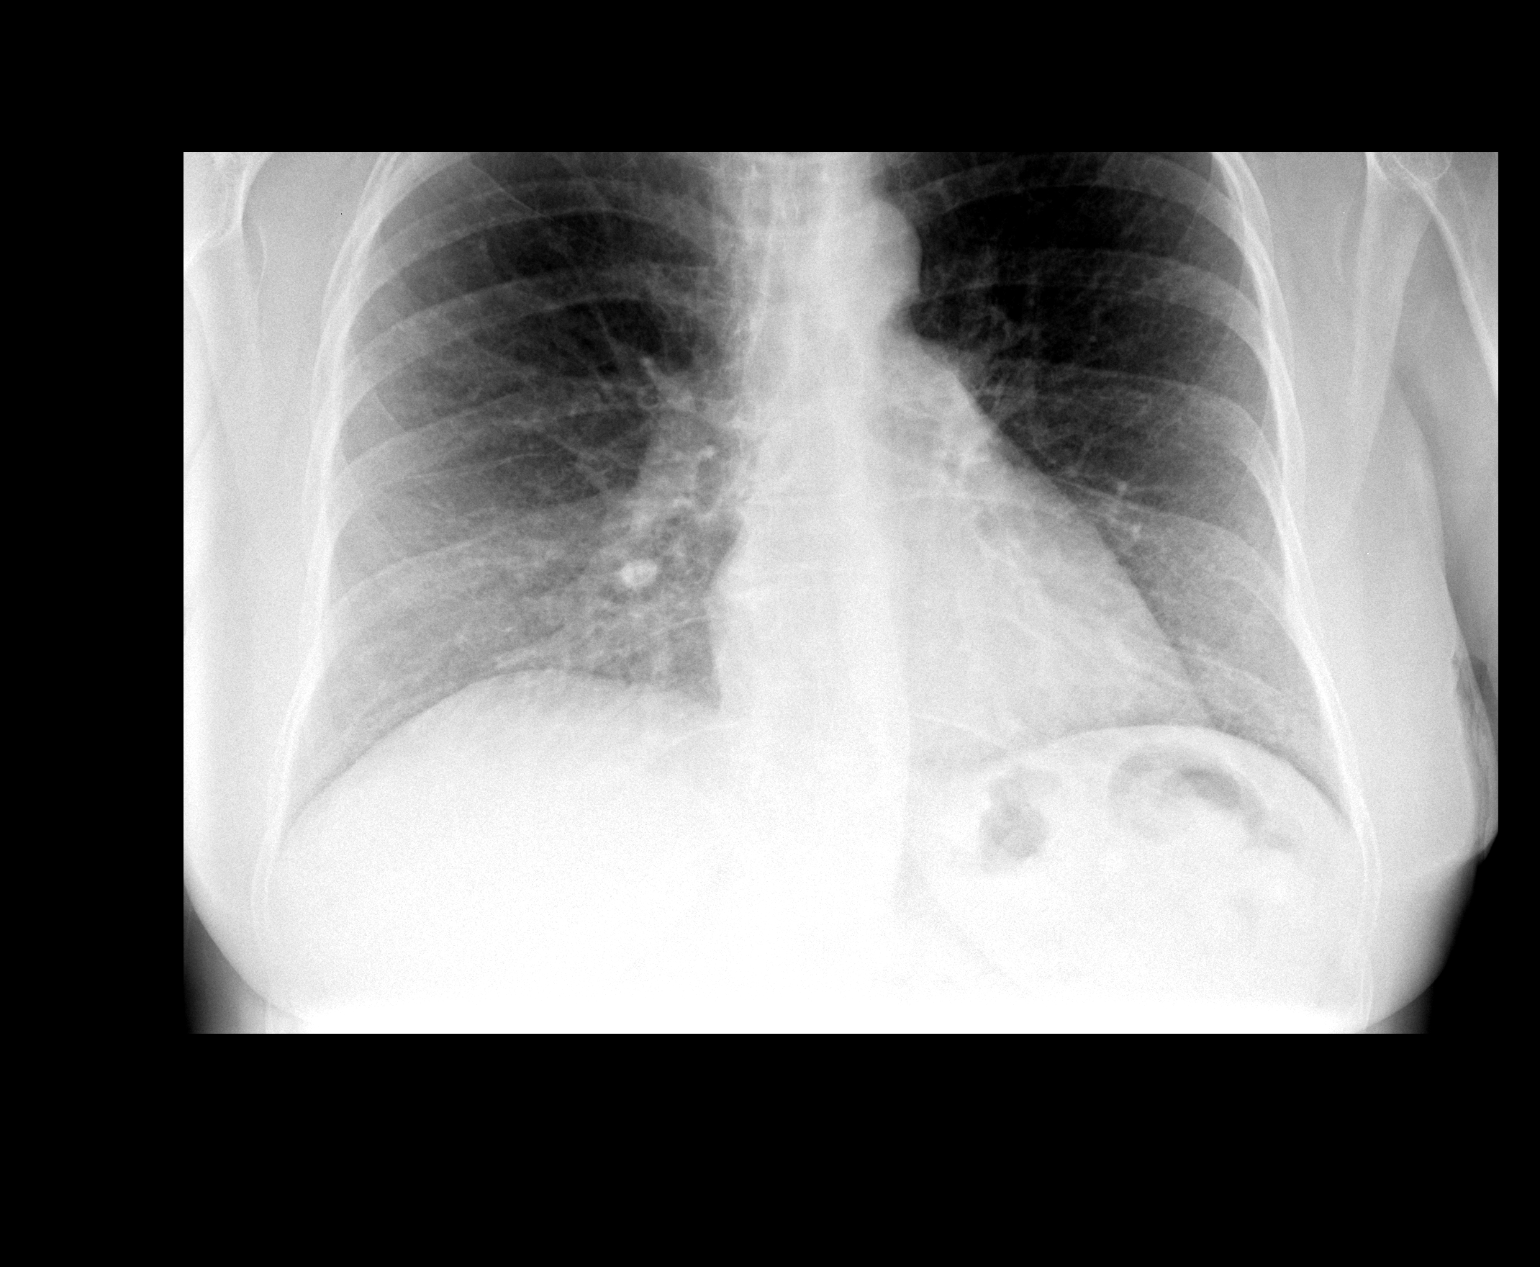

[view not recorded (2 of 2)]
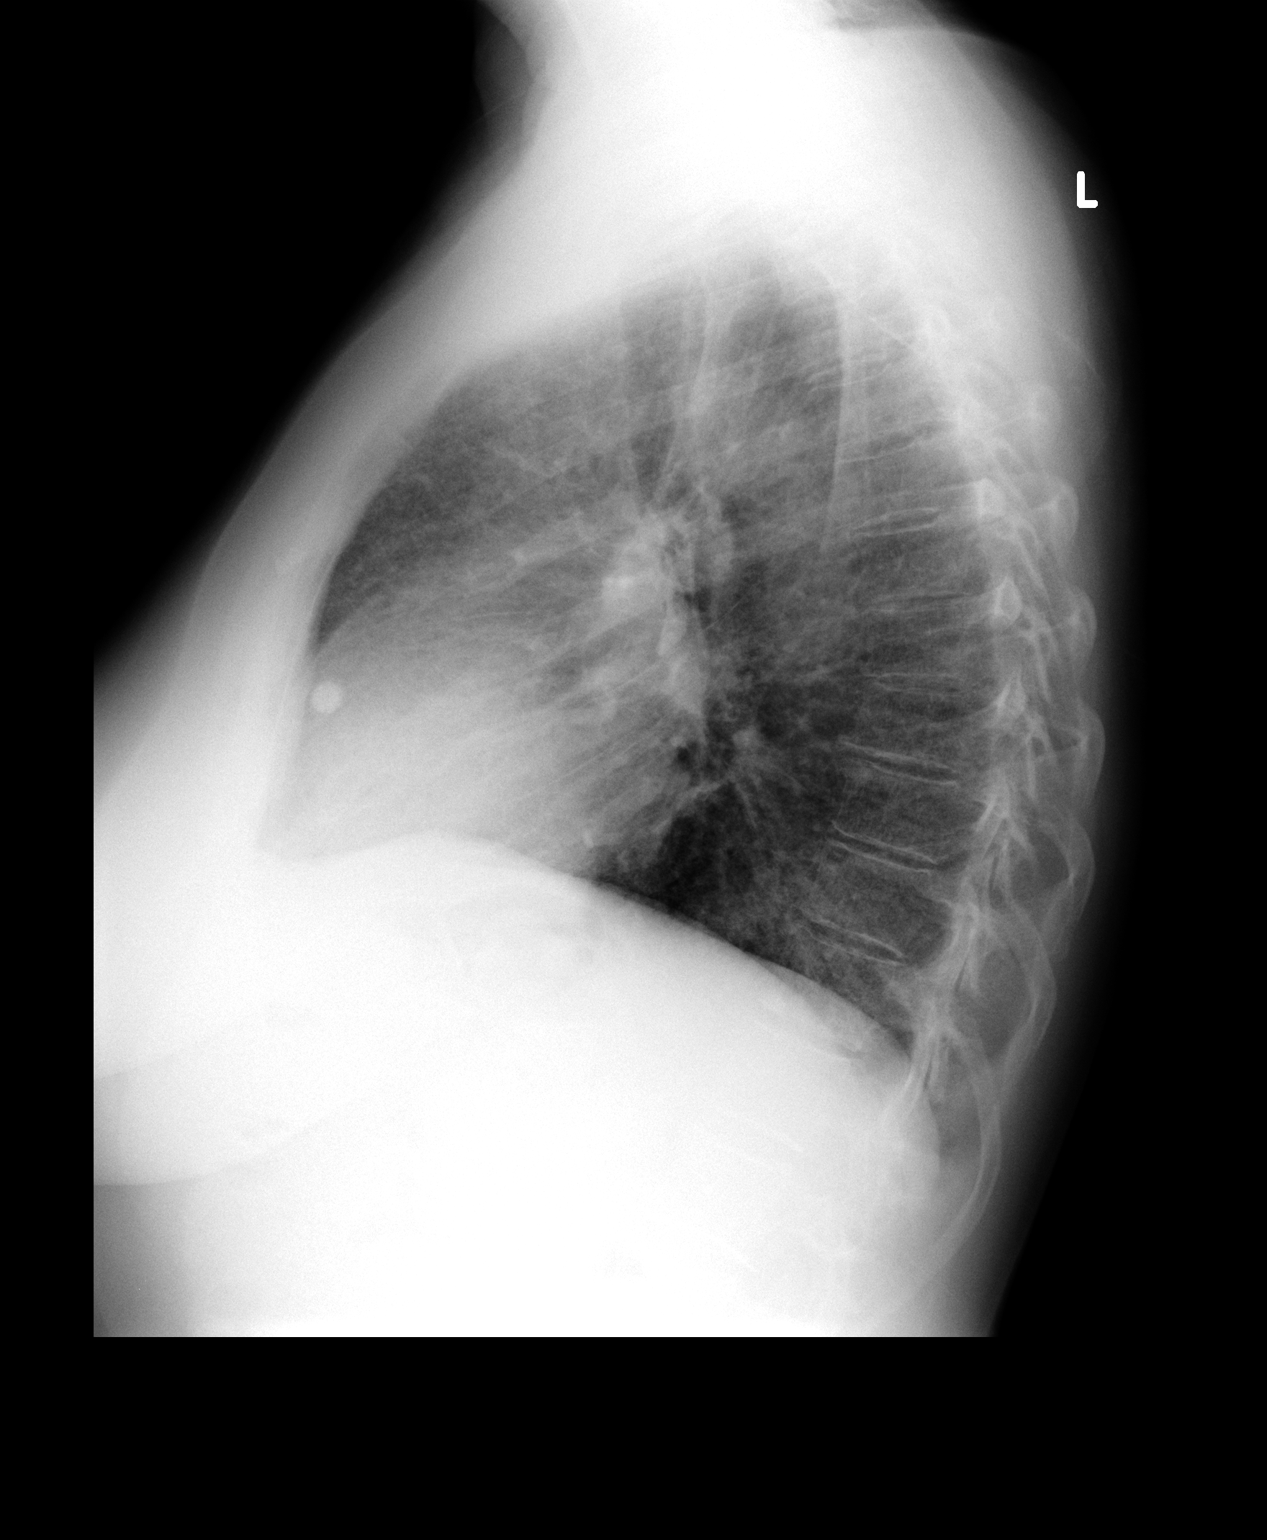

[2 of 2 positions shown; findings below may reference images not displayed]

FINDINGS: Mediastinum hilar structures normal. Mild bilateral pulmonary
interstitial prominence. Active interstitial lung disease including
pneumonitis could present in this fashion. Underlying chronic
interstitial lung disease cannot be excluded. Stable calcified
pulmonary nodule right lung base consistent with granuloma or
hamartoma. Heart size normal. No pleural effusion or pneumothorax.
No acute bony abnormality.
IMPRESSION: Mild diffuse bilateral interstitial prominence. Pneumonitis cannot
be excluded .

## 2015-06-15 ENCOUNTER — Other Ambulatory Visit: Payer: Self-pay | Admitting: Internal Medicine

## 2015-07-19 ENCOUNTER — Other Ambulatory Visit: Payer: Self-pay | Admitting: Internal Medicine

## 2015-08-08 ENCOUNTER — Other Ambulatory Visit: Payer: Self-pay | Admitting: Internal Medicine

## 2015-08-08 ENCOUNTER — Ambulatory Visit (INDEPENDENT_AMBULATORY_CARE_PROVIDER_SITE_OTHER): Payer: Medicare Other | Admitting: Internal Medicine

## 2015-08-08 ENCOUNTER — Encounter: Payer: Self-pay | Admitting: Internal Medicine

## 2015-08-08 VITALS — BP 108/72 | HR 103 | Temp 98.9°F | Resp 14 | Ht 62.0 in | Wt 132.8 lb

## 2015-08-08 DIAGNOSIS — F330 Major depressive disorder, recurrent, mild: Secondary | ICD-10-CM | POA: Diagnosis not present

## 2015-08-08 MED ORDER — ESCITALOPRAM OXALATE 10 MG PO TABS: 10.0000 mg | ORAL_TABLET | Freq: Every day | ORAL | Status: DC

## 2015-08-08 NOTE — Patient Instructions (Signed)
We have sent in the lexapro (escitalopram) that you can take 1 pill daily. It may take 2-4 weeks until it is in your body fully. Call us or let us know if you have any problems or questions about it.   Keep taking the imipramine but stay off the other medicines.   Stress and Stress Management Stress is a normal reaction to life events. It is what you feel when life demands more than you are used to or more than you can handle. Some stress can be useful. For example, the stress reaction can help you catch the last bus of the day, study for a test, or meet a deadline at work. But stress that occurs too often or for too long can cause problems. It can affect your emotional health and interfere with relationships and normal daily activities. Too much stress can weaken your immune system and increase your risk for physical illness. If you already have a medical problem, stress can make it worse. CAUSES  All sorts of life events may cause stress. An event that causes stress for one person may not be stressful for another person. Major life events commonly cause stress. These may be positive or negative. Examples include losing your job, moving into a new home, getting married, having a baby, or losing a loved one. Less obvious life events may also cause stress, especially if they occur day after day or in combination. Examples include working long hours, driving in traffic, caring for children, being in debt, or being in a difficult relationship. SIGNS AND SYMPTOMS Stress may cause emotional symptoms including, the following:  Anxiety. This is feeling worried, afraid, on edge, overwhelmed, or out of control.  Anger. This is feeling irritated or impatient.  Depression. This is feeling sad, down, helpless, or guilty.  Difficulty focusing, remembering, or making decisions. Stress may cause physical symptoms, including the following:   Aches and pains. These may affect your head, neck, back, stomach, or  other areas of your body.  Tight muscles or clenched jaw.  Low energy or trouble sleeping. Stress may cause unhealthy behaviors, including the following:   Eating to feel better (overeating) or skipping meals.  Sleeping too little, too much, or both.  Working too much or putting off tasks (procrastination).  Smoking, drinking alcohol, or using drugs to feel better. DIAGNOSIS  Stress is diagnosed through an assessment by your health care provider. Your health care provider will ask questions about your symptoms and any stressful life events.Your health care provider will also ask about your medical history and may order blood tests or other tests. Certain medical conditions and medicine can cause physical symptoms similar to stress. Mental illness can cause emotional symptoms and unhealthy behaviors similar to stress. Your health care provider may refer you to a mental health professional for further evaluation.  TREATMENT  Stress management is the recommended treatment for stress.The goals of stress management are reducing stressful life events and coping with stress in healthy ways.  Techniques for reducing stressful life events include the following:  Stress identification. Self-monitor for stress and identify what causes stress for you. These skills may help you to avoid some stressful events.  Time management. Set your priorities, keep a calendar of events, and learn to say "no." These tools can help you avoid making too many commitments. Techniques for coping with stress include the following:  Rethinking the problem. Try to think realistically about stressful events rather than ignoring them or overreacting. Try to find  the positives in a stressful situation rather than focusing on the negatives.  Exercise. Physical exercise can release both physical and emotional tension. The key is to find a form of exercise you enjoy and do it regularly.  Relaxation techniques. These relax  the body and mind. Examples include yoga, meditation, tai chi, biofeedback, deep breathing, progressive muscle relaxation, listening to music, being out in nature, journaling, and other hobbies. Again, the key is to find one or more that you enjoy and can do regularly.  Healthy lifestyle. Eat a balanced diet, get plenty of sleep, and do not smoke. Avoid using alcohol or drugs to relax.  Strong support network. Spend time with family, friends, or other people you enjoy being around.Express your feelings and talk things over with someone you trust. Counseling or talktherapy with a mental health professional may be helpful if you are having difficulty managing stress on your own. Medicine is typically not recommended for the treatment of stress.Talk to your health care provider if you think you need medicine for symptoms of stress. HOME CARE INSTRUCTIONS  Keep all follow-up visits as directed by your health care provider.  Take all medicines as directed by your health care provider. SEEK MEDICAL CARE IF:  Your symptoms get worse or you start having new symptoms.  You feel overwhelmed by your problems and can no longer manage them on your own. SEEK IMMEDIATE MEDICAL CARE IF:  You feel like hurting yourself or someone else.   This information is not intended to replace advice given to you by your health care provider. Make sure you discuss any questions you have with your health care provider.   Document Released: 09/22/2000 Document Revised: 04/19/2014 Document Reviewed: 11/21/2012 Elsevier Interactive Patient Education [employer].

## 2015-08-08 NOTE — Assessment & Plan Note (Signed)
She will continue imipramine since it is helping with sleeping. Add lexapro for the mood stability. She is also working on stress reduction (but she is raising her grandchildren which adds stress). Given coping strategies. She declines counseling and will continue to encourage.

## 2015-08-08 NOTE — Progress Notes (Signed)
Pre visit review using our clinic review tool, if applicable. No additional management support is needed unless otherwise documented below in the visit note. 

## 2015-08-08 NOTE — Progress Notes (Signed)
   Subjective:    Patient ID: Monique Swanson, female    DOB: 1955-12-07, 60 y.o.   MRN: [id number]  HPI The patient is a 60 YO female coming in for follow up of her depression. She stopped taking 3 of her medicines recently on her own. She is still taking imipramine and stopped trazodone, paxil, and wellbutrin. She is feeling okay but still with some emotional lability with easy crying. Denies SI/HI. She is feeling more like herself and is happy with the change.   Review of Systems  Constitutional: Negative for fever, activity change, appetite change, fatigue and unexpected weight change.  HENT: Negative.   Eyes: Negative.   Respiratory: Negative for cough, chest tightness, shortness of breath and wheezing.   Cardiovascular: Negative for chest pain, palpitations and leg swelling.  Gastrointestinal: Negative for nausea, abdominal pain, diarrhea, constipation and abdominal distention.  Musculoskeletal: Positive for back pain and arthralgias. Negative for myalgias and gait problem.  Skin: Negative.   Neurological: Negative.   Psychiatric/Behavioral: Positive for dysphoric mood and decreased concentration. Negative for suicidal ideas, behavioral problems, sleep disturbance and self-injury. The patient is not nervous/anxious.       Objective:   Physical Exam  Constitutional: She is oriented to person, place, and time. She appears well-developed and well-nourished.  HENT:  Head: Normocephalic and atraumatic.  Eyes: EOM are normal.  Neck: Normal range of motion.  Cardiovascular: Normal rate and regular rhythm.   Pulmonary/Chest: Effort normal and breath sounds normal. No respiratory distress. She has no wheezes. She has no rales.  Abdominal: Soft. She exhibits no distension. There is no tenderness. There is no rebound.  Musculoskeletal: She exhibits no edema.  Neurological: She is alert and oriented to person, place, and time. Coordination normal.  Skin: Skin is warm and dry.   Filed Vitals:     08/08/15 0938  BP: 108/72  Pulse: 103  Temp: 98.9 F (37.2 C)  TempSrc: Oral  Resp: 14  Height: '5\' 2"'$  (1.575 m)  Weight: 132 lb 12.8 oz (60.238 kg)  SpO2: 92%      Assessment & Plan:

## 2015-08-27 DIAGNOSIS — Z1231 Encounter for screening mammogram for malignant neoplasm of breast: Secondary | ICD-10-CM | POA: Diagnosis not present

## 2015-08-27 LAB — HM MAMMOGRAPHY

## 2015-09-16 ENCOUNTER — Encounter: Payer: Self-pay | Admitting: Internal Medicine

## 2015-11-05 ENCOUNTER — Ambulatory Visit (INDEPENDENT_AMBULATORY_CARE_PROVIDER_SITE_OTHER): Payer: Medicare Other | Admitting: Internal Medicine

## 2015-11-05 ENCOUNTER — Encounter: Payer: Self-pay | Admitting: Internal Medicine

## 2015-11-05 DIAGNOSIS — F330 Major depressive disorder, recurrent, mild: Secondary | ICD-10-CM

## 2015-11-05 MED ORDER — ESCITALOPRAM OXALATE 20 MG PO TABS: 20.0000 mg | ORAL_TABLET | Freq: Every day | ORAL | 6 refills | Status: DC

## 2015-11-05 NOTE — Patient Instructions (Signed)
We have sent in the stronger version of the lexapro for you to the pharmacy. If you have some left you can take 2 of the 10 mg lexapro. When you get the new prescription it will be 20 mg and you can take 1 pill daily.

## 2015-11-05 NOTE — Progress Notes (Signed)
Pre visit review using our clinic review tool, if applicable. No additional management support is needed unless otherwise documented below in the visit note. 

## 2015-11-05 NOTE — Progress Notes (Signed)
   Subjective:    Patient ID: Monique Swanson, female    DOB: Sep 09, 1955, 60 y.o.   MRN: [id number]  HPI The patient is a 60 YO female coming in for follow up of her depression. She has started taking the lexapro and is feeling some better. She is not crying all the time. She is having more energy and more motivation. She is not as social still and not leaving the house more. She is still going through some social unrest in her life but feels she is doing a little better with it. She is maybe 60% better.   Review of Systems  Constitutional: Negative for activity change, appetite change, fatigue, fever and unexpected weight change.  Respiratory: Negative for cough, chest tightness, shortness of breath and wheezing.   Cardiovascular: Negative for chest pain, palpitations and leg swelling.  Gastrointestinal: Negative for abdominal distention, abdominal pain, constipation, diarrhea and nausea.  Musculoskeletal: Positive for arthralgias and back pain. Negative for gait problem and myalgias.  Skin: Negative.   Neurological: Negative.   Psychiatric/Behavioral: Positive for decreased concentration and dysphoric mood. Negative for behavioral problems, self-injury, sleep disturbance and suicidal ideas. The patient is not nervous/anxious.        Better overall      Objective:   Physical Exam  Constitutional: She is oriented to person, place, and time. She appears well-developed and well-nourished.  HENT:  Head: Normocephalic and atraumatic.  Eyes: EOM are normal.  Neck: Normal range of motion.  Cardiovascular: Normal rate and regular rhythm.   Pulmonary/Chest: Effort normal and breath sounds normal. No respiratory distress. She has no wheezes. She has no rales.  Abdominal: Soft. She exhibits no distension. There is no tenderness. There is no rebound.  Musculoskeletal: She exhibits no edema.  Neurological: She is alert and oriented to person, place, and time. Coordination normal.  Skin: Skin is warm  and dry.  Psychiatric:  Better during the visit, still some flat affect.    Vitals:   11/05/15 0944  BP: 120/68  Pulse: 92  Resp: 12  Temp: 98.3 F (36.8 C)  TempSrc: Oral  SpO2: 96%  Weight: 133 lb (60.3 kg)  Height: '5\' 2"'$  (1.575 m)      Assessment & Plan:

## 2015-11-05 NOTE — Assessment & Plan Note (Signed)
Will increase lexapro to 20 mg daily for better but still uncontrolled symptoms. Having some mild headaches as side effect but tolerable. She will send [place] a message in about 4-6 weeks with how she is doing.

## 2016-01-27 IMAGING — MR MR CERVICAL SPINE W/O CM
4 of 5 series · 21 of 48 positions shown · non-contrast
Comparison: Plain radiographs 07/28/2014.

CLINICAL DATA: Neck and LEFT shoulder pain for 4 weeks. Weakness in
LEFT arm.

EXAM:
MRI CERVICAL SPINE WITHOUT CONTRAST
TECHNIQUE: Multiplanar, multisequence MR imaging of the cervical spine was
performed. No intravenous contrast was administered.

[Series 3: T1 · sagittal · 3.0mm · 0.41mm/px · 3 of 12 slices shown]
[im 3/12]
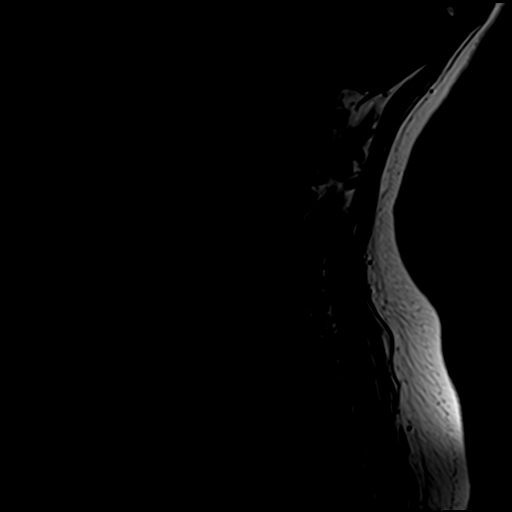
[im 7/12]
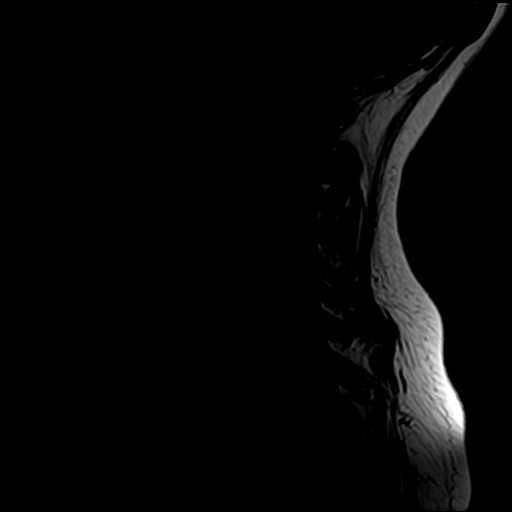
[im 12/12]
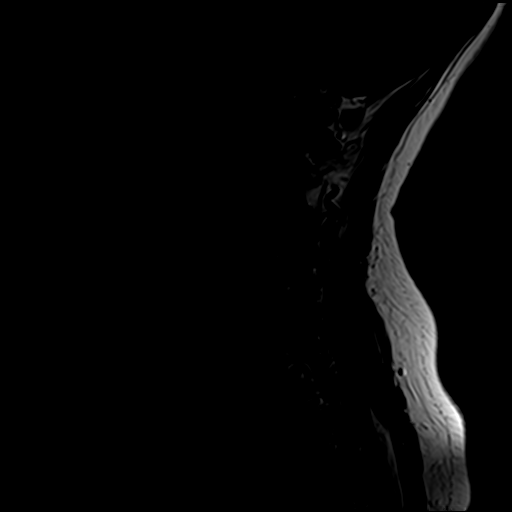

[Series 4: T2 · sagittal · 3.0mm · 0.41mm/px · 7 of 12 slices shown (1 of 3)]
[im 1/12]
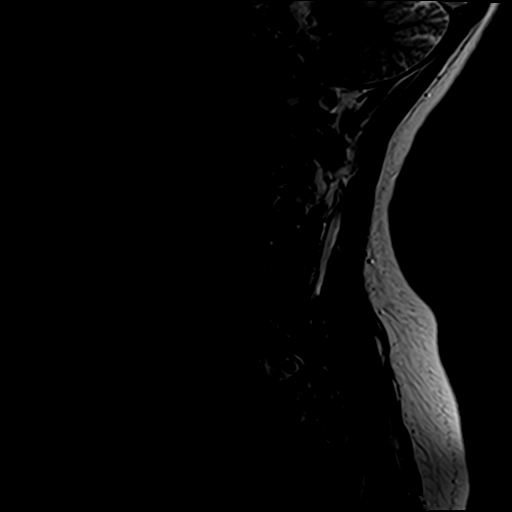
[im 2/12]
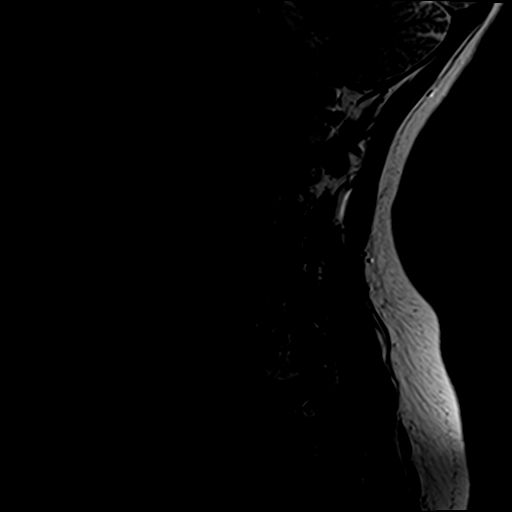
[im 4/12]
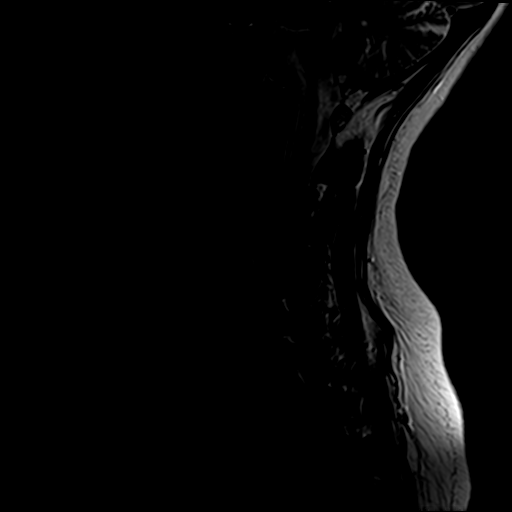
[im 6/12]
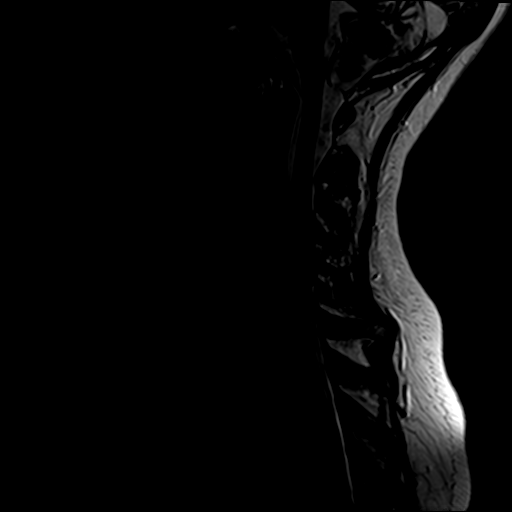
[im 8/12]
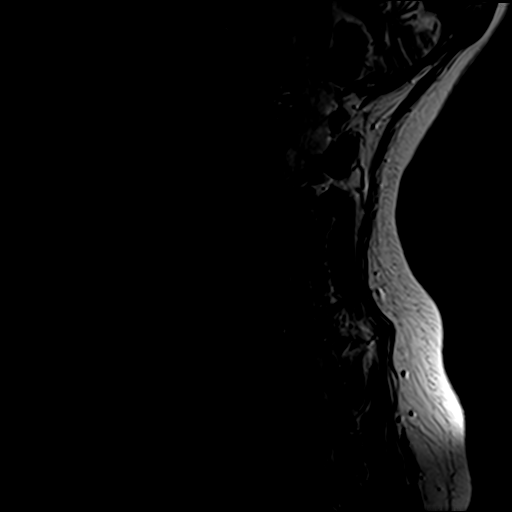
[im 10/12]
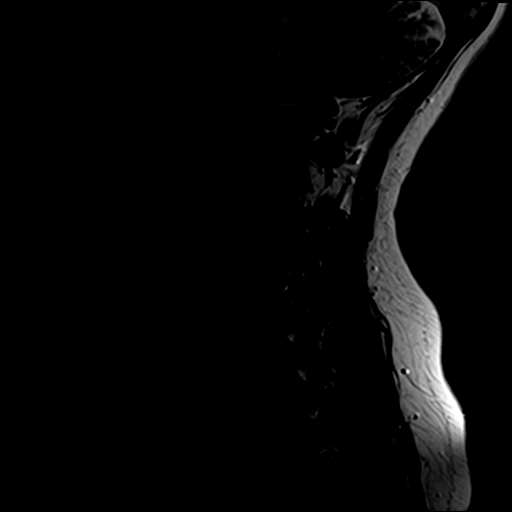
[im 12/12]
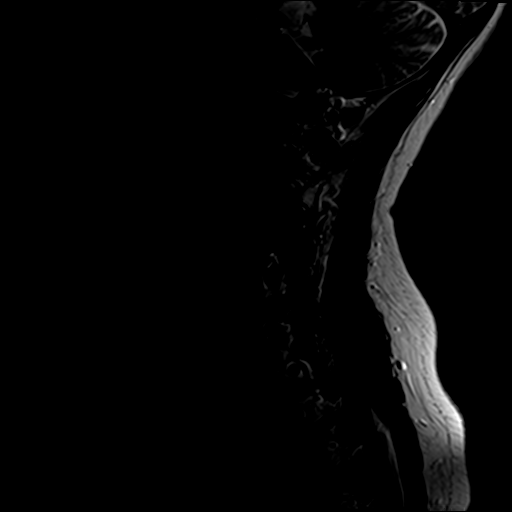

[Series 6: T2 · axial · 3.0mm · 0.39mm/px · z∈[-47,+46]mm · 8 of 26 slices shown (2 of 3)]
[im 1/26]
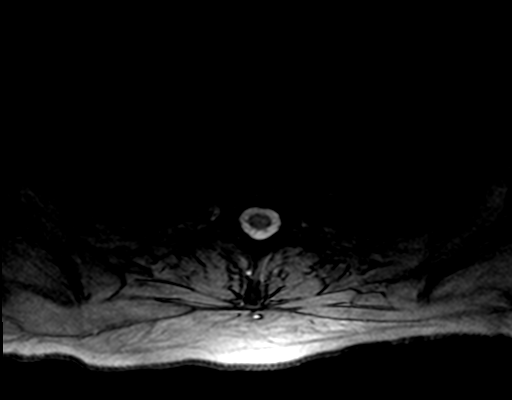
[im 4/26]
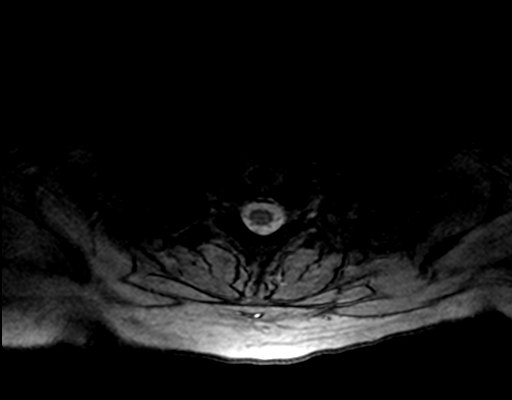
[im 8/26]
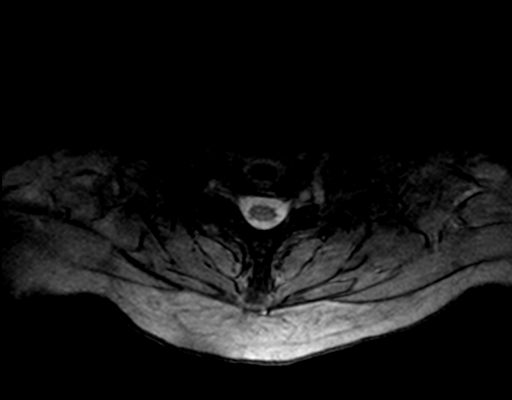
[im 12/26]
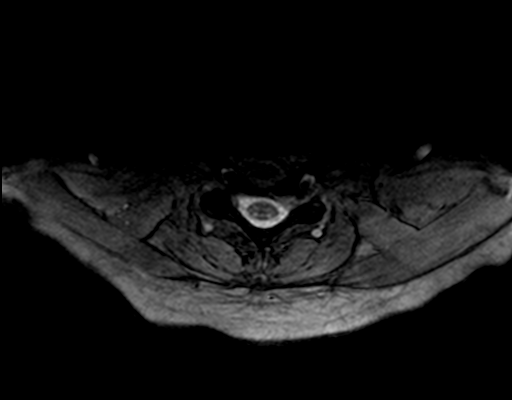
[im 14/26]
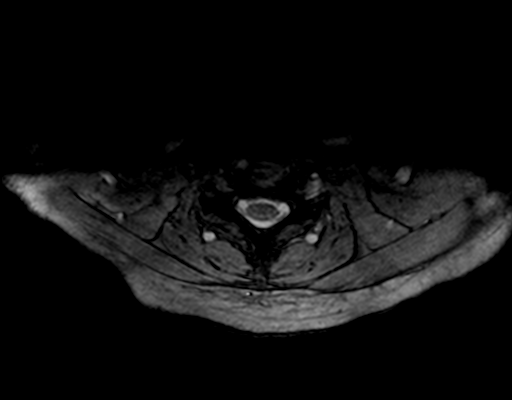
[im 18/26]
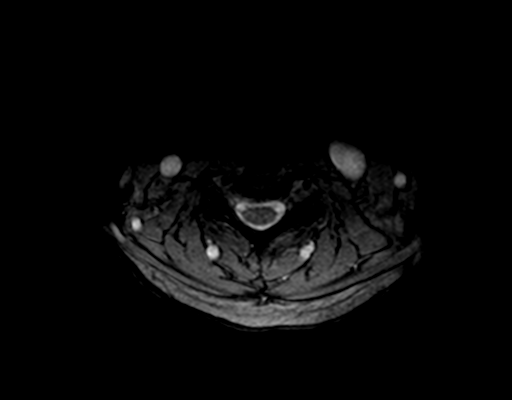
[im 22/26]
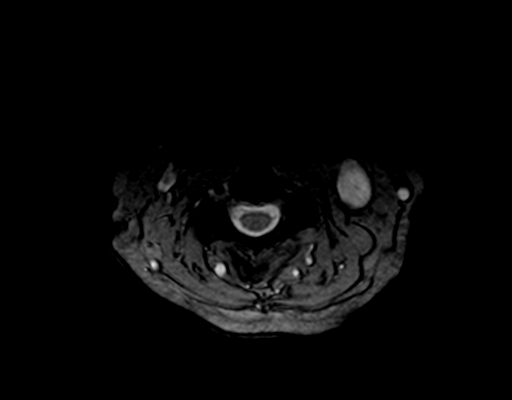
[im 26/26]
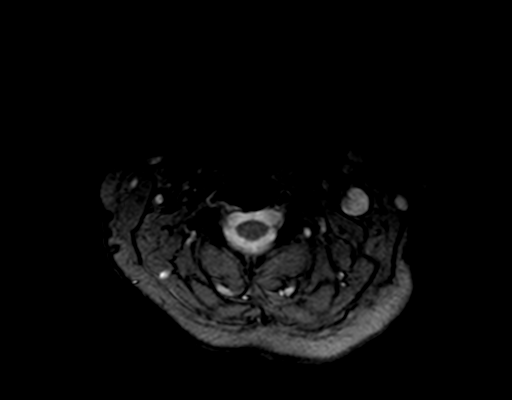

[Series 7: T2 · axial · 3.0mm · 0.39mm/px · z∈[-37,+31]mm · 3 of 26 slices shown (3 of 3)]
[im 4/26]
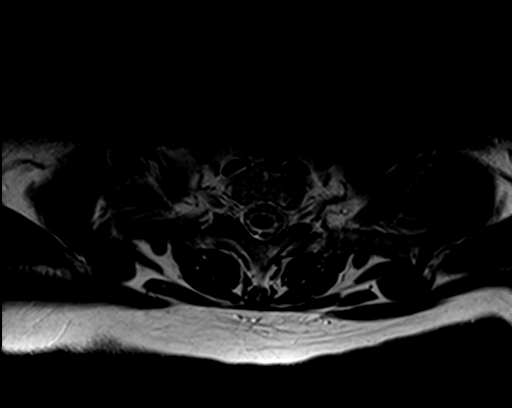
[im 14/26]
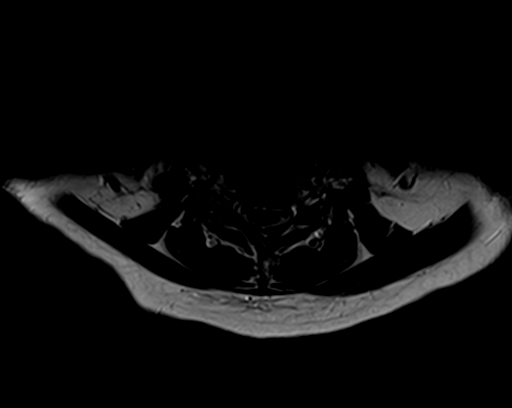
[im 22/26]
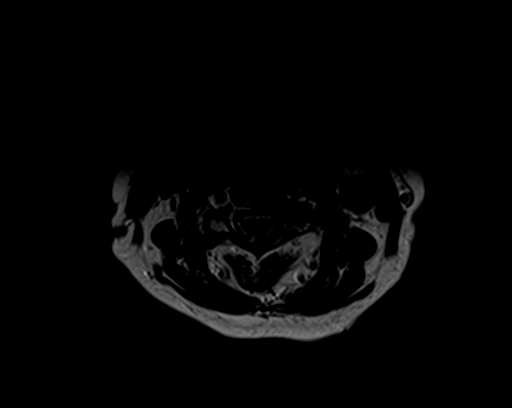

[21 of 48 positions shown; findings below may reference images not displayed]

FINDINGS: Alignment: Anatomic.

Vertebrae: No worrisome osseous lesion.

Cord: Normal.

Posterior Fossa: No tonsillar herniation.

Vertebral Arteries: BILATERAL patent.

Paraspinal tissues: Unremarkable.

Disc levels:

The individual disc spaces were examined as follows:

C2-3:  Normal disc space.

C3-4: Disc space narrowing. Annular bulging. LEFT-sided uncinate
spurring and facet arthropathy. LEFT C4 nerve root impingement.

C4-5: Central and rightward protrusion. Slight effacement anterior
subarachnoid space without significant cord flattening. RIGHT C5
nerve root impingement is likely.

C5-6:  Annular bulging.  No impingement.

C6-7: Central and leftward disc extrusion is far-lateral, in the
foramen. Significant LEFT C7 nerve root compression.

C7-T1:  Unremarkable.
IMPRESSION: The dominant LEFT-sided abnormality is at C6-C7 where a large disc
extrusion extends into the neural foramen. Significant LEFT C7 nerve
root impingement is present.

Potentially symptomatic LEFT-sided neural impingement also, not as
severe, at C3-4 due to uncinate spurring and asymmetric facet
arthropathy, both on the LEFT.

Central and rightward protrusion at C4-5. RIGHT C5 nerve root
compression is likely.

## 2016-02-03 ENCOUNTER — Telehealth: Payer: Self-pay | Admitting: Gastroenterology

## 2016-02-03 DIAGNOSIS — R197 Diarrhea, unspecified: Secondary | ICD-10-CM

## 2016-02-05 NOTE — Telephone Encounter (Signed)
Pt states that diarrhea has returned, she states that she has episodes about 2 weeks a month.  Wants to know what else she can try?  Takes sulfasalazine 500 mg 4 tabs three times daily.  Please advise

## 2016-02-06 MED ORDER — BUDESONIDE 3 MG PO CPEP: 9.0000 mg | ORAL_CAPSULE | Freq: Every day | ORAL | 3 refills | Status: DC

## 2016-02-06 NOTE — Telephone Encounter (Signed)
Stool studies in the system the pt has been notified if the entocort and it has been sent to the pharmacy.  Pt will call in 2 weeks to update on her response, ROV scheduled

## 2016-02-06 NOTE — Telephone Encounter (Signed)
She needs stool studies (gi pathogen panel), she should retry entocort 62m pills, 3pills once daily. Disp one month with 3 refills. She should call about her response in 2 weeks.  Also next available rov .  Thanks

## 2016-02-26 ENCOUNTER — Telehealth: Payer: Self-pay | Admitting: Gastroenterology

## 2016-02-26 NOTE — Telephone Encounter (Signed)
Pt takes 3 entocort pills at night and says her diarrhea has resolved.  She wanted to update Dr Ardis Hughs on her condition.

## 2016-02-27 NOTE — Telephone Encounter (Signed)
Pt has been advised and will decrease the entocort to twice daily for 4 weeks and then 1 daily until ROV.

## 2016-02-27 NOTE — Telephone Encounter (Signed)
Glad to know it's working. She can back to to 2 pills once daily for the next 4 weeks.  Then decrease to 1 pill once daily until she comes in for ROV  thanks

## 2016-03-04 ENCOUNTER — Other Ambulatory Visit: Payer: Self-pay | Admitting: Gastroenterology

## 2016-03-17 NOTE — Progress Notes (Signed)
Subjective:   Monique Swanson is a 60 y.o. female who presents for Medicare Annual (Subsequent) preventive examination.  The Patient was informed that the wellness visit is to identify future health risk and educate and initiate measures that can reduce risk for increased disease through the lifespan.   Describes health as fair, good or great? "fair" Retired. Enjoys watching television and reading.    Review of Systems:  No ROS.  Medicare Wellness Visit.  Cardiac Risk Factors include: family history of premature cardiovascular disease;smoking/ tobacco exposure;sedentary lifestyle   Sleep patterns: Sleeps 6-7 hours.  Home Safety/Smoke Alarms:  Smoke detectors in place.  Living environment; residence and Firearm Safety: 2 grandchildren (teenagers) live with patient in 1 story home. No firearms. Feels safe in home.  Seat Belt Safety/Bike Helmet: Wears seat belt.    Counseling:   Eye Exam-Last exam >5 years. Patient reports cataracts, does not want surgery. Patient advised to make f/u appointment.   Dental-Last exam 2 years ago. Seen for problems only.   Female:   Pap-N/A      Mammo-08/27/15, negative.        Dexa scan-06/01/2012, osteoporosis. Patient not sure she wants repeated at this time.     [facility]-Colonoscopy 11/14/2012,  Lymphocytic colitis. Followed by Gi.   Patient reports being very busy with her grandchildren and their health rather than her own. Discussed taking care of herself physically and mentally in order to care for her family.      Objective:     Vitals: BP 112/70 (BP Location: Right Arm, Patient Position: Sitting, Cuff Size: Normal)   Pulse 77   Ht 5' 2"  (1.575 m)   Wt 140 lb 1.9 oz (63.6 kg)   SpO2 95%   BMI 25.63 kg/m   Body mass index is 25.63 kg/m.   Tobacco History  Smoking Status  . Current Every Day Smoker  . Years: 35.00  . Types: Cigarettes  Smokeless Tobacco  . Never Used    Comment: Widowed form given 03-14-12     Ready to quit:  No Counseling given: No   Past Medical History:  Diagnosis Date  . CARCINOMA, SQUAMOUS CELL 2009   s/p rescrtion from R nose bridge  . DEPRESSION   . Headache(784.0)   . Hx of cardiovascular stress test    Lex MV 6/14:  Normal, EF 80%  . Irritable bowel syndrome   . Microscopic colitis dx 02/2009   chronic diarrhea  . Osteoarthritis   . OSTEOPENIA   . Osteoporosis, unspecified 09/30/2008   [facility] DEXA 05/2012: -2.7, recommended to start bisphos     Past Surgical History:  Procedure Laterality Date  . ABDOMINAL HYSTERECTOMY  1985  . APPENDECTOMY  1969  . BACK SURGERY  09/16/2014   disk replacement with fusion  . BREAST BIOPSY  1987   left  . BUNIONECTOMY Right   . FOOT SURGERY  1990   for joint in toes; bilateral  . TUBAL LIGATION     Family History  Problem Relation Age of Onset  . Dementia Mother   . Coronary artery disease Mother   . Diabetes Mother   . Asthma Father   . Ovarian cancer Sister   . Diabetes Sister   . Heart disease Sister     x   . Irritable bowel syndrome Sister   . Colon cancer Neg Hx    History  Sexual Activity  . Sexual activity: Not on file    Outpatient Encounter Prescriptions as of  03/18/2016  Medication Sig  . albuterol (PROVENTIL HFA;VENTOLIN HFA) 108 (90 BASE) MCG/ACT inhaler Inhale 2 puffs into the lungs every 6 (six) hours as needed for wheezing or shortness of breath.  . budesonide (ENTOCORT EC) 3 MG 24 hr capsule Take 3 capsules (9 mg total) by mouth daily.  . cholecalciferol (VITAMIN D) 1000 UNITS tablet Take 1 tablet (1,000 Units total) by mouth daily.  Marland Kitchen escitalopram (LEXAPRO) 20 MG tablet Take 1 tablet (20 mg total) by mouth daily.  . folic acid (FOLVITE) 1 MG tablet Take 1 tablet (1 mg total) by mouth daily.  Marland Kitchen imipramine (TOFRANIL) 50 MG tablet TAKE 1 TABLET (50 MG TOTAL) BY MOUTH AT BEDTIME.  Marland Kitchen loperamide (IMODIUM) 2 MG capsule Take 2 mg by mouth 4 (four) times daily as needed for diarrhea or loose stools (pt states she takes  8-10 times a day).   . [DISCONTINUED] ranitidine (ZANTAC) 150 MG tablet Take 1 tablet (150 mg total) by mouth 2 (two) times daily. (Patient not taking: Reported on 03/18/2016)  . [DISCONTINUED] sulfaSALAzine (AZULFIDINE) 500 MG tablet TAKE 4 TABLETS (2,000 MG TOTAL) BY MOUTH 3 (THREE) TIMES DAILY. (Patient not taking: Reported on 03/18/2016)   No facility-administered encounter medications on file as of 03/18/2016.     Activities of Daily Living In your present state of health, do you have any difficulty performing the following activities: 03/18/2016  Hearing? N  Vision? N  Difficulty concentrating or making decisions? N  Walking or climbing stairs? N  Dressing or bathing? N  Doing errands, shopping? N  Preparing Food and eating ? N  Using the Toilet? N  In the past six months, have you accidently leaked urine? N  Do you have problems with loss of bowel control? N  Managing your Medications? N  Managing your Finances? N  Housekeeping or managing your Housekeeping? N  Some recent data might be hidden    Patient Care Team: Hoyt Koch, MD as PCP - General (Internal Medicine) Milus Banister, MD (Gastroenterology)    Assessment:    Physical assessment deferred to PCP.  Exercise Activities and Dietary recommendations Current Exercise Habits: The patient does not participate in regular exercise at present, Exercise limited by: Other - see comments (back injury)   Diet (meal preparation, eat out, water intake, caffeinated beverages, dairy products, fruits and vegetables): Eats at home. Drinks soda and water.   Breakfast: Coffee (2 cups) Lunch: snacks on chips, crackers Dinner: Baked chicken, vegetables, potatoes.   Encouraged patient to not skip meals, try eating 4-5 small meals daily. Increasing protein, fruits and vegetables as tolerated (colitis). Discussed increasing water, limiting soda.   Encouraged to increase activity, including low impact exercises that will not  hurt her back.   Goals      Patient Stated   . <enter goal here> (pt-stated)          "to get rid of my diarrhea", follow up with GI provider as needed.       Fall Risk Fall Risk  03/18/2016  Falls in the past year? Yes  Number falls in past yr: 2 or more  Injury with Fall? No  Risk for fall due to : Impaired balance/gait  Follow up Falls prevention discussed   Patient reports falls are from dizziness caused by blurry vision. Patient to make eye appointment.   Depression Screen No flowsheet data found.   Cognitive Function       Ad8 score reviewed for issues:  Issues  making decisions:no  Less interest in hobbies / activities:no  Repeats questions, stories (family complaining):no  Trouble using ordinary gadgets (microwave, computer, phone):no  Forgets the month or year: no  Mismanaging finances: no  Remembering appts:no  Daily problems with thinking and/or memory:no Ad8 score is=0     Immunization History  Administered Date(s) Administered  . Influenza,inj,Quad PF,36+ Mos 03/18/2016  . Tdap 09/11/2014   Screening Tests Health Maintenance  Topic Date Due  . ZOSTAVAX  03/18/2017 (Originally 10/08/2015)  . HIV Screening  03/18/2017 (Originally 10/08/1970)  . MAMMOGRAM  08/26/2017  . COLONOSCOPY  11/15/2022  . TETANUS/TDAP  09/10/2024  . INFLUENZA VACCINE  Completed  . Hepatitis C Screening  Completed   Flu Vaccine administered today.     Plan:     Eat heart healthy diet (full of fruits, vegetables, whole grains, lean protein, water--limit salt, fat, and sugar intake) and increase physical activity as tolerated.  Continue doing brain stimulating activities (puzzles, reading, adult coloring books, staying active) to keep memory sharp.   Make eye appointment.  During the course of the visit the patient was educated and counseled about the following appropriate screening and preventive services:   Vaccines to include Pneumoccal, Influenza, Hepatitis  B, Td, Zostavax, HCV  Cardiovascular Disease  Colorectal cancer screening  Bone density screening  Diabetes screening  Glaucoma screening  Mammography/PAP  Nutrition counseling   Patient Instructions (the written plan) was given to the patient.   Gerilyn Nestle, RN  03/18/2016

## 2016-03-17 NOTE — Progress Notes (Signed)
Pre visit review using our clinic review tool, if applicable. No additional management support is needed unless otherwise documented below in the visit note. 

## 2016-03-18 ENCOUNTER — Ambulatory Visit (INDEPENDENT_AMBULATORY_CARE_PROVIDER_SITE_OTHER): Payer: Medicare Other

## 2016-03-18 ENCOUNTER — Other Ambulatory Visit: Payer: Medicare Other

## 2016-03-18 DIAGNOSIS — Z23 Encounter for immunization: Secondary | ICD-10-CM

## 2016-03-18 DIAGNOSIS — Z0000 Encounter for general adult medical examination without abnormal findings: Secondary | ICD-10-CM | POA: Diagnosis not present

## 2016-03-18 NOTE — Progress Notes (Signed)
Medical screening examination/treatment/procedure(s) were performed by non-physician practitioner and as supervising physician I was immediately available for consultation/collaboration. I agree with above. Tanelle Lanzo A Olivine Hiers, MD 

## 2016-03-18 NOTE — Patient Instructions (Addendum)
Eat heart healthy diet (full of fruits, vegetables, whole grains, lean protein, water--limit salt, fat, and sugar intake) and increase physical activity as tolerated.  Continue doing brain stimulating activities (puzzles, reading, adult coloring books, staying active) to keep memory sharp.   Make eye appointment.   Fall Prevention in the Home Introduction Falls can cause injuries. They can happen to people of all ages. There are many things you can do to make your home safe and to help prevent falls. What can I do on the outside of my home?  Regularly fix the edges of walkways and driveways and fix any cracks.  Remove anything that might make you trip as you walk through a door, such as a raised step or threshold.  Trim any bushes or trees on the path to your home.  Use bright outdoor lighting.  Clear any walking paths of anything that might make someone trip, such as rocks or tools.  Regularly check to see if handrails are loose or broken. Make sure that both sides of any steps have handrails.  Any raised decks and porches should have guardrails on the edges.  Have any leaves, snow, or ice cleared regularly.  Use sand or salt on walking paths during winter.  Clean up any spills in your garage right away. This includes oil or grease spills. What can I do in the bathroom?  Use night lights.  Install grab bars by the toilet and in the tub and shower. Do not use towel bars as grab bars.  Use non-skid mats or decals in the tub or shower.  If you need to sit down in the shower, use a plastic, non-slip stool.  Keep the floor dry. Clean up any water that spills on the floor as soon as it happens.  Remove soap buildup in the tub or shower regularly.  Attach bath mats securely with double-sided non-slip rug tape.  Do not have throw rugs and other things on the floor that can make you trip. What can I do in the bedroom?  Use night lights.  Make sure that you have a light by  your bed that is easy to reach.  Do not use any sheets or blankets that are too big for your bed. They should not hang down onto the floor.  Have a firm chair that has side arms. You can use this for support while you get dressed.  Do not have throw rugs and other things on the floor that can make you trip. What can I do in the kitchen?  Clean up any spills right away.  Avoid walking on wet floors.  Keep items that you use a lot in easy-to-reach places.  If you need to reach something above you, use a strong step stool that has a grab bar.  Keep electrical cords out of the way.  Do not use floor polish or wax that makes floors slippery. If you must use wax, use non-skid floor wax.  Do not have throw rugs and other things on the floor that can make you trip. What can I do with my stairs?  Do not leave any items on the stairs.  Make sure that there are handrails on both sides of the stairs and use them. Fix handrails that are broken or loose. Make sure that handrails are as long as the stairways.  Check any carpeting to make sure that it is firmly attached to the stairs. Fix any carpet that is loose or worn.  Avoid  having throw rugs at the top or bottom of the stairs. If you do have throw rugs, attach them to the floor with carpet tape.  Make sure that you have a light switch at the top of the stairs and the bottom of the stairs. If you do not have them, ask someone to add them for you. What else can I do to help prevent falls?  Wear shoes that:  Do not have high heels.  Have rubber bottoms.  Are comfortable and fit you well.  Are closed at the toe. Do not wear sandals.  If you use a stepladder:  Make sure that it is fully opened. Do not climb a closed stepladder.  Make sure that both sides of the stepladder are locked into place.  Ask someone to hold it for you, if possible.  Clearly mark and make sure that you can see:  Any grab bars or handrails.  First and  last steps.  Where the edge of each step is.  Use tools that help you move around (mobility aids) if they are needed. These include:  Canes.  Walkers.  Scooters.  Crutches.  Turn on the lights when you go into a dark area. Replace any light bulbs as soon as they burn out.  Set up your furniture so you have a clear path. Avoid moving your furniture around.  If any of your floors are uneven, fix them.  If there are any pets around you, be aware of where they are.  Review your medicines with your doctor. Some medicines can make you feel dizzy. This can increase your chance of falling. Ask your doctor what other things that you can do to help prevent falls. This information is not intended to replace advice given to you by your health care provider. Make sure you discuss any questions you have with your health care provider. Document Released: 01/23/2009 Document Revised: 09/04/2015 Document Reviewed: 05/03/2014  2017 [employer]  Health Maintenance, Female Introduction Adopting a healthy lifestyle and getting preventive care can go a long way to promote health and wellness. Talk with your health care provider about what schedule of regular examinations is right for you. This is a good chance for you to check in with your provider about disease prevention and staying healthy. In between checkups, there are plenty of things you can do on your own. Experts have done a lot of research about which lifestyle changes and preventive measures are most likely to keep you healthy. Ask your health care provider for more information. Weight and diet Eat a healthy diet  Be sure to include plenty of vegetables, fruits, low-fat dairy products, and lean protein.  Do not eat a lot of foods high in solid fats, added sugars, or salt.  Get regular exercise. This is one of the most important things you can do for your health.  Most adults should exercise for at least 150 minutes each week. The exercise  should increase your heart rate and make you sweat (moderate-intensity exercise).  Most adults should also do strengthening exercises at least twice a week. This is in addition to the moderate-intensity exercise. Maintain a healthy weight  Body mass index (BMI) is a measurement that can be used to identify possible weight problems. It estimates body fat based on height and weight. Your health care provider can help determine your BMI and help you achieve or maintain a healthy weight.  For females 57 years of age and older:  A BMI below 18.5  is considered underweight.  A BMI of 18.5 to 24.9 is normal.  A BMI of 25 to 29.9 is considered overweight.  A BMI of 30 and above is considered obese. Watch levels of cholesterol and blood lipids  You should start having your blood tested for lipids and cholesterol at 60 years of age, then have this test every 5 years.  You may need to have your cholesterol levels checked more often if:  Your lipid or cholesterol levels are high.  You are older than 60 years of age.  You are at high risk for heart disease. Cancer screening Lung Cancer  Lung cancer screening is recommended for adults 4-69 years old who are at high risk for lung cancer because of a history of smoking.  A yearly low-dose CT scan of the lungs is recommended for people who:  Currently smoke.  Have quit within the past 15 years.  Have at least a 30-pack-year history of smoking. A pack year is smoking an average of one pack of cigarettes a day for 1 year.  Yearly screening should continue until it has been 15 years since you quit.  Yearly screening should stop if you develop a health problem that would prevent you from having lung cancer treatment. Breast Cancer  Practice breast self-awareness. This means understanding how your breasts normally appear and feel.  It also means doing regular breast self-exams. Let your health care provider know about any changes, no matter  how small.  If you are in your 20s or 30s, you should have a clinical breast exam (CBE) by a health care provider every 1-3 years as part of a regular health exam.  If you are 45 or older, have a CBE every year. Also consider having a breast X-ray (mammogram) every year.  If you have a family history of breast cancer, talk to your health care provider about genetic screening.  If you are at high risk for breast cancer, talk to your health care provider about having an MRI and a mammogram every year.  Breast cancer gene (BRCA) assessment is recommended for women who have family members with BRCA-related cancers. BRCA-related cancers include:  Breast.  Ovarian.  Tubal.  Peritoneal cancers.  Results of the assessment will determine the need for genetic counseling and BRCA1 and BRCA2 testing. Cervical Cancer  Your health care provider may recommend that you be screened regularly for cancer of the pelvic organs (ovaries, uterus, and vagina). This screening involves a pelvic examination, including checking for microscopic changes to the surface of your cervix (Pap test). You may be encouraged to have this screening done every 3 years, beginning at age 103.  For women ages 66-65, health care providers may recommend pelvic exams and Pap testing every 3 years, or they may recommend the Pap and pelvic exam, combined with testing for human papilloma virus (HPV), every 5 years. Some types of HPV increase your risk of cervical cancer. Testing for HPV may also be done on women of any age with unclear Pap test results.  Other health care providers may not recommend any screening for nonpregnant women who are considered low risk for pelvic cancer and who do not have symptoms. Ask your health care provider if a screening pelvic exam is right for you.  If you have had past treatment for cervical cancer or a condition that could lead to cancer, you need Pap tests and screening for cancer for at least 20  years after your treatment. If Pap tests  have been discontinued, your risk factors (such as having a new sexual partner) need to be reassessed to determine if screening should resume. Some women have medical problems that increase the chance of getting cervical cancer. In these cases, your health care provider may recommend more frequent screening and Pap tests. Colorectal Cancer  This type of cancer can be detected and often prevented.  Routine colorectal cancer screening usually begins at 60 years of age and continues through 60 years of age.  Your health care provider may recommend screening at an earlier age if you have risk factors for colon cancer.  Your health care provider may also recommend using home test kits to check for hidden blood in the stool.  A small camera at the end of a tube can be used to examine your colon directly (sigmoidoscopy or colonoscopy). This is done to check for the earliest forms of colorectal cancer.  Routine screening usually begins at age 47.  Direct examination of the colon should be repeated every 5-10 years through 60 years of age. However, you may need to be screened more often if early forms of precancerous polyps or small growths are found. Skin Cancer  Check your skin from head to toe regularly.  Tell your health care provider about any new moles or changes in moles, especially if there is a change in a mole's shape or color.  Also tell your health care provider if you have a mole that is larger than the size of a pencil eraser.  Always use sunscreen. Apply sunscreen liberally and repeatedly throughout the day.  Protect yourself by wearing long sleeves, pants, a wide-brimmed hat, and sunglasses whenever you are outside. Heart disease, diabetes, and high blood pressure  High blood pressure causes heart disease and increases the risk of stroke. High blood pressure is more likely to develop in:  People who have blood pressure in the high end of  the normal range (130-139/85-89 mm Hg).  People who are overweight or obese.  People who are African American.  If you are 72-55 years of age, have your blood pressure checked every 3-5 years. If you are 58 years of age or older, have your blood pressure checked every year. You should have your blood pressure measured twice-once when you are at a hospital or clinic, and once when you are not at a hospital or clinic. Record the average of the two measurements. To check your blood pressure when you are not at a hospital or clinic, you can use:  An automated blood pressure machine at a pharmacy.  A home blood pressure monitor.  If you are between 17 years and 29 years old, ask your health care provider if you should take aspirin to prevent strokes.  Have regular diabetes screenings. This involves taking a blood sample to check your fasting blood sugar level.  If you are at a normal weight and have a low risk for diabetes, have this test once every three years after 60 years of age.  If you are overweight and have a high risk for diabetes, consider being tested at a younger age or more often. Preventing infection Hepatitis B  If you have a higher risk for hepatitis B, you should be screened for this virus. You are considered at high risk for hepatitis B if:  You were born in a country where hepatitis B is common. Ask your health care provider which countries are considered high risk.  Your parents were born in a high-risk  country, and you have not been immunized against hepatitis B (hepatitis B vaccine).  You have HIV or AIDS.  You use needles to inject [place] drugs.  You live with someone who has hepatitis B.  You have had sex with someone who has hepatitis B.  You get hemodialysis treatment.  You take certain medicines for conditions, including cancer, organ transplantation, and autoimmune conditions. Hepatitis C  Blood testing is recommended for:  Everyone born from [birth year]  through 1965.  Anyone with known risk factors for hepatitis C. Sexually transmitted infections (STIs)  You should be screened for sexually transmitted infections (STIs) including gonorrhea and chlamydia if:  You are sexually active and are younger than 60 years of age.  You are older than 60 years of age and your health care provider tells you that you are at risk for this type of infection.  Your sexual activity has changed since you were last screened and you are at an increased risk for chlamydia or gonorrhea. Ask your health care provider if you are at risk.  If you do not have HIV, but are at risk, it may be recommended that you take a prescription medicine daily to prevent HIV infection. This is called pre-exposure prophylaxis (PrEP). You are considered at risk if:  You are sexually active and do not regularly use condoms or know the HIV status of your partner(s).  You take drugs by injection.  You are sexually active with a partner who has HIV. Talk with your health care provider about whether you are at high risk of being infected with HIV. If you choose to begin PrEP, you should first be tested for HIV. You should then be tested every 3 months for as long as you are taking PrEP. Pregnancy  If you are premenopausal and you may become pregnant, ask your health care provider about preconception counseling.  If you may become pregnant, take 400 to 800 micrograms (mcg) of folic acid every day.  If you want to prevent pregnancy, talk to your health care provider about birth control (contraception). Osteoporosis and menopause  Osteoporosis is a disease in which the bones lose minerals and strength with aging. This can result in serious bone fractures. Your risk for osteoporosis can be identified using a bone density scan.  If you are 75 years of age or older, or if you are at risk for osteoporosis and fractures, ask your health care provider if you should be screened.  Ask your  health care provider whether you should take a calcium or vitamin D supplement to lower your risk for osteoporosis.  Menopause may have certain physical symptoms and risks.  Hormone replacement therapy may reduce some of these symptoms and risks. Talk to your health care provider about whether hormone replacement therapy is right for you. Follow these instructions at home:  Schedule regular health, dental, and eye exams.  Stay current with your immunizations.  Do not use any tobacco products including cigarettes, chewing tobacco, or electronic cigarettes.  If you are pregnant, do not drink alcohol.  If you are breastfeeding, limit how much and how often you drink alcohol.  Limit alcohol intake to no more than 1 drink per day for nonpregnant women. One drink equals 12 ounces of beer, 5 ounces of wine, or 1 ounces of hard liquor.  Do not use [place] drugs.  Do not share needles.  Ask your health care provider for help if you need support or information about quitting drugs.  Tell your  health care provider if you often feel depressed.  Tell your health care provider if you have ever been abused or do not feel safe at home. This information is not intended to replace advice given to you by your health care provider. Make sure you discuss any questions you have with your health care provider. Document Released: 10/12/2010 Document Revised: 09/04/2015 Document Reviewed: 12/31/2014  2017 [employer]vier

## 2016-04-13 ENCOUNTER — Other Ambulatory Visit: Payer: Medicare Other

## 2016-04-13 DIAGNOSIS — R197 Diarrhea, unspecified: Secondary | ICD-10-CM | POA: Diagnosis not present

## 2016-04-15 LAB — GASTROINTESTINAL PATHOGEN PANEL PCR
C. difficile Tox A/B, PCR: NOT DETECTED
Campylobacter, PCR: NOT DETECTED
Cryptosporidium, PCR: NOT DETECTED
E coli (ETEC) LT/ST PCR: NOT DETECTED
E coli (STEC) stx1/stx2, PCR: NOT DETECTED
E coli 0157, PCR: NOT DETECTED
Giardia lamblia, PCR: NOT DETECTED
Norovirus, PCR: NOT DETECTED
Rotavirus A, PCR: NOT DETECTED
Salmonella, PCR: NOT DETECTED
Shigella, PCR: NOT DETECTED

## 2016-04-19 ENCOUNTER — Encounter: Payer: Self-pay | Admitting: *Deleted

## 2016-04-21 ENCOUNTER — Encounter: Payer: Self-pay | Admitting: Gastroenterology

## 2016-04-21 ENCOUNTER — Ambulatory Visit (INDEPENDENT_AMBULATORY_CARE_PROVIDER_SITE_OTHER): Payer: Medicare Other | Admitting: Gastroenterology

## 2016-04-21 VITALS — BP 90/60 | HR 88 | Ht 61.75 in | Wt 141.2 lb

## 2016-04-21 DIAGNOSIS — R197 Diarrhea, unspecified: Secondary | ICD-10-CM | POA: Diagnosis not present

## 2016-04-21 DIAGNOSIS — K52832 Lymphocytic colitis: Secondary | ICD-10-CM

## 2016-04-21 MED ORDER — BUDESONIDE 9 MG PO TB24: 9.0000 ug | ORAL_TABLET | Freq: Every day | ORAL | 6 refills | Status: DC

## 2016-04-21 NOTE — Progress Notes (Signed)
Review of gastrointestinal problems:  1. Lymphocytic, microscopic colitis. Diarrhea for 10 years, eventually colonoscopy December, 2010 by Dr. Ardis Hughs. Essentially normal macroscopically however biopsies showed lymphocytic colitis. Budesonide was started With good improvement, added Imodium As well. February, 2011: taking two Entocort pills a day, for Imodium pills a day with great results. April, 2011 Off Entocort, takes 4-6 Imodium a day with great results. Seen by Nicoletta Ba in 2011, 2013. Variety of antidiarrheal agents tried. 2014 ROV, unclear if steroids ever helped per patient, trial of colestipol started. Recommended repeat colonoscopy given difficulty controlling her symptoms however cardiac issues intervened. Repeat colonoscopy 11/2012 Ardis Hughs, slightly erythematous mucosa in left colon; biopsies confirmed microscopic colitis again; started mesalamine orally. 1 g 3 times a day helped significantly but she still had intermittent flares.  Recurrence of symptoms 02/2016; GI pathogen panel negative, started entocort 3 pills daily    HPI: This is a  very pleasant 61 year old woman  Last I saw her was over year ago.  She had been taking sulfasalazine 12 pills, this was working fairly well.  Still needed imodium 8 per day.    Switched to entocort 3 pills daily.  Nightmares occurred and we dropped her to 2 pills per day.  She is still having 4-5 mushy consistency stools every day.  She still takes imodium 8 pills daily.  Chief complaint is chronic diarrhea  ROS: complete GI ROS as described in HPI.  Constitutional:  No unintentional weight loss   Past Medical History:  Diagnosis Date  . CARCINOMA, SQUAMOUS CELL 2009   s/p rescrtion from R nose bridge  . DEPRESSION   . Headache(784.0)   . Hx of cardiovascular stress test    Lex MV 6/14:  Normal, EF 80%  . Irritable bowel syndrome   . Lymphocytic colitis   . Microscopic colitis dx 02/2009   chronic diarrhea  . Osteoarthritis   .  OSTEOPENIA   . Osteoporosis, unspecified 09/30/2008   [facility] DEXA 05/2012: -2.7, recommended to start bisphos      Past Surgical History:  Procedure Laterality Date  . ABDOMINAL HYSTERECTOMY  1985  . APPENDECTOMY  1969  . BACK SURGERY  09/16/2014   disk replacement with fusion  . BREAST BIOPSY  1987   left  . BUNIONECTOMY Right   . FOOT SURGERY  1990   for joint in toes; bilateral  . TUBAL LIGATION      Current Outpatient Prescriptions  Medication Sig Dispense Refill  . albuterol (PROVENTIL HFA;VENTOLIN HFA) 108 (90 BASE) MCG/ACT inhaler Inhale 2 puffs into the lungs every 6 (six) hours as needed for wheezing or shortness of breath. 1 Inhaler 2  . budesonide (ENTOCORT EC) 3 MG 24 hr capsule Take 3 capsules (9 mg total) by mouth daily. 90 capsule 3  . cholecalciferol (VITAMIN D) 1000 UNITS tablet Take 1 tablet (1,000 Units total) by mouth daily.    Marland Kitchen escitalopram (LEXAPRO) 20 MG tablet Take 1 tablet (20 mg total) by mouth daily. 30 tablet 6  . folic acid (FOLVITE) 1 MG tablet Take 1 tablet (1 mg total) by mouth daily. 90 tablet 3  . imipramine (TOFRANIL) 50 MG tablet TAKE 1 TABLET (50 MG TOTAL) BY MOUTH AT BEDTIME. 90 tablet 3  . loperamide (IMODIUM) 2 MG capsule Take 2 mg by mouth 4 (four) times daily as needed for diarrhea or loose stools (pt states she takes 8-10 times a day).      No current facility-administered medications for this visit.  Allergies as of 04/21/2016 - Review Complete 04/21/2016  Allergen Reaction Noted  . Tetracycline Other (See Comments)     Family History  Problem Relation Age of Onset  . Dementia Mother   . Coronary artery disease Mother   . Diabetes Mother   . Asthma Father   . Ovarian cancer Sister   . Diabetes Sister   . Heart disease Sister     x   . Irritable bowel syndrome Sister   . Colon cancer Neg Hx     Social History   Social History  . Marital status: Widowed    Spouse name: N/A  . Number of children: 2  . Years of education:  N/A   Occupational History  . Disabled    Social History Main Topics  . Smoking status: Current Every Day Smoker    Years: 35.00    Types: Cigarettes  . Smokeless tobacco: Never Used     Comment: Widowed form given 03-14-12  . Alcohol use No     Comment: socially  . Drug use: No  . Sexual activity: Not on file   Other Topics Concern  . Not on file   Social History Narrative   Widowed 08/24/09, 2 grown children. son in [place] and dtr in [place]     Physical Exam: BP 90/60 (BP Location: Left Arm, Patient Position: Sitting, Cuff Size: Normal)   Pulse 88   Ht 5' 1.75" (1.568 m)   Wt 141 lb 4 oz (64.1 kg)   BMI 26.04 kg/m  Constitutional: generally well-appearing Psychiatric: alert and oriented x3 Abdomen: soft, nontender, nondistended, no obvious ascites, no peritoneal signs, normal bowel sounds No peripheral edema noted in lower extremities  Assessment and plan: 61 y.o. female with Lymphocytic colitis, chronic diarrhea  Entocort does not seem to be helping as well as I had hoped. I'm going to change her to Uceris instead. 9 mg pill one pill once daily. She will continue taking multiple Imodium per day as well. She will call my office in 2-3 weeks to report on her response. If she is still having 4-5 loose stools daily and I'm going to add back her sulfasalazine that she was on previously at 12 pills daily. We are going to book her for a return office visit in 2-3 months but I'm sure all be in contact with her by phone earlier than then.  Owens Loffler, MD [facility] 04/21/2016, 11:03 AM

## 2016-04-21 NOTE — Patient Instructions (Addendum)
Stop entocort  We have sent the following medications to your pharmacy for you to pick up at your convenience: Uceris 79m once daily. Disp 30 days, 6 refills.  Continue imodium 8 pills daily.  Call our office in 2-3 weeks.  Next step will be to add back sulfasalazine 12 pills daily.  Please return to see Monique Swanson Monday, 07/05/16 @ 9:45 am.  Your body mass index should be between 19-25. Your Body mass index is 26.04 kg/m. If this is out of the aformentioned range listed, please consider follow up with your Primary Care Provider.

## 2016-05-03 ENCOUNTER — Telehealth: Payer: Self-pay | Admitting: Gastroenterology

## 2016-05-03 NOTE — Telephone Encounter (Signed)
The pt states she has not responded to Uceris, or Entocort, per your note she will go back to sulfasalazine 12 pills daily if she did not have a good response with Uceris.  She will stop Uceris and go back to sulfasalazine. She does not need any refills at this time.  She has a follow up appt for 07/05/16.

## 2016-05-03 NOTE — Telephone Encounter (Signed)
Great - thanks

## 2016-06-01 ENCOUNTER — Other Ambulatory Visit: Payer: Self-pay | Admitting: Internal Medicine

## 2016-07-05 ENCOUNTER — Other Ambulatory Visit (INDEPENDENT_AMBULATORY_CARE_PROVIDER_SITE_OTHER): Payer: Medicare Other

## 2016-07-05 ENCOUNTER — Ambulatory Visit (INDEPENDENT_AMBULATORY_CARE_PROVIDER_SITE_OTHER): Payer: Medicare Other | Admitting: Gastroenterology

## 2016-07-05 ENCOUNTER — Encounter: Payer: Self-pay | Admitting: Gastroenterology

## 2016-07-05 VITALS — BP 100/70 | HR 76 | Ht 62.0 in | Wt 144.0 lb

## 2016-07-05 DIAGNOSIS — R197 Diarrhea, unspecified: Secondary | ICD-10-CM

## 2016-07-05 DIAGNOSIS — K52832 Lymphocytic colitis: Secondary | ICD-10-CM

## 2016-07-05 LAB — COMPREHENSIVE METABOLIC PANEL
ALT: 8 U/L (ref 0–35)
AST: 17 U/L (ref 0–37)
Albumin: 4.4 g/dL (ref 3.5–5.2)
Alkaline Phosphatase: 87 U/L (ref 39–117)
BUN: 13 mg/dL (ref 6–23)
CO2: 27 mEq/L (ref 19–32)
Calcium: 9.3 mg/dL (ref 8.4–10.5)
Chloride: 104 mEq/L (ref 96–112)
Creatinine, Ser: 0.98 mg/dL (ref 0.40–1.20)
GFR: 61.38 mL/min (ref >60.00)
Glucose, Bld: 77 mg/dL (ref 70–99)
Potassium: 4 mEq/L (ref 3.5–5.1)
Sodium: 135 mEq/L (ref 135–145)
Total Bilirubin: 0.5 mg/dL (ref 0.2–1.2)
Total Protein: 7.1 g/dL (ref 6.0–8.3)

## 2016-07-05 MED ORDER — DIPHENOXYLATE-ATROPINE 2.5-0.025 MG PO TABS: 2.0000 | ORAL_TABLET | Freq: Four times a day (QID) | ORAL | 5 refills | Status: DC

## 2016-07-05 MED ORDER — RANITIDINE HCL 150 MG PO TABS: 150.0000 mg | ORAL_TABLET | Freq: Two times a day (BID) | ORAL | 11 refills | Status: DC

## 2016-07-05 NOTE — Patient Instructions (Addendum)
New prescription for ranitidine 159m pill, 60 pills per month, 11 refills, take one pill with breakfast and one at bedtime. Lomotil, 2 pills four times daily. rx 240 pills, 5 refills.  Call in 2 weeks to report.  Please return to see Dr. JArdis Hughson 08/13/16 at 3 pm.  You will have labs checked today in the basement lab.  Please head down after you check out with the front desk  ([facility])  If you are age 2828or older, your body mass index should be between 23-30. Your Body mass index is 26.34 kg/m. If this is out of the aforementioned range listed, please consider follow up with your Primary Care Provider.  If you are age 2875or younger, your body mass index should be between 19-25. Your Body mass index is 26.34 kg/m. If this is out of the aformentioned range listed, please consider follow up with your Primary Care Provider.

## 2016-07-05 NOTE — Progress Notes (Signed)
Review of gastrointestinal problems:  1. Lymphocytic, microscopic colitis.Diarrhea for 10 years, eventually colonoscopy December, 2010 by Dr. Ardis Hughs. Essentially normal macroscopically however biopsies showed lymphocytic colitis.Budesonide was started With good improvement, added Imodium As well. February, 2011: taking two Entocort pills a day, for Imodium pills a day with great results. April, 2011 Off Entocort, takes 4-6 Imodium a day with great results. Seen by Nicoletta Ba in 2011, 2013. Variety of antidiarrheal agents tried. 2014 ROV, unclear if steroids ever helped per patient, trial of colestipol started. Recommended repeat colonoscopy given difficulty controlling her symptoms however cardiac issues intervened. Repeat colonoscopy 11/2012 Ardis Hughs, slightly erythematous mucosa in left colon; biopsies confirmed microscopic colitis again; started mesalamine orally. 1 g 3 times a day helped significantly but she still had intermittent flares.  Recurrence of symptoms 02/2016; GI pathogen panel negative, started entocort 3 pills daily. Had nightmares, decreased to 2 pills daily without improvement in symptoms. 04/2016 changed to uceris again without improvement, restarted sulfasalazine 4 pills tid.   HPI: This is a very pleasant 61 year old Swanson whom I last saw about 2 months ago  Chief complaint is persistent diarrhea, also GERD  Has been on suflasalazine 12 pills daily. Was working well for about a month, the last has had terrible watery diarrhea.  Still on 8 imodium daily.    Getting dehydrated.  Has been dizzy with headaches.  Having 8-10 loose stools daily.    She has been having worsening GERD symptoms lately. Heartburn. No dysphagia. Previously H2 blockers have helped her but she ran out of prescription for these. She is taking a lot of Tums daily.  ROS: complete GI ROS as described in HPI.  Constitutional:  No unintentional weight loss   Past Medical History:  Diagnosis Date  .  CARCINOMA, SQUAMOUS CELL 2009   s/p rescrtion from R nose bridge  . DEPRESSION   . Headache(784.0)   . Hx of cardiovascular stress test    Lex MV 6/14:  Normal, EF 80%  . Irritable bowel syndrome   . Lymphocytic colitis   . Microscopic colitis dx 02/2009   chronic diarrhea  . Osteoarthritis   . OSTEOPENIA   . Osteoporosis, unspecified 09/30/2008   [facility] DEXA 05/2012: -2.7, recommended to start bisphos      Past Surgical History:  Procedure Laterality Date  . ABDOMINAL HYSTERECTOMY  1985  . APPENDECTOMY  1969  . BACK SURGERY  09/16/2014   disk replacement with fusion  . BREAST BIOPSY  1987   left  . BUNIONECTOMY Right   . FOOT SURGERY  1990   for joint in toes; bilateral  . TUBAL LIGATION      Current Outpatient Prescriptions  Medication Sig Dispense Refill  . albuterol (PROVENTIL HFA;VENTOLIN HFA) 108 (90 BASE) MCG/ACT inhaler Inhale 2 puffs into the lungs every 6 (six) hours as needed for wheezing or shortness of breath. 1 Inhaler 2  . cholecalciferol (VITAMIN D) 1000 UNITS tablet Take 1 tablet (1,000 Units total) by mouth daily.    Marland Kitchen escitalopram (LEXAPRO) 20 MG tablet TAKE 1 TABLET BY MOUTH DAILY 30 tablet 6  . folic acid (FOLVITE) 1 MG tablet Take 1 tablet (1 mg total) by mouth daily. 90 tablet 3  . imipramine (TOFRANIL) 50 MG tablet TAKE 1 TABLET (50 MG TOTAL) BY MOUTH AT BEDTIME. 90 tablet 3  . loperamide (IMODIUM) 2 MG capsule Take 2 mg by mouth 4 (four) times daily as needed for diarrhea or loose stools (pt states she takes 8-10 times  a day).     Marland Kitchen sulfaSALAzine (AZULFIDINE) 500 MG tablet Take 2,000 mg by mouth 4 (four) times daily.     No current facility-administered medications for this visit.     Allergies as of 07/05/2016 - Review Complete 07/05/2016  Allergen Reaction Noted  . Tetracycline Other (See Comments)     Family History  Problem Relation Age of Onset  . Dementia Mother   . Coronary artery disease Mother   . Diabetes Mother   . Asthma Father   .  Ovarian cancer Sister   . Diabetes Sister   . Heart disease Sister     x   . Irritable bowel syndrome Sister   . Colon cancer Neg Hx     Social History   Social History  . Marital status: Widowed    Spouse name: N/A  . Number of children: 2  . Years of education: N/A   Occupational History  . Disabled    Social History Main Topics  . Smoking status: Current Every Day Smoker    Years: Monique.00    Types: Cigarettes  . Smokeless tobacco: Never Used     Comment: Widowed form given 03-14-12  . Alcohol use No     Comment: socially  . Drug use: No  . Sexual activity: Not on file   Other Topics Concern  . Not on file   Social History Narrative   Widowed 08/24/09, 2 grown children. son in [place] and dtr in [place]     Physical Exam: BP 100/70   Pulse 76   Ht 5' 2"  (1.575 m)   Wt 144 lb (65.3 kg)   BMI 26.34 kg/m  Constitutional: generally well-appearing Psychiatric: alert and oriented x3 Abdomen: soft, nontender, nondistended, no obvious ascites, no peritoneal signs, normal bowel sounds No peripheral edema noted in lower extremities  Assessment and plan: 61 y.o. female with Lymphocytic colitis under poor control, GERD  I'm having difficulty controlling her lymphocytic colitis symptoms. Entocort, Uceris were either not helpful or caused side effects. Sulfasalazine which previously worked for her helped at first but she seeming to break through it now. She is also taking Imodium 8 pills a day. She had stool testing 2 months ago that showed no clear sign of infection. Going to add Lomotil 2 pills 4 times daily to her regimen. If this is not helpful then I would likely try her on tincture of opium and I would probably also recommend repeat colonoscopy to exclude other causes just to be certain again. I'm also calling in prescription for H2 blocker twice daily ranitidine 150 mg. This is helped her GERD in the past. She will return to see me in 2 months and sooner if  needed.  Please see the "Patient Instructions" section for addition details about the plan.  Owens Loffler, MD [facility] 07/05/2016, 9:52 AM

## 2016-07-19 ENCOUNTER — Encounter: Payer: Self-pay | Admitting: Gastroenterology

## 2016-07-19 ENCOUNTER — Telehealth: Payer: Self-pay | Admitting: Gastroenterology

## 2016-07-20 NOTE — Telephone Encounter (Signed)
Patient returning Patty's call

## 2016-07-20 NOTE — Telephone Encounter (Signed)
Left message on machine to call back  

## 2016-07-21 NOTE — Telephone Encounter (Signed)
Left message on machine to call back  

## 2016-07-21 NOTE — Telephone Encounter (Signed)
The pt states that she was taking sulfasalazine, lomotil and ranitidine as directed but felt very fatigued and stopped all medications.  She was continuing to have diarrhea so she has been taking imodium only.  I did advise her to keep the appt with Dr Ardis Hughs for 08/13/16 at 3 pm to discuss.  I will also forward to Dr Ardis Hughs for review.

## 2016-07-22 NOTE — Telephone Encounter (Signed)
She stopped all her medicines.  Will discuss at [facility] upcoming.

## 2016-08-07 ENCOUNTER — Other Ambulatory Visit: Payer: Self-pay | Admitting: Internal Medicine

## 2016-08-13 ENCOUNTER — Encounter: Payer: Self-pay | Admitting: Gastroenterology

## 2016-08-13 ENCOUNTER — Ambulatory Visit (INDEPENDENT_AMBULATORY_CARE_PROVIDER_SITE_OTHER): Payer: Medicare Other | Admitting: Gastroenterology

## 2016-08-13 VITALS — BP 80/66 | HR 100 | Ht 61.75 in | Wt 138.1 lb

## 2016-08-13 DIAGNOSIS — K52832 Lymphocytic colitis: Secondary | ICD-10-CM | POA: Diagnosis not present

## 2016-08-13 MED ORDER — CHOLESTYRAMINE 4 G PO PACK: 4.0000 g | PACK | Freq: Four times a day (QID) | ORAL | 6 refills | Status: DC

## 2016-08-13 NOTE — Patient Instructions (Addendum)
Take imodium 2 pills 4 times daily  Also a new trial of cholestyramine 4 gram packet 4 times daily.  Stop sulfasalazine  Call in 2-3 weeks to report on your response.  Normal BMI (Body Mass Index- based on height and weight) is between 19 and 25. Your BMI today is Body mass index is 25.47 kg/m. Marland Kitchen Please consider follow up  regarding your BMI with your Primary Care Provider.

## 2016-08-13 NOTE — Progress Notes (Signed)
Review of gastrointestinal problems:  1. Lymphocytic, microscopic colitis.Diarrhea for 10 years, eventually colonoscopy December, 2010 by Dr. Ardis Hughs. Essentially normal macroscopically however biopsies showed lymphocytic colitis.Budesonide was started With good improvement, added Imodium As well. February, 2011: taking two Entocort pills a day, for Imodium pills a day with great results. April, 2011 Off Entocort, takes 4-6 Imodium a day with great results. Seen by Nicoletta Ba in 2011, 2013. Variety of antidiarrheal agents tried. 2014 ROV, unclear if steroids ever helped per patient, trial of colestipol started. Recommended repeat colonoscopy given difficulty controlling her symptoms however cardiac issues intervened. Repeat colonoscopy 11/2012 Leveta [place],slightly erythematous mucosa in left colon; biopsies confirmed microscopic colitis again; started mesalamine orally. 1 g 3 times a day helped significantly but she still had intermittent flares. Recurrence of symptoms 02/2016; GI pathogen panel negative, started entocort 3 pills daily. Had nightmares, decreased to 2 pills daily without improvement in symptoms. 04/2016 changed to uceris again without improvement, restarted sulfasalazine 4 pills tid.  07/2016 stopped all her medicines because nothing was working.  Sulfasalazine makes her feel hungover  entocort caused nightmares and it didn't work  Imodium several times per day (alone) does not help much at all.   HPI: This is a  very pleasant 61 year old woman whom I last saw about 6 or 8 weeks ago.  Currently on  only Imodium for her bowels.   Stopped sulfasalazine and lomotil.   Still on imodium (6-8 pills per day).  She is still having 4-5 times diarrhea daily.  The other symptoms are gone (hung over feeling(  Chief complaint is lymphocytic colitis, diarrhea   ROS: complete GI ROS as described in HPI.  Constitutional:  No unintentional weight loss   Past Medical History:  Diagnosis Date  .  CARCINOMA, SQUAMOUS CELL 2009   s/p rescrtion from R nose bridge  . DEPRESSION   . Headache(784.0)   . Hx of cardiovascular stress test    Lex MV 6/14:  Normal, EF 80%  . Irritable bowel syndrome   . Lymphocytic colitis   . Microscopic colitis dx 02/2009   chronic diarrhea  . Osteoarthritis   . OSTEOPENIA   . Osteoporosis, unspecified 09/30/2008   [facility]B DEXA 05/2012: -2.7, recommended to start bisphos      Past Surgical History:  Procedure Laterality Date  . ABDOMINAL HYSTERECTOMY  1985  . APPENDECTOMY  1969  . BACK SURGERY  09/16/2014   disk replacement with fusion  . BREAST BIOPSY  1987   left  . BUNIONECTOMY Right   . FOOT SURGERY  1990   for joint in toes; bilateral  . TUBAL LIGATION      Current Outpatient Prescriptions  Medication Sig Dispense Refill  . albuterol (PROVENTIL HFA;VENTOLIN HFA) 108 (90 BASE) MCG/ACT inhaler Inhale 2 puffs into the lungs every 6 (six) hours as needed for wheezing or shortness of breath. 1 Inhaler 2  . cholecalciferol (VITAMIN D) 1000 UNITS tablet Take 1 tablet (1,000 Units total) by mouth daily.    Marland Kitchen escitalopram (LEXAPRO) 20 MG tablet TAKE 1 TABLET BY MOUTH DAILY 30 tablet 6  . folic acid (FOLVITE) 1 MG tablet Take 1 tablet (1 mg total) by mouth daily. 90 tablet 3  . imipramine (TOFRANIL) 50 MG tablet TAKE 1 TABLET (50 MG TOTAL) BY MOUTH AT BEDTIME. 90 tablet 0  . loperamide (IMODIUM) 2 MG capsule Take 2 mg by mouth 4 (four) times daily as needed for diarrhea or loose stools (pt states she takes 8-10 times a  day).     . sulfaSALAzine (AZULFIDINE) 500 MG tablet Take 2,000 mg by mouth 4 (four) times daily.     No current facility-administered medications for this visit.     Allergies as of 08/13/2016 - Review Complete 08/13/2016  Allergen Reaction Noted  . Tetracycline Other (See Comments)     Family History  Problem Relation Age of Onset  . Dementia Mother   . Coronary artery disease Mother   . Diabetes Mother   . Asthma Father   .  Ovarian cancer Sister   . Diabetes Sister   . Heart disease Sister     x   . Irritable bowel syndrome Sister   . Colon cancer Neg Hx     Social History   Social History  . Marital status: Widowed    Spouse name: N/A  . Number of children: 2  . Years of education: N/A   Occupational History  . Disabled    Social History Main Topics  . Smoking status: Current Every Day Smoker    Years: 35.00    Types: Cigarettes  . Smokeless tobacco: Never Used     Comment: Widowed form given 03-14-12  . Alcohol use No     Comment: socially  . Drug use: No  . Sexual activity: Not on file   Other Topics Concern  . Not on file   Social History Narrative   Widowed 08/24/09, 2 grown children. son in [place] and dtr in [place]     Physical Exam: BP (!) 80/66   Pulse 100   Ht 5' 1.75" (1.568 m)   Wt 138 lb 2 oz (62.7 kg)   BMI 25.47 kg/m  Constitutional: generally well-appearing Psychiatric: alert and oriented x3 Abdomen: soft, nontender, nondistended, no obvious ascites, no peritoneal signs, normal bowel sounds No peripheral edema noted in lower extremities  Assessment and plan: 61 y.o. female with Lymphocytic colitis, chronic diarrhea  Did a literature review, nice pathway in up-to-date for treatment of lymphocytic colitis. She is going to stay on Imodium taken on a scheduled basis 2 pills 4 times daily. She will also at the same time take 4 g of cholestyramine 4 times daily. She will call to report on her response in 2-3 weeks. If she does not respond favorably then I will substitute bismuth subsalicylate for her cholestyramine.  Please see the "Patient Instructions" section for addition details about the plan.  Owens Loffler, MD [facility] 08/13/2016, 2:52 PM

## 2016-08-20 ENCOUNTER — Telehealth: Payer: Self-pay | Admitting: Gastroenterology

## 2016-08-20 NOTE — Telephone Encounter (Signed)
She needs to cut back on the cholestyramine to once daily or twice daily (previously I recommended qid).    Thanks

## 2016-08-20 NOTE — Telephone Encounter (Signed)
Pt states she was placed on cholestyramine for diarrhea and now she is constipated, has not had a BM in 3 days. Please advise.

## 2016-08-20 NOTE — Telephone Encounter (Signed)
Spoke with pt and she is aware.

## 2016-10-08 ENCOUNTER — Ambulatory Visit: Payer: Medicare Other | Admitting: Nurse Practitioner

## 2016-11-04 ENCOUNTER — Telehealth: Payer: Self-pay | Admitting: Internal Medicine

## 2016-11-08 MED ORDER — IMIPRAMINE HCL 50 MG PO TABS: ORAL_TABLET | ORAL | 1 refills | Status: DC

## 2016-11-08 NOTE — Telephone Encounter (Signed)
Per office policy sent refill to [facility] inform pt must keep Sept appt for future refills

## 2016-11-08 NOTE — Telephone Encounter (Signed)
Pt made an appointment with Dr Sharlet Salina on 12/16/16. Can this be refilled or does she need to come in sooner to see someone else?

## 2016-12-16 ENCOUNTER — Ambulatory Visit (INDEPENDENT_AMBULATORY_CARE_PROVIDER_SITE_OTHER): Payer: Medicare Other | Admitting: Internal Medicine

## 2016-12-16 ENCOUNTER — Encounter: Payer: Self-pay | Admitting: Internal Medicine

## 2016-12-16 VITALS — BP 110/70 | HR 84 | Temp 98.4°F | Ht 61.75 in | Wt 136.0 lb

## 2016-12-16 DIAGNOSIS — J439 Emphysema, unspecified: Secondary | ICD-10-CM | POA: Diagnosis not present

## 2016-12-16 DIAGNOSIS — F330 Major depressive disorder, recurrent, mild: Secondary | ICD-10-CM

## 2016-12-16 DIAGNOSIS — Z23 Encounter for immunization: Secondary | ICD-10-CM | POA: Diagnosis not present

## 2016-12-16 DIAGNOSIS — Z720 Tobacco use: Secondary | ICD-10-CM | POA: Diagnosis not present

## 2016-12-16 MED ORDER — ALBUTEROL SULFATE HFA 108 (90 BASE) MCG/ACT IN AERS: 2.0000 | INHALATION_SPRAY | Freq: Four times a day (QID) | RESPIRATORY_TRACT | 2 refills | Status: DC | PRN

## 2016-12-16 NOTE — Patient Instructions (Signed)
The test name is GeneSight and we will call the company to see if we can order one. This is a test with a swab of your mouth.   If we cannot it may be worthwhile to go to the doctor that ordered the one for your grandson as they can order it and this should be covered by your insurance.   You will hear back from the office within 1 week if we are able to order or not.

## 2016-12-19 DIAGNOSIS — J449 Chronic obstructive pulmonary disease, unspecified: Secondary | ICD-10-CM | POA: Insufficient documentation

## 2016-12-19 NOTE — Assessment & Plan Note (Addendum)
Refill imipramine for her mood disorder. She does not want to switch or increase agents although her depression is worse recently without genesight which is DNA testing to see which agent would work best as she has had side effects with many medications in the past.

## 2016-12-19 NOTE — Assessment & Plan Note (Signed)
Refill albuterol and she denies much change in QOL due to breathing. She is still smoking and we talked about how this will worsen her breathing in time.

## 2016-12-19 NOTE — Progress Notes (Signed)
   Subjective:    Patient ID: Monique Swanson, female    DOB: Jul 30, 1955, 61 y.o.   MRN: [id number]  HPI The patient is a 61 YO female coming in for follow up of her mood disorder (imipramine still controlling her symptoms, slightly worse with her bowel problem worsened, no side effects, denies SI/HI, hard to leave the house with her bowel problem which worsens her depression), and her likely COPD (tobacco abuse, some mild SOB with activity and uses albuterol rarely, she needs refill on albuterol) and tobacco abuse (no intention of quitting now, is aware of health problems associated with smoking, she has tried to quit in the past).   Review of Systems  Constitutional: Negative.   HENT: Negative.   Eyes: Negative.   Respiratory: Positive for shortness of breath. Negative for cough and chest tightness.   Cardiovascular: Negative for chest pain, palpitations and leg swelling.  Gastrointestinal: Negative for abdominal distention, abdominal pain, constipation, diarrhea, nausea and vomiting.  Musculoskeletal: Negative.   Skin: Negative.   Neurological: Negative.   Psychiatric/Behavioral: Positive for decreased concentration and dysphoric mood. Negative for agitation, behavioral problems, confusion, self-injury, sleep disturbance and suicidal ideas. The patient is not nervous/anxious.       Objective:   Physical Exam  Constitutional: She is oriented to person, place, and time. She appears well-developed and well-nourished.  HENT:  Head: Normocephalic and atraumatic.  Eyes: EOM are normal.  Neck: Normal range of motion.  Cardiovascular: Normal rate and regular rhythm.   Pulmonary/Chest: Effort normal and breath sounds normal. No respiratory distress. She has no wheezes. She has no rales.  Abdominal: Soft. Bowel sounds are normal. She exhibits no distension. There is no tenderness. There is no rebound.  Musculoskeletal: She exhibits no edema.  Neurological: She is alert and oriented to person,  place, and time. Coordination normal.  Skin: Skin is warm and dry.  Psychiatric: She has a normal mood and affect.  Some flat affect   Vitals:   12/16/16 1417  BP: 110/70  Pulse: 84  Temp: 98.4 F (36.9 C)  TempSrc: Oral  SpO2: 98%  Weight: 136 lb (61.7 kg)  Height: 5' 1.75" (1.568 m)      Assessment & Plan:  Flu shot given at visit.

## 2016-12-19 NOTE — Assessment & Plan Note (Signed)
She is still smoking and has no intention to quit right now, has tried quitting in the past. Some likely COPD symptoms and reminded about the risks and harms of continued smoking.

## 2017-01-01 ENCOUNTER — Other Ambulatory Visit: Payer: Self-pay | Admitting: Internal Medicine

## 2017-01-05 ENCOUNTER — Encounter: Payer: Self-pay | Admitting: Internal Medicine

## 2017-01-05 DIAGNOSIS — K51919 Ulcerative colitis, unspecified with unspecified complications: Secondary | ICD-10-CM

## 2017-01-24 DIAGNOSIS — K5289 Other specified noninfective gastroenteritis and colitis: Secondary | ICD-10-CM | POA: Diagnosis not present

## 2017-01-24 DIAGNOSIS — R109 Unspecified abdominal pain: Secondary | ICD-10-CM | POA: Diagnosis not present

## 2017-01-24 DIAGNOSIS — K52832 Lymphocytic colitis: Secondary | ICD-10-CM | POA: Diagnosis not present

## 2017-01-26 ENCOUNTER — Other Ambulatory Visit: Payer: Self-pay | Admitting: Internal Medicine

## 2017-01-27 LAB — TSH: TSH: 3.08 (ref 0.41–5.90)

## 2017-02-03 ENCOUNTER — Encounter: Payer: Self-pay | Admitting: Internal Medicine

## 2017-02-15 DIAGNOSIS — L03213 Periorbital cellulitis: Secondary | ICD-10-CM | POA: Diagnosis not present

## 2017-03-09 DIAGNOSIS — R109 Unspecified abdominal pain: Secondary | ICD-10-CM | POA: Diagnosis not present

## 2017-03-09 DIAGNOSIS — Z791 Long term (current) use of non-steroidal anti-inflammatories (NSAID): Secondary | ICD-10-CM | POA: Diagnosis not present

## 2017-03-09 DIAGNOSIS — K52832 Lymphocytic colitis: Secondary | ICD-10-CM | POA: Diagnosis not present

## 2017-06-02 DIAGNOSIS — K529 Noninfective gastroenteritis and colitis, unspecified: Secondary | ICD-10-CM | POA: Diagnosis not present

## 2017-06-02 DIAGNOSIS — K52832 Lymphocytic colitis: Secondary | ICD-10-CM | POA: Diagnosis not present

## 2017-06-14 DIAGNOSIS — M25562 Pain in left knee: Secondary | ICD-10-CM | POA: Diagnosis not present

## 2017-06-21 ENCOUNTER — Encounter (HOSPITAL_COMMUNITY): Payer: Self-pay

## 2017-06-21 ENCOUNTER — Emergency Department (HOSPITAL_COMMUNITY): Payer: Medicare Other

## 2017-06-21 ENCOUNTER — Emergency Department (HOSPITAL_COMMUNITY)
Admission: EM | Admit: 2017-06-21 | Discharge: 2017-06-21 | Disposition: A | Discharge status: Home / Self Care | Payer: Medicare Other | Attending: Emergency Medicine | Admitting: Emergency Medicine

## 2017-06-21 ENCOUNTER — Other Ambulatory Visit: Payer: Self-pay

## 2017-06-21 DIAGNOSIS — M25562 Pain in left knee: Secondary | ICD-10-CM | POA: Diagnosis not present

## 2017-06-21 DIAGNOSIS — Y999 Unspecified external cause status: Secondary | ICD-10-CM | POA: Insufficient documentation

## 2017-06-21 DIAGNOSIS — F17210 Nicotine dependence, cigarettes, uncomplicated: Secondary | ICD-10-CM | POA: Insufficient documentation

## 2017-06-21 DIAGNOSIS — J449 Chronic obstructive pulmonary disease, unspecified: Secondary | ICD-10-CM | POA: Insufficient documentation

## 2017-06-21 DIAGNOSIS — Y939 Activity, unspecified: Secondary | ICD-10-CM | POA: Insufficient documentation

## 2017-06-21 DIAGNOSIS — W208XXA Other cause of strike by thrown, projected or falling object, initial encounter: Secondary | ICD-10-CM | POA: Diagnosis not present

## 2017-06-21 DIAGNOSIS — Z79899 Other long term (current) drug therapy: Secondary | ICD-10-CM | POA: Diagnosis not present

## 2017-06-21 DIAGNOSIS — S8992XA Unspecified injury of left lower leg, initial encounter: Secondary | ICD-10-CM | POA: Diagnosis not present

## 2017-06-21 DIAGNOSIS — Y929 Unspecified place or not applicable: Secondary | ICD-10-CM | POA: Insufficient documentation

## 2017-06-21 DIAGNOSIS — M25561 Pain in right knee: Secondary | ICD-10-CM | POA: Diagnosis not present

## 2017-06-21 DIAGNOSIS — S8991XA Unspecified injury of right lower leg, initial encounter: Secondary | ICD-10-CM | POA: Diagnosis present

## 2017-06-21 MED ORDER — DICLOFENAC SODIUM 1 % TD GEL: 4.0000 g | Freq: Four times a day (QID) | TRANSDERMAL | 0 refills | Status: DC

## 2017-06-21 NOTE — Discharge Instructions (Signed)
You have been seen today for a knee injury. There were no acute abnormalities on the x-rays, including no sign of fracture or dislocation, however, there could be injuries to the soft tissues, such as the ligaments or tendons that are not seen on xrays.  Tylenol: Tylenol may be taken as needed for pain.  Your daily total maximum amount of tylenol from all sources should be limited to 4000mg /day for persons without liver problems, or 2000mg /day for those with liver problems. Diclofenac: May apply the diclofenac gel up to 4 times a day, as needed, to the painful area. Ice: May apply ice to the area over the next 24 hours for 15 minutes at a time to reduce swelling. Elevation: Keep the extremity elevated as often as possible to reduce pain and inflammation. Support: Wear the knee brace for support and comfort. Wear this until pain resolves. You will be weight-bearing as tolerated, which means you can slowly start to put weight on the extremity and increase amount and frequency as pain allows. Exercises: Start by performing these exercises a few times a week, increasing the frequency until you are performing them twice daily.  Follow up: Follow-up with the orthopedic specialist on this matter.

## 2017-06-21 NOTE — ED Provider Notes (Signed)
[facility] DEPT Provider Note   CSN: [id number] Arrival date & time: 06/21/17  3976     History   Chief Complaint Chief Complaint  Patient presents with  . Knee Pain    HPI Monique Swanson is a 62 y.o. female.  HPI   Monique Swanson is a 62 y.o. female, with a history of osteopenia and osteoporosis, presenting to the ED with left knee pain worsening for last three weeks.  Moving a heavy chair, it started to fall, and chair dropped on left knee. Pain is mostly medial, 6/10, throbbing, nonradiating. Has been using ice, tylenol, and a knee brace. States sometimes when she is walking, the knee "gives out and is loose." Pain worsens after long walks or twisting motions.   Denies weakness, numbness, color change, fever/chills, recent procedures to the knee, or any other complaints.     Past Medical History:  Diagnosis Date  . CARCINOMA, SQUAMOUS CELL 2009   s/p rescrtion from R nose bridge  . DEPRESSION   . Headache(784.0)   . Hx of cardiovascular stress test    Lex MV 6/14:  Normal, EF 80%  . Irritable bowel syndrome   . Lymphocytic colitis   . Microscopic colitis dx 02/2009   chronic diarrhea  . Osteoarthritis   . OSTEOPENIA   . Osteoporosis, unspecified 09/30/2008   LeB DEXA 05/2012: -2.7, recommended to start bisphos      Patient Active Problem List   Diagnosis Date Noted  . COPD (chronic obstructive pulmonary disease) ([place]) 12/19/2016  . Medicare annual wellness visit, subsequent 03/18/2016  . Routine general medical examination at a health care facility 02/25/2015  . Lymphocytic colitis 07/31/2012  . Tobacco abuse   . DEFICIENCY OF OTHER VITAMINS 09/30/2008  . Osteoporosis, unspecified 09/30/2008  . CARCINOMA, SQUAMOUS CELL 07/16/2008  . Major depressive disorder, recurrent episode ([place]) 07/16/2008  . ARTHRITIS 07/16/2008  . HEADACHE 07/16/2008    Past Surgical History:  Procedure Laterality Date  . ABDOMINAL HYSTERECTOMY  1985    . APPENDECTOMY  1969  . BACK SURGERY  09/16/2014   disk replacement with fusion  . BREAST BIOPSY  1987   left  . BUNIONECTOMY Right   . FOOT SURGERY  1990   for joint in toes; bilateral  . TUBAL LIGATION      OB History    No data available       Home Medications    Prior to Admission medications   Medication Sig Start Date End Date Taking? Authorizing Provider  albuterol (PROVENTIL HFA;VENTOLIN HFA) 108 (90 Base) MCG/ACT inhaler Inhale 2 puffs into the lungs every 6 (six) hours as needed for wheezing or shortness of breath. 12/16/16   Hoyt Koch, MD  cholecalciferol (VITAMIN D) 1000 UNITS tablet Take 1 tablet (1,000 Units total) by mouth daily. 05/18/12   Rowe Clack, MD  cholestyramine Lucrezia Starch) 4 g packet Take 1 packet (4 g total) by mouth 4 (four) times daily. 08/13/16 09/12/16  Milus Banister, MD  diclofenac sodium (VOLTAREN) 1 % GEL Apply 4 g topically 4 (four) times daily. 06/21/17   Elex Mainwaring C, PA-C  escitalopram (LEXAPRO) 20 MG tablet TAKE 1 TABLET BY MOUTH DAILY 01/26/17   Hoyt Koch, MD  folic acid (FOLVITE) 1 MG tablet Take 1 tablet (1 mg total) by mouth daily. 02/13/15   Milus Banister, MD  imipramine (TOFRANIL) 50 MG tablet TAKE 1 TABLET BY MOUTH EVERYDAY AT BEDTIME 01/03/17   Pricilla Holm  A, MD  loperamide (IMODIUM) 2 MG capsule Take 2 mg by mouth 4 (four) times daily as needed for diarrhea or loose stools (pt states she takes 8-10 times a day).     [provider]  ranitidine (ZANTAC) 150 MG tablet Take 150 mg by mouth as needed for heartburn.    [provider]    Family History Family History  Problem Relation Age of Onset  . Dementia Mother   . Coronary artery disease Mother   . Diabetes Mother   . Asthma Father   . Ovarian cancer Sister   . Diabetes Sister   . Heart disease Sister        x   . Irritable bowel syndrome Sister   . Colon cancer Neg Hx     Social History Social History   Tobacco Use   . Smoking status: Current Every Day Smoker    Packs/day: 0.50    Years: 35.00    Pack years: 17.50    Types: Cigarettes  . Smokeless tobacco: Never Used  . Tobacco comment: Widowed form given 03-14-12  Substance Use Topics  . Alcohol use: No    Comment: socially  . Drug use: No     Allergies   Tetracycline   Review of Systems Review of Systems  Constitutional: Negative for fever.  Musculoskeletal: Positive for arthralgias. Negative for joint swelling.  Neurological: Negative for weakness and numbness.     Physical Exam Updated Vital Signs BP 105/68   Pulse 75   Temp 98.1 F (36.7 C)   Resp 17   Ht 5\' 2"  (1.575 m)   Wt 60.3 kg (133 lb)   SpO2 100%   BMI 24.33 kg/m   Physical Exam  Constitutional: She appears well-developed and well-nourished. No distress.  HENT:  Head: Normocephalic and atraumatic.  Eyes: Conjunctivae are normal.  Neck: Neck supple.  Cardiovascular: Normal rate, regular rhythm and intact distal pulses.  Pulmonary/Chest: Effort normal.  Musculoskeletal: She exhibits tenderness. She exhibits no edema or deformity.  Tenderness to left medial knee.  No noted swelling, erythema, laxity, crepitus, increased warmth, or deformity.  Pain to the medial knee with varus stress. Full passive and active range of motion in the left knee.  Pain with extremes of flexion and extension of left knee. Limping gait.   Neurological: She is alert.  No noted acute sensory deficits. Strength 5/5 with flexion and extension in the left hip, knee, and ankle.  Skin: Skin is warm and dry. Capillary refill takes less than 2 seconds. She is not diaphoretic. No pallor.  Psychiatric: She has a normal mood and affect. Her behavior is normal.  Nursing note and vitals reviewed.    ED Treatments / Results  Labs (all labs ordered are listed, but only abnormal results are displayed) Labs Reviewed - No data to display  EKG  EKG Interpretation None       Radiology Dg  Knee Complete 4 Views Left  Result Date: 06/21/2017 CLINICAL DATA:  Injury 3 weeks ago. EXAM: LEFT KNEE - COMPLETE 4+ VIEW COMPARISON:  No recent. FINDINGS: Diffuse osteopenia. Degenerative changes left knee. No acute abnormality identified. No evidence of fracture. Tiny effusion may be present. IMPRESSION: Diffuse osteopenia and degenerative change. No evidence of fracture. Tiny effusion may be present. Electronically Signed   By: Marcello Moores  Register   On: 06/21/2017 11:01    Procedures Procedures (including critical care time)  Medications Ordered in ED Medications - No data to display  Initial Impression / Assessment and Plan / ED Course  I have reviewed the triage vital signs and the nursing notes.  Pertinent labs & imaging results that were available during my care of the patient were reviewed by me and considered in my medical decision making (see chart for details).     Patient presents with left knee pain for the past 3 weeks.  Doubt septic joint based on exam and history.  Meniscal injury is high on the differential.  Orthopedic follow-up. The patient was given instructions for home care as well as return precautions. Patient voices understanding of these instructions, accepts the plan, and is comfortable with discharge.   Final Clinical Impressions(s) / ED Diagnoses   Final diagnoses:  Acute pain of left knee    ED Discharge Orders        Ordered    diclofenac sodium (VOLTAREN) 1 % GEL  4 times daily     06/21/17 1331       Lorayne Bender, PA-C 06/21/17 1331    Cardama, Grayce Sessions, MD 06/22/17 [phone]

## 2017-06-21 NOTE — ED Triage Notes (Signed)
Patient states she was moving an office chair and she used her left knee to help her lift the chair 3 weeks ago. Patient c/o left knee pain and slight left knee swelling.

## 2017-06-30 ENCOUNTER — Encounter (INDEPENDENT_AMBULATORY_CARE_PROVIDER_SITE_OTHER): Payer: Self-pay | Admitting: Orthopaedic Surgery

## 2017-06-30 ENCOUNTER — Ambulatory Visit (INDEPENDENT_AMBULATORY_CARE_PROVIDER_SITE_OTHER): Payer: Medicare Other | Admitting: Orthopaedic Surgery

## 2017-06-30 DIAGNOSIS — G8929 Other chronic pain: Secondary | ICD-10-CM

## 2017-06-30 DIAGNOSIS — M25562 Pain in left knee: Secondary | ICD-10-CM

## 2017-06-30 MED ORDER — LIDOCAINE HCL 1 % IJ SOLN
2.0000 mL | INTRAMUSCULAR | Status: AC | PRN
  Administered 2017-06-30: 2 mL

## 2017-06-30 MED ORDER — BUPIVACAINE HCL 0.5 % IJ SOLN
2.0000 mL | INTRAMUSCULAR | Status: AC | PRN
  Administered 2017-06-30: 2 mL via INTRA_ARTICULAR

## 2017-06-30 MED ORDER — METHYLPREDNISOLONE ACETATE 40 MG/ML IJ SUSP
40.0000 mg | INTRAMUSCULAR | Status: AC | PRN
  Administered 2017-06-30: 40 mg via INTRA_ARTICULAR

## 2017-06-30 NOTE — Progress Notes (Signed)
Office Visit Note   Patient: Monique Swanson           Date of Birth: 1955/09/11           MRN: [id number] Visit Date: 06/30/2017              Requested by: Hoyt Koch, MD [place], [place] [id number] PCP: Hoyt Koch, MD   Assessment & Plan: Visit Diagnoses:  1. Chronic pain of left knee     Plan: Impression is left knee pain suspect contusion versus OA flareup versus medial meniscal tear.  Cortisone injection performed today.  Patient instructed to follow-up if no improvement over the next 2-4 weeks or so.  Questions encouraged and answered.  Follow-Up Instructions: Return if symptoms worsen or fail to improve.   Orders:  No orders of the defined types were placed in this encounter.  No orders of the defined types were placed in this encounter.     Procedures: Large Joint Inj: L knee on 06/30/2017 10:53 AM Details: 22 G needle Medications: 2 mL bupivacaine 0.5 %; 2 mL lidocaine 1 %; 40 mg methylPREDNISolone acetate 40 MG/ML Outcome: tolerated well, no immediate complications Patient was prepped and draped in the usual sterile fashion.       Clinical Data: No additional findings.   Subjective: Chief Complaint  Patient presents with  . Left Knee - Pain, New Patient (Initial Visit)    Patient is a 62 year old female comes in with left knee pain since 06/21/2017.  She states that she did have some chronic achy pain in her left knee which was made worse by dropping a desk chair on it.  She endorses some swelling and tightness.  Pain does not radiate.  Denies any numbness and tingling.  She denies any mechanical symptoms.  Voltaren gel does not give any significant relief.   Review of Systems  Constitutional: Negative.   HENT: Negative.   Eyes: Negative.   Respiratory: Negative.   Cardiovascular: Negative.   Endocrine: Negative.   Musculoskeletal: Negative.   Neurological: Negative.   Hematological: Negative.     Psychiatric/Behavioral: Negative.   All other systems reviewed and are negative.    Objective: Vital Signs: There were no vitals taken for this visit.  Physical Exam  Constitutional: She is oriented to person, place, and time. She appears well-developed and well-nourished.  Pulmonary/Chest: Effort normal.  Neurological: She is alert and oriented to person, place, and time.  Skin: Skin is warm. Capillary refill takes less than 2 seconds.  Psychiatric: She has a normal mood and affect. Her behavior is normal. Judgment and thought content normal.  Nursing note and vitals reviewed.   Ortho Exam Left knee exam shows a trace effusion.  Collaterals and cruciates are stable.  Medial joint line tenderness.  Pes bursa is nontender. Specialty Comments:  No specialty comments available.  Imaging: No results found.   PMFS History: Patient Active Problem List   Diagnosis Date Noted  . COPD (chronic obstructive pulmonary disease) ([place]) 12/19/2016  . Medicare annual wellness visit, subsequent 03/18/2016  . Routine general medical examination at a health care facility 02/25/2015  . Lymphocytic colitis 07/31/2012  . Tobacco abuse   . DEFICIENCY OF OTHER VITAMINS 09/30/2008  . Osteoporosis, unspecified 09/30/2008  . CARCINOMA, SQUAMOUS CELL 07/16/2008  . Major depressive disorder, recurrent episode ([place]) 07/16/2008  . ARTHRITIS 07/16/2008  . HEADACHE 07/16/2008   Past Medical History:  Diagnosis Date  . CARCINOMA, SQUAMOUS CELL 2009  s/p rescrtion from R nose bridge  . DEPRESSION   . Headache(784.0)   . Hx of cardiovascular stress test    Lex MV 6/14:  Normal, EF 80%  . Irritable bowel syndrome   . Lymphocytic colitis   . Microscopic colitis dx 02/2009   chronic diarrhea  . Osteoarthritis   . OSTEOPENIA   . Osteoporosis, unspecified 09/30/2008   LeB DEXA 05/2012: -2.7, recommended to start bisphos      Family History  Problem Relation Age of Onset  . Dementia Mother   .  Coronary artery disease Mother   . Diabetes Mother   . Asthma Father   . Ovarian cancer Sister   . Diabetes Sister   . Heart disease Sister        x   . Irritable bowel syndrome Sister   . Colon cancer Neg Hx     Past Surgical History:  Procedure Laterality Date  . ABDOMINAL HYSTERECTOMY  1985  . APPENDECTOMY  1969  . BACK SURGERY  09/16/2014   disk replacement with fusion  . BREAST BIOPSY  1987   left  . BUNIONECTOMY Right   . FOOT SURGERY  1990   for joint in toes; bilateral  . TUBAL LIGATION     Social History   Occupational History  . Occupation: Disabled  Tobacco Use  . Smoking status: Current Every Day Smoker    Packs/day: 0.50    Years: 35.00    Pack years: 17.50    Types: Cigarettes  . Smokeless tobacco: Never Used  . Tobacco comment: Widowed form given 03-14-12  Substance and Sexual Activity  . Alcohol use: No    Comment: socially  . Drug use: No  . Sexual activity: Not on file

## 2017-08-02 DIAGNOSIS — K52832 Lymphocytic colitis: Secondary | ICD-10-CM | POA: Diagnosis not present

## 2017-08-02 DIAGNOSIS — K529 Noninfective gastroenteritis and colitis, unspecified: Secondary | ICD-10-CM | POA: Diagnosis not present

## 2017-08-15 ENCOUNTER — Other Ambulatory Visit: Payer: Self-pay | Admitting: Internal Medicine

## 2017-08-23 NOTE — Progress Notes (Deleted)
Subjective:   Monique Swanson is a 62 y.o. female who presents for Medicare Annual (Subsequent) preventive examination.  Review of Systems:  No ROS.  Medicare Wellness Visit. Additional risk factors are reflected in the social history.    Sleep patterns: {SX; SLEEP PATTERNS:18802::"feels rested on waking","does not get up to void","gets up *** times nightly to void","sleeps *** hours nightly"}.    Home Safety/Smoke Alarms: Feels safe in home. Smoke alarms in place.  Living environment; residence and Firearm Safety: {Rehab home environment / accessibility:30080::"no firearms","firearms stored safely"}. Seat Belt Safety/Bike Helmet: Wears seat belt.     Objective:     Vitals: There were no vitals taken for this visit.  There is no height or weight on file to calculate BMI.  Advanced Directives 06/21/2017 03/18/2016 07/31/2014  Does Patient Have a Medical Advance Directive? No No No  Would patient like information on creating a medical advance directive? No - Patient declined No - Patient declined No - patient declined information    Tobacco Social History   Tobacco Use  Smoking Status Current Every Day Smoker  . Packs/day: 0.50  . Years: 35.00  . Pack years: 17.50  . Types: Cigarettes  Smokeless Tobacco Never Used  Tobacco Comment   Widowed form given 03-14-12     Ready to quit: Not Answered Counseling given: Not Answered Comment: Widowed form given 03-14-12  Past Medical History:  Diagnosis Date  . CARCINOMA, SQUAMOUS CELL 2009   s/p rescrtion from R nose bridge  . DEPRESSION   . Headache(784.0)   . Hx of cardiovascular stress test    Lex MV 6/14:  Normal, EF 80%  . Irritable bowel syndrome   . Lymphocytic colitis   . Microscopic colitis dx 02/2009   chronic diarrhea  . Osteoarthritis   . OSTEOPENIA   . Osteoporosis, unspecified 09/30/2008   LeB DEXA 05/2012: -2.7, recommended to start bisphos     Past Surgical History:  Procedure Laterality Date  . ABDOMINAL  HYSTERECTOMY  1985  . APPENDECTOMY  1969  . BACK SURGERY  09/16/2014   disk replacement with fusion  . BREAST BIOPSY  1987   left  . BUNIONECTOMY Right   . FOOT SURGERY  1990   for joint in toes; bilateral  . TUBAL LIGATION     Family History  Problem Relation Age of Onset  . Dementia Mother   . Coronary artery disease Mother   . Diabetes Mother   . Asthma Father   . Ovarian cancer Sister   . Diabetes Sister   . Heart disease Sister        x   . Irritable bowel syndrome Sister   . Colon cancer Neg Hx    Social History   Socioeconomic History  . Marital status: Widowed    Spouse name: Not on file  . Number of children: 2  . Years of education: Not on file  . Highest education level: Not on file  Occupational History  . Occupation: Disabled  Social Needs  . Financial resource strain: Not on file  . Food insecurity:    Worry: Not on file    Inability: Not on file  . Transportation needs:    Medical: Not on file    Non-medical: Not on file  Tobacco Use  . Smoking status: Current Every Day Smoker    Packs/day: 0.50    Years: 35.00    Pack years: 17.50    Types: Cigarettes  . Smokeless tobacco: Never  Used  . Tobacco comment: Widowed form given 03-14-12  Substance and Sexual Activity  . Alcohol use: No    Comment: socially  . Drug use: No  . Sexual activity: Not on file  Lifestyle  . Physical activity:    Days per week: Not on file    Minutes per session: Not on file  . Stress: Not on file  Relationships  . Social connections:    Talks on phone: Not on file    Gets together: Not on file    Attends religious service: Not on file    Active member of club or organization: Not on file    Attends meetings of clubs or organizations: Not on file    Relationship status: Not on file  Other Topics Concern  . Not on file  Social History Narrative   Widowed 08/24/09, 2 grown children. son in [place] and dtr in [place]    Outpatient Encounter Medications as of  08/24/2017  Medication Sig  . albuterol (PROVENTIL HFA;VENTOLIN HFA) 108 (90 Base) MCG/ACT inhaler Inhale 2 puffs into the lungs every 6 (six) hours as needed for wheezing or shortness of breath.  . cholecalciferol (VITAMIN D) 1000 UNITS tablet Take 1 tablet (1,000 Units total) by mouth daily.  . cholestyramine (QUESTRAN) 4 g packet Take 1 packet (4 g total) by mouth 4 (four) times daily.  . diclofenac sodium (VOLTAREN) 1 % GEL Apply 4 g topically 4 (four) times daily.  Marland Kitchen escitalopram (LEXAPRO) 20 MG tablet TAKE 1 TABLET BY MOUTH DAILY  . folic acid (FOLVITE) 1 MG tablet Take 1 tablet (1 mg total) by mouth daily.  Marland Kitchen imipramine (TOFRANIL) 50 MG tablet TAKE 1 TABLET BY MOUTH EVERYDAY AT BEDTIME  . loperamide (IMODIUM) 2 MG capsule Take 2 mg by mouth 4 (four) times daily as needed for diarrhea or loose stools (pt states she takes 8-10 times a day).   . ranitidine (ZANTAC) 150 MG tablet Take 150 mg by mouth as needed for heartburn.   No facility-administered encounter medications on file as of 08/24/2017.     Activities of Daily Living No flowsheet data found.  Patient Care Team: Hoyt Koch, MD as PCP - General (Internal Medicine) Milus Banister, MD (Gastroenterology)    Assessment:   This is a routine wellness examination for Monique Swanson. Physical assessment deferred to PCP.   Exercise Activities and Dietary recommendations   Diet (meal preparation, eat out, water intake, caffeinated beverages, dairy products, fruits and vegetables): {Desc; diets:16563}    Goals    None      Fall Risk Fall Risk  03/18/2016  Falls in the past year? Yes  Number falls in past yr: 2 or more  Injury with Fall? No  Risk for fall due to : Impaired balance/gait  Follow up Falls prevention discussed   Depression Screen No flowsheet data found.   Cognitive Function        Immunization History  Administered Date(s) Administered  . Influenza,inj,Quad PF,6+ Mos 03/18/2016, 12/16/2016  .  Tdap 09/11/2014    Screening Tests Health Maintenance  Topic Date Due  . HIV Screening  10/08/1970  . MAMMOGRAM  08/26/2017  . INFLUENZA VACCINE  11/10/2017  . COLONOSCOPY  11/15/2022  . TETANUS/TDAP  09/10/2024  . Hepatitis C Screening  Completed      Plan:   I have personally reviewed and noted the following in the patient's chart:   . Medical and social history . Use of alcohol, tobacco or  illicit drugs  . Current medications and supplements . Functional ability and status . Nutritional status . Physical activity . Advanced directives . List of other physicians . Vitals . Screenings to include cognitive, depression, and falls . Referrals and appointments  In addition, I have reviewed and discussed with patient certain preventive protocols, quality metrics, and best practice recommendations. A written personalized care plan for preventive services as well as general preventive health recommendations were provided to patient.     Michiel Cowboy, RN  08/23/2017

## 2017-08-24 ENCOUNTER — Ambulatory Visit: Payer: Medicare Other

## 2017-09-12 ENCOUNTER — Telehealth: Payer: Self-pay | Admitting: Emergency Medicine

## 2017-09-12 NOTE — Telephone Encounter (Signed)
Called patient to schedule AWV. Patient will call back to reschedule at later date.

## 2017-10-21 ENCOUNTER — Telehealth: Payer: Self-pay | Admitting: Internal Medicine

## 2017-10-21 NOTE — Telephone Encounter (Signed)
Attempted to call patient to schedule AWV. Patient did not answer. Will try to call patient again at a later time. SF

## 2017-12-28 ENCOUNTER — Other Ambulatory Visit: Payer: Self-pay | Admitting: Internal Medicine

## 2017-12-31 ENCOUNTER — Other Ambulatory Visit: Payer: Self-pay | Admitting: Internal Medicine

## 2018-01-09 ENCOUNTER — Other Ambulatory Visit: Payer: Self-pay | Admitting: Gastroenterology

## 2018-01-11 ENCOUNTER — Other Ambulatory Visit: Payer: Self-pay | Admitting: Internal Medicine

## 2018-01-12 ENCOUNTER — Ambulatory Visit: Payer: Medicare Other | Admitting: Internal Medicine

## 2018-01-22 ENCOUNTER — Other Ambulatory Visit: Payer: Self-pay | Admitting: Internal Medicine

## 2018-01-24 ENCOUNTER — Telehealth: Payer: Self-pay | Admitting: Internal Medicine

## 2018-01-24 NOTE — Telephone Encounter (Signed)
Spoke with Mrs. Monique Swanson regarding AWV and patient had a question about her medication refill. She would like to know if her medication escitalopram 20 mg has been refilled. She stated that she is completely out of this medication. SF

## 2018-01-24 NOTE — Telephone Encounter (Signed)
Patient needs an appointment with Dr. Sharlet Salina before any medication can be sent in. Patient has not been seen in a year

## 2018-01-25 ENCOUNTER — Other Ambulatory Visit: Payer: Self-pay | Admitting: Internal Medicine

## 2018-01-26 ENCOUNTER — Encounter: Payer: Self-pay | Admitting: Internal Medicine

## 2018-01-26 ENCOUNTER — Ambulatory Visit (INDEPENDENT_AMBULATORY_CARE_PROVIDER_SITE_OTHER): Payer: Medicare Other | Admitting: Internal Medicine

## 2018-01-26 ENCOUNTER — Other Ambulatory Visit (INDEPENDENT_AMBULATORY_CARE_PROVIDER_SITE_OTHER): Payer: Medicare Other

## 2018-01-26 VITALS — BP 110/80 | HR 82 | Temp 97.6°F | Ht 62.0 in | Wt 134.0 lb

## 2018-01-26 DIAGNOSIS — J441 Chronic obstructive pulmonary disease with (acute) exacerbation: Secondary | ICD-10-CM

## 2018-01-26 DIAGNOSIS — J410 Simple chronic bronchitis: Secondary | ICD-10-CM

## 2018-01-26 DIAGNOSIS — F331 Major depressive disorder, recurrent, moderate: Secondary | ICD-10-CM | POA: Diagnosis not present

## 2018-01-26 DIAGNOSIS — K52832 Lymphocytic colitis: Secondary | ICD-10-CM | POA: Diagnosis not present

## 2018-01-26 DIAGNOSIS — F17210 Nicotine dependence, cigarettes, uncomplicated: Secondary | ICD-10-CM | POA: Diagnosis not present

## 2018-01-26 DIAGNOSIS — Z13220 Encounter for screening for lipoid disorders: Secondary | ICD-10-CM

## 2018-01-26 LAB — CBC
HCT: 47.3 % — ABNORMAL HIGH (ref 36.0–46.0)
Hemoglobin: 16 g/dL — ABNORMAL HIGH (ref 12.0–15.0)
MCHC: 33.9 g/dL (ref 30.0–36.0)
MCV: 94.8 fl (ref 78.0–100.0)
Platelets: 359 10*3/uL (ref 150.0–400.0)
RBC: 4.99 Mil/uL (ref 3.87–5.11)
RDW: 14.7 % (ref 11.5–15.5)
WBC: 10 10*3/uL (ref 4.0–10.5)

## 2018-01-26 LAB — COMPREHENSIVE METABOLIC PANEL
ALT: 13 U/L (ref 0–35)
AST: 14 U/L (ref 0–37)
Albumin: 4.4 g/dL (ref 3.5–5.2)
Alkaline Phosphatase: 85 U/L (ref 39–117)
BUN: 15 mg/dL (ref 6–23)
CO2: 27 mEq/L (ref 19–32)
Calcium: 10.1 mg/dL (ref 8.4–10.5)
Chloride: 100 mEq/L (ref 96–112)
Creatinine, Ser: 1.02 mg/dL (ref 0.40–1.20)
GFR: 58.3 mL/min — ABNORMAL LOW (ref >60.00)
Glucose, Bld: 96 mg/dL (ref 70–99)
Potassium: 4.4 mEq/L (ref 3.5–5.1)
Sodium: 138 mEq/L (ref 135–145)
Total Bilirubin: 0.3 mg/dL (ref 0.2–1.2)
Total Protein: 7.6 g/dL (ref 6.0–8.3)

## 2018-01-26 LAB — LIPID PANEL
Cholesterol: 236 mg/dL — ABNORMAL HIGH (ref 0–200)
HDL: 83.5 mg/dL (ref >39.00)
LDL Cholesterol: 124 mg/dL — ABNORMAL HIGH (ref 0–99)
NonHDL: 152.67
Total CHOL/HDL Ratio: 3
Triglycerides: 145 mg/dL (ref 0.0–149.0)
VLDL: 29 mg/dL (ref 0.0–40.0)

## 2018-01-26 MED ORDER — PREDNISONE 20 MG PO TABS: 40.0000 mg | ORAL_TABLET | Freq: Every day | ORAL | 0 refills | Status: AC

## 2018-01-26 MED ORDER — IMIPRAMINE HCL 50 MG PO TABS: 50.0000 mg | ORAL_TABLET | Freq: Every day | ORAL | 3 refills | Status: DC

## 2018-01-26 MED ORDER — AMOXICILLIN-POT CLAVULANATE 875-125 MG PO TABS: 1.0000 | ORAL_TABLET | Freq: Two times a day (BID) | ORAL | 0 refills | Status: AC

## 2018-01-26 MED ORDER — RANITIDINE HCL 150 MG PO TABS: 150.0000 mg | ORAL_TABLET | ORAL | 3 refills | Status: DC | PRN

## 2018-01-26 MED ORDER — ESCITALOPRAM OXALATE 20 MG PO TABS: 20.0000 mg | ORAL_TABLET | Freq: Every day | ORAL | 3 refills | Status: DC

## 2018-01-26 NOTE — Assessment & Plan Note (Signed)
Time spent counseling about tobacco usage: 4 minutes. I have asked about smoking and is smoking same as usual. The patient is advised to quit. The patient is not willing to quit. They would like to try to quit in the next 6-12 months. We will follow up with them in 6-12 months.

## 2018-01-26 NOTE — Assessment & Plan Note (Signed)
With flare today, rx for augmentin and prednisone. She is still smoking and was counseled about the clear harm to her health. She is using albuterol prn. Talked to her about maintenance inhaler but since she normally does not have problems outside flares she declines.

## 2018-01-26 NOTE — Progress Notes (Signed)
   Subjective:    Patient ID: Monique Swanson, female    DOB: [birth year], 62 y.o.   MRN: [id number]  HPI The patient is a 62 YO female coming in for several concerns including depression (worse currently due to being out of lexapro for about 1-2 weeks, still taking imipramine, denies SI/HI, is forced to go out of the house as she is driving her daughter and caring for her daughter's two children while she works, is very depressed, some anxiety, lack of motivation) and cough (going on for a month, some SOB, coughing up white sputum, mild nose drainage, has cough normally from smoking, this is worse and some wheezing at night, SOB with moving, this has limited activity) and tobacco abuse (smoking about the same, worsening cough, has chronic cough from smoking, cannot quit right now due to stress).   Review of Systems  Constitutional: Positive for activity change, appetite change and fatigue.  HENT: Positive for congestion and postnasal drip.   Eyes: Negative.   Respiratory: Positive for cough, shortness of breath and wheezing. Negative for chest tightness.   Cardiovascular: Negative for chest pain, palpitations and leg swelling.  Gastrointestinal: Negative for abdominal distention, abdominal pain, constipation, diarrhea, nausea and vomiting.  Musculoskeletal: Negative.   Skin: Negative.   Neurological: Negative.   Psychiatric/Behavioral: Positive for decreased concentration, dysphoric mood and sleep disturbance. Negative for self-injury and suicidal ideas. The patient is nervous/anxious.       Objective:   Physical Exam  Constitutional: She is oriented to person, place, and time. She appears well-developed and well-nourished.  HENT:  Head: Normocephalic and atraumatic.  Oropharynx with redness and clear drainage, nasal turbinates swollen  Eyes: EOM are normal.  Neck: Normal range of motion.  Cardiovascular: Normal rate and regular rhythm.  Pulmonary/Chest: Effort normal. No respiratory  distress. She has wheezes. She has no rales.  Abdominal: Soft. Bowel sounds are normal. She exhibits no distension. There is no tenderness. There is no rebound.  Musculoskeletal: She exhibits no edema.  Neurological: She is alert and oriented to person, place, and time. Coordination normal.  Skin: Skin is warm and dry.  Psychiatric:  Some distress when talking about her current situation   Vitals:   01/26/18 1350  BP: 110/80  Pulse: 82  Temp: 97.6 F (36.4 C)  TempSrc: Oral  SpO2: 96%  Weight: 134 lb (60.8 kg)  Height: 5\' 2"  (1.575 m)      Assessment & Plan:

## 2018-01-26 NOTE — Patient Instructions (Signed)
We have sent in augmentin to take 1 pill twice a day

## 2018-01-26 NOTE — Assessment & Plan Note (Signed)
Moderate at this time. Refill lexapro and advised her to call office before running out of meds as this can cause withdrawal symptoms. Offered counseling as an option but she does not have time now.

## 2018-02-08 ENCOUNTER — Ambulatory Visit (INDEPENDENT_AMBULATORY_CARE_PROVIDER_SITE_OTHER): Payer: Medicare Other | Admitting: *Deleted

## 2018-02-08 VITALS — BP 118/65 | HR 83 | Resp 17 | Ht 62.0 in | Wt 134.0 lb

## 2018-02-08 DIAGNOSIS — Z0000 Encounter for general adult medical examination without abnormal findings: Secondary | ICD-10-CM | POA: Diagnosis not present

## 2018-02-08 DIAGNOSIS — Z23 Encounter for immunization: Secondary | ICD-10-CM | POA: Diagnosis not present

## 2018-02-08 NOTE — Progress Notes (Signed)
Subjective:   Monique Swanson is a 62 y.o. female who presents for Medicare Annual (Subsequent) preventive examination.  Review of Systems:  No ROS.  Medicare Wellness Visit. Additional risk factors are reflected in the social history.    Sleep patterns: gets up 1-2 times nightly to void and sleeps 6-7 hours nightly.    Home Safety/Smoke Alarms: Feels safe in home. Smoke alarms in place.  Living environment; residence and Firearm Safety: 1-story house/ trailer, no firearms. Lives with family, no needs for DME, good support system Seat Belt Safety/Bike Helmet: Wears seat belt.     Objective:     Vitals: BP 118/65   Pulse 83   Resp 17   Ht 5\' 2"  (1.575 m)   Wt 134 lb (60.8 kg)   SpO2 98%   BMI 24.51 kg/m   Body mass index is 24.51 kg/m.  Advanced Directives 02/08/2018 06/21/2017 03/18/2016 07/31/2014  Does Patient Have a Medical Advance Directive? No No No No  Would patient like information on creating a medical advance directive? No - Patient declined No - Patient declined No - Patient declined No - patient declined information    Tobacco Social History   Tobacco Use  Smoking Status Current Every Day Smoker  . Packs/day: 0.50  . Years: 35.00  . Pack years: 17.50  . Types: Cigarettes  Smokeless Tobacco Never Used  Tobacco Comment   Widowed form given 03-14-12     Ready to quit: No Counseling given: No Comment: Widowed form given 03-14-12  Past Medical History:  Diagnosis Date  . CARCINOMA, SQUAMOUS CELL 2009   s/p rescrtion from R nose bridge  . DEPRESSION   . Headache(784.0)   . Hx of cardiovascular stress test    Lex MV 6/14:  Normal, EF 80%  . Irritable bowel syndrome   . Lymphocytic colitis   . Microscopic colitis dx 02/2009   chronic diarrhea  . Osteoarthritis   . OSTEOPENIA   . Osteoporosis, unspecified 09/30/2008   [facility] DEXA 05/2012: -2.7, recommended to start bisphos     Past Surgical History:  Procedure Laterality Date  . ABDOMINAL HYSTERECTOMY   1985  . APPENDECTOMY  1969  . BACK SURGERY  09/16/2014   disk replacement with fusion  . BREAST BIOPSY  1987   left  . BUNIONECTOMY Right   . FOOT SURGERY  1990   for joint in toes; bilateral  . TUBAL LIGATION     Family History  Problem Relation Age of Onset  . Dementia Mother   . Coronary artery disease Mother   . Diabetes Mother   . Asthma Father   . Ovarian cancer Sister   . Diabetes Sister   . Heart disease Sister        x   . Irritable bowel syndrome Sister   . Colon cancer Neg Hx    Social History   Socioeconomic History  . Marital status: Widowed    Spouse name: Not on file  . Number of children: 2  . Years of education: Not on file  . Highest education level: Not on file  Occupational History  . Occupation: Disabled  Social Needs  . Financial resource strain: Not hard at all  . Food insecurity:    Worry: Never true    Inability: Never true  . Transportation needs:    Medical: No    Non-medical: No  Tobacco Use  . Smoking status: Current Every Day Smoker    Packs/day: 0.50  Years: 35.00    Pack years: 17.50    Types: Cigarettes  . Smokeless tobacco: Never Used  . Tobacco comment: Widowed form given 03-14-12  Substance and Sexual Activity  . Alcohol use: No  . Drug use: No  . Sexual activity: Not Currently  Lifestyle  . Physical activity:    Days per week: 0 days    Minutes per session: 0 min  . Stress: Very much  Relationships  . Social connections:    Talks on phone: More than three times a week    Gets together: Once a week    Attends religious service: 1 to 4 times per year    Active member of club or organization: No    Attends meetings of clubs or organizations: Never    Relationship status: Widowed  Other Topics Concern  . Not on file  Social History Narrative   Widowed 08/24/09, 2 grown children. son in [place] and dtr in [place]    Outpatient Encounter Medications as of 02/08/2018  Medication Sig  . albuterol (PROVENTIL  HFA;VENTOLIN HFA) 108 (90 Base) MCG/ACT inhaler Inhale 2 puffs into the lungs every 6 (six) hours as needed for wheezing or shortness of breath.  . budesonide (ENTOCORT EC) 3 MG 24 hr capsule Take 3 mg by mouth 2 (two) times daily.  . cholecalciferol (VITAMIN D) 1000 UNITS tablet Take 1 tablet (1,000 Units total) by mouth daily.  . diclofenac sodium (VOLTAREN) 1 % GEL Apply 4 g topically 4 (four) times daily. (Patient taking differently: Apply 4 g topically 4 (four) times daily. )  . dicyclomine (BENTYL) 10 MG capsule Take 10 mg by mouth 4 (four) times daily -  before meals and at bedtime.  Marland Kitchen escitalopram (LEXAPRO) 20 MG tablet Take 1 tablet (20 mg total) by mouth daily.  . folic acid (FOLVITE) 1 MG tablet Take 1 tablet (1 mg total) by mouth daily.  Marland Kitchen imipramine (TOFRANIL) 50 MG tablet Take 1 tablet (50 mg total) by mouth at bedtime.  . ranitidine (ZANTAC) 150 MG tablet Take 1 tablet (150 mg total) by mouth as needed for heartburn.   No facility-administered encounter medications on file as of 02/08/2018.     Activities of Daily Living In your present state of health, do you have any difficulty performing the following activities: 02/08/2018  Hearing? N  Vision? N  Difficulty concentrating or making decisions? N  Walking or climbing stairs? N  Dressing or bathing? N  Doing errands, shopping? N  Preparing Food and eating ? N  Using the Toilet? N  In the past six months, have you accidently leaked urine? N  Do you have problems with loss of bowel control? N  Managing your Medications? N  Managing your Finances? N  Housekeeping or managing your Housekeeping? N  Some recent data might be hidden    Patient Care Team: Hoyt Koch, MD as PCP - General (Internal Medicine) Milus Banister, MD (Gastroenterology)    Assessment:   This is a routine wellness examination for Kyleigha. Physical assessment deferred to PCP.   Exercise Activities and Dietary recommendations Current  Exercise Habits: The patient does not participate in regular exercise at present, Exercise limited by: None identified  Diet (meal preparation, eat out, water intake, caffeinated beverages, dairy products, fruits and vegetables): in general, a "healthy" diet  , well balanced   Reviewed heart healthy diet. Encouraged patient to increase daily water and healthy fluid intake.  Goals  Patient Stated   . <enter goal here> (pt-stated)     "to get rid of my diarrhea", follow up with GI provider as needed.        Fall Risk Fall Risk  02/08/2018 03/18/2016  Falls in the past year? No Yes  Number falls in past yr: - 2 or more  Injury with Fall? - No  Risk for fall due to : - Impaired balance/gait  Follow up - Falls prevention discussed    Depression Screen PHQ 2/9 Scores 02/08/2018 01/26/2018  PHQ - 2 Score 6 6  PHQ- 9 Score 13 17     Cognitive Function       Ad8 score reviewed for issues:  Issues making decisions: no  Less interest in hobbies / activities: no  Repeats questions, stories (family complaining): no  Trouble using ordinary gadgets (microwave, computer, phone):no  Forgets the month or year: no  Mismanaging finances: no  Remembering appts: no  Daily problems with thinking and/or memory: no Ad8 score is= 0  Immunization History  Administered Date(s) Administered  . Influenza,inj,Quad PF,6+ Mos 03/18/2016, 12/16/2016, 02/08/2018  . Tdap 09/11/2014   Screening Tests Health Maintenance  Topic Date Due  . HIV Screening  10/08/1970  . MAMMOGRAM  08/26/2017  . COLONOSCOPY  11/15/2022  . TETANUS/TDAP  09/10/2024  . INFLUENZA VACCINE  Completed  . Hepatitis C Screening  Completed      Plan:      Patient states she will contact [facility] to make a screening mammogram appointment.  Continue doing brain stimulating activities (puzzles, reading, adult coloring books, staying active) to keep memory sharp.   Continue to eat heart healthy diet (full of  fruits, vegetables, whole grains, lean protein, water--limit salt, fat, and sugar intake) and increase physical activity as tolerated.  I have personally reviewed and noted the following in the patient's chart:   . Medical and social history . Use of alcohol, tobacco or illicit drugs  . Current medications and supplements . Functional ability and status . Nutritional status . Physical activity . Advanced directives . List of other physicians . Vitals . Screenings to include cognitive, depression, and falls . Referrals and appointments  In addition, I have reviewed and discussed with patient certain preventive protocols, quality metrics, and best practice recommendations. A written personalized care plan for preventive services as well as general preventive health recommendations were provided to patient.     Michiel Cowboy, RN  02/08/2018

## 2018-02-08 NOTE — Patient Instructions (Addendum)
Continue doing brain stimulating activities (puzzles, reading, adult coloring books, staying active) to keep memory sharp.   Continue to eat heart healthy diet (full of fruits, vegetables, whole grains, lean protein, water--limit salt, fat, and sugar intake) and increase physical activity as tolerated.  Influenza Virus Vaccine (Flucelvax) What is this medicine? INFLUENZA VIRUS VACCINE (in floo EN zuh VAHY ruhs vak SEEN) helps to reduce the risk of getting influenza also known as the flu. The vaccine only helps protect you against some strains of the flu. This medicine may be used for other purposes; ask your health care provider or pharmacist if you have questions. COMMON BRAND NAME(S): FLUCELVAX What should I tell my health care provider before I take this medicine? They need to know if you have any of these conditions: -bleeding disorder like hemophilia -fever or infection -Guillain-Barre syndrome or other neurological problems -immune system problems -infection with the human immunodeficiency virus (HIV) or AIDS -low blood platelet counts -multiple sclerosis -an unusual or allergic reaction to influenza virus vaccine, other medicines, foods, dyes or preservatives -pregnant or trying to get pregnant -breast-feeding How should I use this medicine? This vaccine is for injection into a muscle. It is given by a health care professional. A copy of Vaccine Information Statements will be given before each vaccination. Read this sheet carefully each time. The sheet may change frequently. Talk to your pediatrician regarding the use of this medicine in children. Special care may be needed. Overdosage: If you think you've taken too much of this medicine contact a poison control center or emergency room at once. Overdosage: If you think you have taken too much of this medicine contact a poison control center or emergency room at once. NOTE: This medicine is only for you. Do not share this medicine  with others. What if I miss a dose? This does not apply. What may interact with this medicine? -chemotherapy or radiation therapy -medicines that lower your immune system like etanercept, anakinra, infliximab, and adalimumab -medicines that treat or prevent blood clots like warfarin -phenytoin -steroid medicines like prednisone or cortisone -theophylline -vaccines This list may not describe all possible interactions. Give your health care provider a list of all the medicines, herbs, non-prescription drugs, or dietary supplements you use. Also tell them if you smoke, drink alcohol, or use illegal drugs. Some items may interact with your medicine. What should I watch for while using this medicine? Report any side effects that do not go away within 3 days to your doctor or health care professional. Call your health care provider if any unusual symptoms occur within 6 weeks of receiving this vaccine. You may still catch the flu, but the illness is not usually as bad. You cannot get the flu from the vaccine. The vaccine will not protect against colds or other illnesses that may cause fever. The vaccine is needed every year. What side effects may I notice from receiving this medicine? Side effects that you should report to your doctor or health care professional as soon as possible: -allergic reactions like skin rash, itching or hives, swelling of the face, lips, or tongue Side effects that usually do not require medical attention (Report these to your doctor or health care professional if they continue or are bothersome.): -fever -headache -muscle aches and pains -pain, tenderness, redness, or swelling at the injection site -tiredness This list may not describe all possible side effects. Call your doctor for medical advice about side effects. You may report side effects to FDA at  1-800-FDA-1088. Where should I keep my medicine? The vaccine will be given by a health care professional in a clinic,  pharmacy, doctor's office, or other health care setting. You will not be given vaccine doses to store at home. NOTE: This sheet is a summary. It may not cover all possible information. If you have questions about this medicine, talk to your doctor, pharmacist, or health care provider.  2018 Elsevier/Gold Standard (2011-03-10 14:06:47)   Health Maintenance, Female Adopting a healthy lifestyle and getting preventive care can go a long way to promote health and wellness. Talk with your health care provider about what schedule of regular examinations is right for you. This is a good chance for you to check in with your provider about disease prevention and staying healthy. In between checkups, there are plenty of things you can do on your own. Experts have done a lot of research about which lifestyle changes and preventive measures are most likely to keep you healthy. Ask your health care provider for more information. Weight and diet Eat a healthy diet  Be sure to include plenty of vegetables, fruits, low-fat dairy products, and lean protein.  Do not eat a lot of foods high in solid fats, added sugars, or salt.  Get regular exercise. This is one of the most important things you can do for your health. ? Most adults should exercise for at least 150 minutes each week. The exercise should increase your heart rate and make you sweat (moderate-intensity exercise). ? Most adults should also do strengthening exercises at least twice a week. This is in addition to the moderate-intensity exercise.  Maintain a healthy weight  Body mass index (BMI) is a measurement that can be used to identify possible weight problems. It estimates body fat based on height and weight. Your health care provider can help determine your BMI and help you achieve or maintain a healthy weight.  For females 11 years of age and older: ? A BMI below 18.5 is considered underweight. ? A BMI of 18.5 to 24.9 is normal. ? A BMI of 25  to 29.9 is considered overweight. ? A BMI of 30 and above is considered obese.  Watch levels of cholesterol and blood lipids  You should start having your blood tested for lipids and cholesterol at 62 years of age, then have this test every 5 years.  You may need to have your cholesterol levels checked more often if: ? Your lipid or cholesterol levels are high. ? You are older than 62 years of age. ? You are at high risk for heart disease.  Cancer screening Lung Cancer  Lung cancer screening is recommended for adults 33-106 years old who are at high risk for lung cancer because of a history of smoking.  A yearly low-dose CT scan of the lungs is recommended for people who: ? Currently smoke. ? Have quit within the past 15 years. ? Have at least a 30-pack-year history of smoking. A pack year is smoking an average of one pack of cigarettes a day for 1 year.  Yearly screening should continue until it has been 15 years since you quit.  Yearly screening should stop if you develop a health problem that would prevent you from having lung cancer treatment.  Breast Cancer  Practice breast self-awareness. This means understanding how your breasts normally appear and feel.  It also means doing regular breast self-exams. Let your health care provider know about any changes, no matter how small.  If  you are in your 20s or 30s, you should have a clinical breast exam (CBE) by a health care provider every 1-3 years as part of a regular health exam.  If you are 74 or older, have a CBE every year. Also consider having a breast X-ray (mammogram) every year.  If you have a family history of breast cancer, talk to your health care provider about genetic screening.  If you are at high risk for breast cancer, talk to your health care provider about having an MRI and a mammogram every year.  Breast cancer gene (BRCA) assessment is recommended for women who have family members with BRCA-related cancers.  BRCA-related cancers include: ? Breast. ? Ovarian. ? Tubal. ? Peritoneal cancers.  Results of the assessment will determine the need for genetic counseling and BRCA1 and BRCA2 testing.  Cervical Cancer Your health care provider may recommend that you be screened regularly for cancer of the pelvic organs (ovaries, uterus, and vagina). This screening involves a pelvic examination, including checking for microscopic changes to the surface of your cervix (Pap test). You may be encouraged to have this screening done every 3 years, beginning at age 14.  For women ages 3-65, health care providers may recommend pelvic exams and Pap testing every 3 years, or they may recommend the Pap and pelvic exam, combined with testing for human papilloma virus (HPV), every 5 years. Some types of HPV increase your risk of cervical cancer. Testing for HPV may also be done on women of any age with unclear Pap test results.  Other health care providers may not recommend any screening for nonpregnant women who are considered low risk for pelvic cancer and who do not have symptoms. Ask your health care provider if a screening pelvic exam is right for you.  If you have had past treatment for cervical cancer or a condition that could lead to cancer, you need Pap tests and screening for cancer for at least 20 years after your treatment. If Pap tests have been discontinued, your risk factors (such as having a new sexual partner) need to be reassessed to determine if screening should resume. Some women have medical problems that increase the chance of getting cervical cancer. In these cases, your health care provider may recommend more frequent screening and Pap tests.  Colorectal Cancer  This type of cancer can be detected and often prevented.  Routine colorectal cancer screening usually begins at 61 years of age and continues through 62 years of age.  Your health care provider may recommend screening at an earlier age if  you have risk factors for colon cancer.  Your health care provider may also recommend using home test kits to check for hidden blood in the stool.  A small camera at the end of a tube can be used to examine your colon directly (sigmoidoscopy or colonoscopy). This is done to check for the earliest forms of colorectal cancer.  Routine screening usually begins at age 4.  Direct examination of the colon should be repeated every 5-10 years through 62 years of age. However, you may need to be screened more often if early forms of precancerous polyps or small growths are found.  Skin Cancer  Check your skin from head to toe regularly.  Tell your health care provider about any new moles or changes in moles, especially if there is a change in a mole's shape or color.  Also tell your health care provider if you have a mole that is larger  than the size of a pencil eraser.  Always use sunscreen. Apply sunscreen liberally and repeatedly throughout the day.  Protect yourself by wearing long sleeves, pants, a wide-brimmed hat, and sunglasses whenever you are outside.  Heart disease, diabetes, and high blood pressure  High blood pressure causes heart disease and increases the risk of stroke. High blood pressure is more likely to develop in: ? People who have blood pressure in the high end of the normal range (130-139/85-89 mm Hg). ? People who are overweight or obese. ? People who are African American.  If you are 1-46 years of age, have your blood pressure checked every 3-5 years. If you are 41 years of age or older, have your blood pressure checked every year. You should have your blood pressure measured twice-once when you are at a hospital or clinic, and once when you are not at a hospital or clinic. Record the average of the two measurements. To check your blood pressure when you are not at a hospital or clinic, you can use: ? An automated blood pressure machine at a pharmacy. ? A home blood  pressure monitor.  If you are between 48 years and 66 years old, ask your health care provider if you should take aspirin to prevent strokes.  Have regular diabetes screenings. This involves taking a blood sample to check your fasting blood sugar level. ? If you are at a normal weight and have a low risk for diabetes, have this test once every three years after 62 years of age. ? If you are overweight and have a high risk for diabetes, consider being tested at a younger age or more often. Preventing infection Hepatitis B  If you have a higher risk for hepatitis B, you should be screened for this virus. You are considered at high risk for hepatitis B if: ? You were born in a country where hepatitis B is common. Ask your health care provider which countries are considered high risk. ? Your parents were born in a high-risk country, and you have not been immunized against hepatitis B (hepatitis B vaccine). ? You have HIV or AIDS. ? You use needles to inject Dieu drugs. ? You live with someone who has hepatitis B. ? You have had sex with someone who has hepatitis B. ? You get hemodialysis treatment. ? You take certain medicines for conditions, including cancer, organ transplantation, and autoimmune conditions.  Hepatitis C  Blood testing is recommended for: ? Everyone born from [birth year] through 1965. ? Anyone with known risk factors for hepatitis C.  Sexually transmitted infections (STIs)  You should be screened for sexually transmitted infections (STIs) including gonorrhea and chlamydia if: ? You are sexually active and are younger than 62 years of age. ? You are older than 62 years of age and your health care provider tells you that you are at risk for this type of infection. ? Your sexual activity has changed since you were last screened and you are at an increased risk for chlamydia or gonorrhea. Ask your health care provider if you are at risk.  If you do not have HIV, but are at risk,  it may be recommended that you take a prescription medicine daily to prevent HIV infection. This is called pre-exposure prophylaxis (PrEP). You are considered at risk if: ? You are sexually active and do not regularly use condoms or know the HIV status of your partner(s). ? You take drugs by injection. ? You are sexually active with  a partner who has HIV.  Talk with your health care provider about whether you are at high risk of being infected with HIV. If you choose to begin PrEP, you should first be tested for HIV. You should then be tested every 3 months for as long as you are taking PrEP. Pregnancy  If you are premenopausal and you may become pregnant, ask your health care provider about preconception counseling.  If you may become pregnant, take 400 to 800 micrograms (mcg) of folic acid every day.  If you want to prevent pregnancy, talk to your health care provider about birth control (contraception). Osteoporosis and menopause  Osteoporosis is a disease in which the bones lose minerals and strength with aging. This can result in serious bone fractures. Your risk for osteoporosis can be identified using a bone density scan.  If you are 45 years of age or older, or if you are at risk for osteoporosis and fractures, ask your health care provider if you should be screened.  Ask your health care provider whether you should take a calcium or vitamin D supplement to lower your risk for osteoporosis.  Menopause may have certain physical symptoms and risks.  Hormone replacement therapy may reduce some of these symptoms and risks. Talk to your health care provider about whether hormone replacement therapy is right for you. Follow these instructions at home:  Schedule regular health, dental, and eye exams.  Stay current with your immunizations.  Do not use any tobacco products including cigarettes, chewing tobacco, or electronic cigarettes.  If you are pregnant, do not drink  alcohol.  If you are breastfeeding, limit how much and how often you drink alcohol.  Limit alcohol intake to no more than 1 drink per day for nonpregnant women. One drink equals 12 ounces of beer, 5 ounces of Lisamarie Coke, or 1 ounces of hard liquor.  Do not use Cihlar drugs.  Do not share needles.  Ask your health care provider for help if you need support or information about quitting drugs.  Tell your health care provider if you often feel depressed.  Tell your health care provider if you have ever been abused or do not feel safe at home. This information is not intended to replace advice given to you by your health care provider. Make sure you discuss any questions you have with your health care provider. Document Released: 10/12/2010 Document Revised: 09/04/2015 Document Reviewed: 12/31/2014 Elsevier Interactive Patient Education  2018 [place] and Stress Management Stress is a normal reaction to life events. It is what you feel when life demands more than you are used to or more than you can handle. Some stress can be useful. For example, the stress reaction can help you catch the last bus of the day, study for a test, or meet a deadline at work. But stress that occurs too often or for too long can cause problems. It can affect your emotional health and interfere with relationships and normal daily activities. Too much stress can weaken your immune system and increase your risk for physical illness. If you already have a medical problem, stress can make it worse. What are the causes? All sorts of life events may cause stress. An event that causes stress for one person may not be stressful for another person. Major life events commonly cause stress. These may be positive or negative. Examples include losing your job, moving into a new home, getting married, having a baby, or losing a loved one.  Less obvious life events may also cause stress, especially if they occur day after day or  in combination. Examples include working long hours, driving in traffic, caring for children, being in debt, or being in a difficult relationship. What are the signs or symptoms? Stress may cause emotional symptoms including, the following:  Anxiety. This is feeling worried, afraid, on edge, overwhelmed, or out of control.  Anger. This is feeling irritated or impatient.  Depression. This is feeling sad, down, helpless, or guilty.  Difficulty focusing, remembering, or making decisions.  Stress may cause physical symptoms, including the following:  Aches and pains. These may affect your head, neck, back, stomach, or other areas of your body.  Tight muscles or clenched jaw.  Low energy or trouble sleeping.  Stress may cause unhealthy behaviors, including the following:  Eating to feel better (overeating) or skipping meals.  Sleeping too little, too much, or both.  Working too much or putting off tasks (procrastination).  Smoking, drinking alcohol, or using drugs to feel better.  How is this diagnosed? Stress is diagnosed through an assessment by your health care provider. Your health care provider will ask questions about your symptoms and any stressful life events.Your health care provider will also ask about your medical history and may order blood tests or other tests. Certain medical conditions and medicine can cause physical symptoms similar to stress. Mental illness can cause emotional symptoms and unhealthy behaviors similar to stress. Your health care provider may refer you to a mental health professional for further evaluation. How is this treated? Stress management is the recommended treatment for stress.The goals of stress management are reducing stressful life events and coping with stress in healthy ways. Techniques for reducing stressful life events include the following:  Stress identification. Self-monitor for stress and identify what causes stress for you. These  skills may help you to avoid some stressful events.  Time management. Set your priorities, keep a calendar of events, and learn to say "no." These tools can help you avoid making too many commitments.  Techniques for coping with stress include the following:  Rethinking the problem. Try to think realistically about stressful events rather than ignoring them or overreacting. Try to find the positives in a stressful situation rather than focusing on the negatives.  Exercise. Physical exercise can release both physical and emotional tension. The key is to find a form of exercise you enjoy and do it regularly.  Relaxation techniques. These relax the body and mind. Examples include yoga, meditation, tai chi, biofeedback, deep breathing, progressive muscle relaxation, listening to music, being out in nature, journaling, and other hobbies. Again, the key is to find one or more that you enjoy and can do regularly.  Healthy lifestyle. Eat a balanced diet, get plenty of sleep, and do not smoke. Avoid using alcohol or drugs to relax.  Strong support network. Spend time with family, friends, or other people you enjoy being around.Express your feelings and talk things over with someone you trust.  Counseling or talktherapy with a mental health professional may be helpful if you are having difficulty managing stress on your own. Medicine is typically not recommended for the treatment of stress.Talk to your health care provider if you think you need medicine for symptoms of stress. Follow these instructions at home:  Keep all follow-up visits as directed by your health care provider.  Take all medicines as directed by your health care provider. Contact a health care provider if:  Your symptoms get worse  or you start having new symptoms.  You feel overwhelmed by your problems and can no longer manage them on your own. Get help right away if:  You feel like hurting yourself or someone else. This  information is not intended to replace advice given to you by your health care provider. Make sure you discuss any questions you have with your health care provider. Document Released: 09/22/2000 Document Revised: 09/04/2015 Document Reviewed: 11/21/2012 Elsevier Interactive Patient Education  2017 [employer].

## 2018-02-14 ENCOUNTER — Other Ambulatory Visit: Payer: Self-pay | Admitting: Internal Medicine

## 2018-02-15 NOTE — Progress Notes (Signed)
Medical screening examination/treatment/procedure(s) were performed by non-physician practitioner and as supervising physician I was immediately available for consultation/collaboration. I agree with above. Torrell Krutz A Samuel Mcpeek, MD 

## 2018-03-16 DIAGNOSIS — K52832 Lymphocytic colitis: Secondary | ICD-10-CM | POA: Diagnosis not present

## 2018-09-26 DIAGNOSIS — K52832 Lymphocytic colitis: Secondary | ICD-10-CM | POA: Diagnosis not present

## 2018-09-26 DIAGNOSIS — R634 Abnormal weight loss: Secondary | ICD-10-CM | POA: Diagnosis not present

## 2018-09-26 DIAGNOSIS — K219 Gastro-esophageal reflux disease without esophagitis: Secondary | ICD-10-CM | POA: Diagnosis not present

## 2018-10-03 DIAGNOSIS — R634 Abnormal weight loss: Secondary | ICD-10-CM | POA: Diagnosis not present

## 2018-10-15 ENCOUNTER — Other Ambulatory Visit: Payer: Self-pay | Admitting: Internal Medicine

## 2018-11-23 ENCOUNTER — Ambulatory Visit
Admission: EM | Admit: 2018-11-23 | Discharge: 2018-11-23 | Disposition: A | Discharge status: Home / Self Care | Payer: Medicare HMO | Attending: Physician Assistant | Admitting: Physician Assistant

## 2018-11-23 ENCOUNTER — Other Ambulatory Visit: Payer: Self-pay

## 2018-11-23 DIAGNOSIS — R05 Cough: Secondary | ICD-10-CM

## 2018-11-23 DIAGNOSIS — R059 Cough, unspecified: Secondary | ICD-10-CM

## 2018-11-23 DIAGNOSIS — R0602 Shortness of breath: Secondary | ICD-10-CM

## 2018-11-23 DIAGNOSIS — R5383 Other fatigue: Secondary | ICD-10-CM | POA: Diagnosis not present

## 2018-11-23 DIAGNOSIS — Z7689 Persons encountering health services in other specified circumstances: Secondary | ICD-10-CM | POA: Diagnosis not present

## 2018-11-23 DIAGNOSIS — L239 Allergic contact dermatitis, unspecified cause: Secondary | ICD-10-CM

## 2018-11-23 DIAGNOSIS — Z20828 Contact with and (suspected) exposure to other viral communicable diseases: Secondary | ICD-10-CM | POA: Diagnosis not present

## 2018-11-23 MED ORDER — DEXAMETHASONE SODIUM PHOSPHATE 10 MG/ML IJ SOLN
10.0000 mg | Freq: Once | INTRAMUSCULAR | Status: AC
  Administered 2018-11-23: 10 mg via INTRAMUSCULAR

## 2018-11-23 MED ORDER — PREDNISONE 50 MG PO TABS: 50.0000 mg | ORAL_TABLET | Freq: Every day | ORAL | 0 refills | Status: DC

## 2018-11-23 MED ORDER — HYDROXYZINE HCL 25 MG PO TABS: 25.0000 mg | ORAL_TABLET | Freq: Four times a day (QID) | ORAL | 0 refills | Status: DC | PRN

## 2018-11-23 NOTE — ED Triage Notes (Signed)
Pt states was cutting shrubberies on Monday and has bites to face/neck/arms. States since Tuesday she gives out of breath walking, doesn't feel good, feels very tired.

## 2018-11-23 NOTE — Discharge Instructions (Addendum)
Start prednisone as directed. Can switch to hydroxyzine for itching. Ice compress. Monitor for spreading redness, warmth, fever, follow up for reevaluation needed.  As discussed, given you also have cough, shortness of breath, we are testing you for COVID. Please stay quarantined until results return. If experiencing worsening shortness of breath, cough, fever, go to the ED for further evaluation needed.

## 2018-11-23 NOTE — ED Provider Notes (Signed)
[facility]    CSN: [id number] Arrival date & time: 11/23/18  1037      History   Chief Complaint Chief Complaint  Patient presents with  . Insect Bite    HPI Monique Swanson is a 63 y.o. female.   63 year old female with history of COPD, depression, colitis comes in for 4-day history of rash/insect bites to the face, neck, arms.  States she was cutting shrubberies prior to current symptom onset.  Rash was initially to bilateral forearms, neck, face, where skin was exposed.  Now spreading down the neck, and her chest is slightly itching.  Rash is itching and sensation, without pain, spreading erythema, warmth.  Denies fever, chills, night sweats.  2 to 3 days ago, she started noticing generalized fatigue, and has noticed some dyspnea on exertion.  She does not know if this is related to the rash.  She denies oral swelling, sore throat, tripoding, drooling, trismus, trouble swallowing.  She does have a cough, but states has cough at baseline due to smoking.  Denies rhinorrhea, nasal congestion.  Denies abdominal pain, nausea, vomiting, diarrhea.  Denies loss of taste or smell.  No obvious sick contact.     Past Medical History:  Diagnosis Date  . CARCINOMA, SQUAMOUS CELL 2009   s/p rescrtion from R nose bridge  . DEPRESSION   . Headache(784.0)   . Hx of cardiovascular stress test    Lex MV 6/14:  Normal, EF 80%  . Irritable bowel syndrome   . Lymphocytic colitis   . Microscopic colitis dx 02/2009   chronic diarrhea  . Osteoarthritis   . OSTEOPENIA   . Osteoporosis, unspecified 09/30/2008   LeB DEXA 05/2012: -2.7, recommended to start bisphos      Patient Active Problem List   Diagnosis Date Noted  . COPD (chronic obstructive pulmonary disease) ([place]) 12/19/2016  . Medicare annual wellness visit, subsequent 03/18/2016  . Routine general medical examination at a health care facility 02/25/2015  . Lymphocytic colitis 07/31/2012  . Smokers' cough ([place])   .  DEFICIENCY OF OTHER VITAMINS 09/30/2008  . Osteoporosis, unspecified 09/30/2008  . CARCINOMA, SQUAMOUS CELL 07/16/2008  . Major depressive disorder, recurrent episode ([place]) 07/16/2008  . ARTHRITIS 07/16/2008  . HEADACHE 07/16/2008    Past Surgical History:  Procedure Laterality Date  . ABDOMINAL HYSTERECTOMY  1985  . APPENDECTOMY  1969  . BACK SURGERY  09/16/2014   disk replacement with fusion  . BREAST BIOPSY  1987   left  . BUNIONECTOMY Right   . FOOT SURGERY  1990   for joint in toes; bilateral  . TUBAL LIGATION      OB History   No obstetric history on file.      Home Medications    Prior to Admission medications   Medication Sig Start Date End Date Taking? Authorizing Provider  budesonide (ENTOCORT EC) 3 MG 24 hr capsule Take 3 mg by mouth 2 (two) times daily.    [provider]  cholecalciferol (VITAMIN D) 1000 UNITS tablet Take 1 tablet (1,000 Units total) by mouth daily. 05/18/12   Rowe Clack, MD  diclofenac sodium (VOLTAREN) 1 % GEL Apply 4 g topically 4 (four) times daily. Patient taking differently: Apply 4 g topically 4 (four) times daily.  06/21/17   Joy, Shawn C, PA-C  dicyclomine (BENTYL) 10 MG capsule Take 10 mg by mouth 4 (four) times daily -  before meals and at bedtime.    [provider]  escitalopram (  LEXAPRO) 20 MG tablet Take 1 tablet (20 mg total) by mouth daily. 01/26/18   Hoyt Koch, MD  folic acid (FOLVITE) 1 MG tablet Take 1 tablet (1 mg total) by mouth daily. 02/13/15   Milus Banister, MD  hydrOXYzine (ATARAX/VISTARIL) 25 MG tablet Take 1 tablet (25 mg total) by mouth every 6 (six) hours as needed for itching. 11/23/18   Tasia Catchings, Akash Winski V, PA-C  imipramine (TOFRANIL) 50 MG tablet Take 1 tablet (50 mg total) by mouth at bedtime. 01/26/18   Hoyt Koch, MD  predniSONE (DELTASONE) 50 MG tablet Take 1 tablet (50 mg total) by mouth daily with breakfast. 11/23/18   Tasia Catchings, Pang Robers V, PA-C  ranitidine (ZANTAC) 150 MG tablet  Take 1 tablet (150 mg total) by mouth as needed for heartburn. 01/26/18   Hoyt Koch, MD  VENTOLIN HFA 108 [phone] Base) MCG/ACT inhaler TAKE 2 PUFFS BY MOUTH EVERY 6 HOURS AS NEEDED FOR WHEEZE OR SHORTNESS OF BREATH 10/16/18   Hoyt Koch, MD    Family History Family History  Problem Relation Age of Onset  . Dementia Mother   . Coronary artery disease Mother   . Diabetes Mother   . Asthma Father   . Ovarian cancer Sister   . Diabetes Sister   . Heart disease Sister        x   . Irritable bowel syndrome Sister   . Colon cancer Neg Hx     Social History Social History   Tobacco Use  . Smoking status: Current Every Day Smoker    Packs/day: 0.50    Years: 35.00    Pack years: 17.50    Types: Cigarettes  . Smokeless tobacco: Never Used  . Tobacco comment: Widowed form given 03-14-12  Substance Use Topics  . Alcohol use: No  . Drug use: No     Allergies   Tetracycline   Review of Systems Review of Systems  Reason unable to perform ROS: See HPI as above.     Physical Exam Triage Vital Signs ED Triage Vitals [11/23/18 1044]  Enc Vitals Group     BP 123/75     Pulse Rate 95     Resp 18     Temp 98 F (36.7 C)     Temp Source Oral     SpO2 98 %     Weight      Height      Head Circumference      Peak Flow      Pain Score 0     Pain Loc      Pain Edu?      Excl. in [place]?    No data found.  Updated Vital Signs BP 123/75 (BP Location: Left Arm)   Pulse 95   Temp 98 F (36.7 C) (Oral)   Resp 18   SpO2 98%   Physical Exam Constitutional:      General: She is not in acute distress.    Appearance: Normal appearance. She is not ill-appearing, toxic-appearing or diaphoretic.  HENT:     Head: Normocephalic and atraumatic.     Mouth/Throat:     Mouth: Mucous membranes are moist.     Pharynx: Oropharynx is clear. Uvula midline.  Eyes:     Extraocular Movements: Extraocular movements intact.     Conjunctiva/sclera: Conjunctivae normal.      Pupils: Pupils are equal, round, and reactive to light.     Comments: Mild erythema and swelling to  bilateral upper and lower eyelid. No tenderness to palpation. No pain with EOM.   Neck:     Musculoskeletal: Normal range of motion and neck supple.  Cardiovascular:     Rate and Rhythm: Normal rate and regular rhythm.     Heart sounds: Normal heart sounds. No murmur. No friction rub. No gallop.   Pulmonary:     Effort: Pulmonary effort is normal. No accessory muscle usage, prolonged expiration, respiratory distress or retractions.     Comments: Lungs clear to auscultation without adventitious lung sounds. Skin:    General: Skin is dry.     Comments: Maculopapular rash to bilateral forearms with scratch marks and abrasions from scratching. No signs of cellulitis. Similar rash to bilateral neck, right cheek>left. No warmth, tenderness.   Neurological:     General: No focal deficit present.     Mental Status: She is alert and oriented to person, place, and time.      UC Treatments / Results  Labs (all labs ordered are listed, but only abnormal results are displayed) Labs Reviewed - No data to display  EKG   Radiology No results found.  Procedures Procedures (including critical care time)  Medications Ordered in UC Medications  dexamethasone (DECADRON) injection 10 mg (has no administration in time range)    Initial Impression / Assessment and Plan / UC Course  I have reviewed the triage vital signs and the nursing notes.  Pertinent labs & imaging results that were available during my care of the patient were reviewed by me and considered in my medical decision making (see chart for details).    Discussed case with Dr. Joseph Art, as patient already on budesonide for colitis.  Will provide Decadron injection and prednisone for contact dermatitis.  Discussed other symptomatic treatment.  Given patient also with generalized fatigue, cough, dyspnea on exertion, will also swab  for COVID.  Testing sent.  Patient to remain quarantined until results return.  Strict return precautions given.  Patient expresses understanding and agrees to plan.  Final Clinical Impressions(s) / UC Diagnoses   Final diagnoses:  Allergic contact dermatitis, unspecified trigger  Cough  Shortness of breath  Fatigue, unspecified type    ED Prescriptions    Medication Sig Dispense Auth. Provider   predniSONE (DELTASONE) 50 MG tablet Take 1 tablet (50 mg total) by mouth daily with breakfast. 5 tablet Joyleen Haselton V, PA-C   hydrOXYzine (ATARAX/VISTARIL) 25 MG tablet Take 1 tablet (25 mg total) by mouth every 6 (six) hours as needed for itching. 12 tablet Tobin Chad, [place] 11/23/18 1236

## 2018-11-24 LAB — NOVEL CORONAVIRUS, NAA: SARS-CoV-2, NAA: NOT DETECTED

## 2018-11-27 ENCOUNTER — Encounter (HOSPITAL_COMMUNITY): Payer: Self-pay

## 2018-12-20 DIAGNOSIS — K219 Gastro-esophageal reflux disease without esophagitis: Secondary | ICD-10-CM | POA: Diagnosis not present

## 2018-12-20 DIAGNOSIS — K52832 Lymphocytic colitis: Secondary | ICD-10-CM | POA: Diagnosis not present

## 2018-12-20 DIAGNOSIS — R634 Abnormal weight loss: Secondary | ICD-10-CM | POA: Diagnosis not present

## 2019-01-04 ENCOUNTER — Other Ambulatory Visit: Payer: Self-pay | Admitting: Internal Medicine

## 2019-01-04 DIAGNOSIS — F331 Major depressive disorder, recurrent, moderate: Secondary | ICD-10-CM

## 2019-01-04 NOTE — Telephone Encounter (Signed)
Can do 30 day but due for visit for any more refills.

## 2019-01-08 NOTE — Telephone Encounter (Signed)
Called and left message for patient.

## 2019-01-19 ENCOUNTER — Other Ambulatory Visit: Payer: Self-pay | Admitting: Internal Medicine

## 2019-01-19 DIAGNOSIS — F331 Major depressive disorder, recurrent, moderate: Secondary | ICD-10-CM

## 2019-01-31 ENCOUNTER — Other Ambulatory Visit: Payer: Self-pay | Admitting: Family

## 2019-01-31 DIAGNOSIS — F331 Major depressive disorder, recurrent, moderate: Secondary | ICD-10-CM

## 2019-02-07 ENCOUNTER — Encounter: Payer: Self-pay | Admitting: Internal Medicine

## 2019-02-07 ENCOUNTER — Ambulatory Visit (INDEPENDENT_AMBULATORY_CARE_PROVIDER_SITE_OTHER): Payer: Medicare HMO | Admitting: Internal Medicine

## 2019-02-07 DIAGNOSIS — F331 Major depressive disorder, recurrent, moderate: Secondary | ICD-10-CM | POA: Diagnosis not present

## 2019-02-07 DIAGNOSIS — R69 Illness, unspecified: Secondary | ICD-10-CM | POA: Diagnosis not present

## 2019-02-07 DIAGNOSIS — Z1231 Encounter for screening mammogram for malignant neoplasm of breast: Secondary | ICD-10-CM | POA: Diagnosis not present

## 2019-02-07 DIAGNOSIS — J410 Simple chronic bronchitis: Secondary | ICD-10-CM

## 2019-02-07 MED ORDER — ESCITALOPRAM OXALATE 20 MG PO TABS: 20.0000 mg | ORAL_TABLET | Freq: Every day | ORAL | 3 refills | Status: DC

## 2019-02-07 MED ORDER — IMIPRAMINE HCL 50 MG PO TABS: ORAL_TABLET | ORAL | 3 refills | Status: DC

## 2019-02-07 NOTE — Progress Notes (Signed)
Virtual Visit via Video Note  I connected with [employer] on 02/07/19 at  9:20 AM EDT by a video enabled telemedicine application and verified that I am speaking with the correct person using two identifiers.  The patient and the provider were at separate locations throughout the entire encounter.   I discussed the limitations of evaluation and management by telemedicine and the availability of in person appointments. The patient expressed understanding and agreed to proceed. The patient and the provider were the only parties present for the visit unless noted in HPI below.  History of Present Illness: The patient is a 63 y.o. female with visit for follow up depression. Is stable on her lexapro and imipramine. Has no SI/HI. Denies change in symptoms and is coping well with the pandemic. Has tried other things before.  Observations/Objective: Appearance: normal, breathing appears normal, casual grooming, abdomen does not appear distended, throat normal, memory normal, mental status is A and O times 3  Assessment and Plan: See problem oriented charting  Follow Up Instructions: refill lexapro and imipramine and ordered mammogram, she will defer labs for now  I discussed the assessment and treatment plan with the patient. The patient was provided an opportunity to ask questions and all were answered. The patient agreed with the plan and demonstrated an understanding of the instructions.   The patient was advised to call back or seek an in-person evaluation if the symptoms worsen or if the condition fails to improve as anticipated.  Hoyt Koch, MD

## 2019-02-09 NOTE — Assessment & Plan Note (Signed)
Still smoking and advised to quit but she is unable to make that change at this time.

## 2019-02-09 NOTE — Assessment & Plan Note (Signed)
Taking lexapro and imipramine and controlled. Advised of additional coping skills given pandemic.

## 2019-02-19 DIAGNOSIS — Z1231 Encounter for screening mammogram for malignant neoplasm of breast: Secondary | ICD-10-CM | POA: Diagnosis not present

## 2019-02-19 LAB — HM MAMMOGRAPHY

## 2019-02-27 ENCOUNTER — Encounter: Payer: Self-pay | Admitting: Internal Medicine

## 2019-02-27 DIAGNOSIS — J8410 Pulmonary fibrosis, unspecified: Secondary | ICD-10-CM

## 2019-02-28 ENCOUNTER — Other Ambulatory Visit: Payer: Self-pay

## 2019-02-28 ENCOUNTER — Ambulatory Visit (INDEPENDENT_AMBULATORY_CARE_PROVIDER_SITE_OTHER)
Admission: RE | Admit: 2019-02-28 | Discharge: 2019-02-28 | Disposition: A | Discharge status: Home / Self Care | Payer: Medicare HMO | Source: Ambulatory Visit | Attending: Internal Medicine | Admitting: Internal Medicine

## 2019-02-28 DIAGNOSIS — J8410 Pulmonary fibrosis, unspecified: Secondary | ICD-10-CM

## 2019-03-01 ENCOUNTER — Encounter: Payer: Self-pay | Admitting: Internal Medicine

## 2019-03-01 DIAGNOSIS — R0602 Shortness of breath: Secondary | ICD-10-CM

## 2019-03-01 NOTE — Telephone Encounter (Signed)
Order has been faxed to [facility]

## 2019-03-05 ENCOUNTER — Telehealth: Payer: Self-pay

## 2019-03-05 ENCOUNTER — Telehealth: Payer: Self-pay | Admitting: Internal Medicine

## 2019-03-05 NOTE — Telephone Encounter (Signed)
Returned call to patient regarding AWV. Patient would like schedule AWV for 2021. Will give office a call when ready to schedule. SF

## 2019-03-05 NOTE — Telephone Encounter (Addendum)
Patient just wanted to know about what she was supposed to do for the pulse oximetry test. Informed patient that she would get a call to schedule that and get it set up. Patient stated understanding

## 2019-03-05 NOTE — Telephone Encounter (Signed)
Copied from [place] [id number]. Topic: General - Inquiry >> Mar 05, 2019  1:49 PM Alease Frame wrote: Reason for CRM: Patient called in to speak with Dr Charlynne Cousins nurse . Please advise

## 2019-03-16 ENCOUNTER — Encounter: Payer: Self-pay | Admitting: Internal Medicine

## 2019-03-16 DIAGNOSIS — J8410 Pulmonary fibrosis, unspecified: Secondary | ICD-10-CM

## 2019-03-16 DIAGNOSIS — J42 Unspecified chronic bronchitis: Secondary | ICD-10-CM

## 2019-03-30 DIAGNOSIS — Z809 Family history of malignant neoplasm, unspecified: Secondary | ICD-10-CM | POA: Diagnosis not present

## 2019-03-30 DIAGNOSIS — Z720 Tobacco use: Secondary | ICD-10-CM | POA: Diagnosis not present

## 2019-03-30 DIAGNOSIS — J8410 Pulmonary fibrosis, unspecified: Secondary | ICD-10-CM | POA: Diagnosis not present

## 2019-03-30 DIAGNOSIS — Z7952 Long term (current) use of systemic steroids: Secondary | ICD-10-CM | POA: Diagnosis not present

## 2019-03-30 DIAGNOSIS — K219 Gastro-esophageal reflux disease without esophagitis: Secondary | ICD-10-CM | POA: Diagnosis not present

## 2019-03-30 DIAGNOSIS — J439 Emphysema, unspecified: Secondary | ICD-10-CM | POA: Diagnosis not present

## 2019-03-30 DIAGNOSIS — K5190 Ulcerative colitis, unspecified, without complications: Secondary | ICD-10-CM | POA: Diagnosis not present

## 2019-03-30 DIAGNOSIS — R69 Illness, unspecified: Secondary | ICD-10-CM | POA: Diagnosis not present

## 2019-03-30 DIAGNOSIS — Z8249 Family history of ischemic heart disease and other diseases of the circulatory system: Secondary | ICD-10-CM | POA: Diagnosis not present

## 2019-04-19 ENCOUNTER — Encounter: Payer: Self-pay | Admitting: Pulmonary Disease

## 2019-04-19 ENCOUNTER — Ambulatory Visit (INDEPENDENT_AMBULATORY_CARE_PROVIDER_SITE_OTHER): Payer: Medicare HMO | Admitting: Pulmonary Disease

## 2019-04-19 ENCOUNTER — Other Ambulatory Visit: Payer: Self-pay

## 2019-04-19 VITALS — BP 104/60 | HR 94 | Temp 97.2°F | Ht 62.0 in | Wt 119.8 lb

## 2019-04-19 DIAGNOSIS — J849 Interstitial pulmonary disease, unspecified: Secondary | ICD-10-CM | POA: Diagnosis not present

## 2019-04-19 LAB — CBC WITH DIFFERENTIAL/PLATELET
Basophils Absolute: 0.1 10*3/uL (ref 0.0–0.1)
Basophils Relative: 1 % (ref 0.0–3.0)
Eosinophils Absolute: 0.1 10*3/uL (ref 0.0–0.7)
Eosinophils Relative: 1 % (ref 0.0–5.0)
HCT: 47.5 % — ABNORMAL HIGH (ref 36.0–46.0)
Hemoglobin: 15.9 g/dL — ABNORMAL HIGH (ref 12.0–15.0)
Lymphocytes Relative: 21.2 % (ref 12.0–46.0)
Lymphs Abs: 1.6 10*3/uL (ref 0.7–4.0)
MCHC: 33.4 g/dL (ref 30.0–36.0)
MCV: 97.7 fl (ref 78.0–100.0)
Monocytes Absolute: 0.4 10*3/uL (ref 0.1–1.0)
Monocytes Relative: 5.6 % (ref 3.0–12.0)
Neutro Abs: 5.4 10*3/uL (ref 1.4–7.7)
Neutrophils Relative %: 71.2 % (ref 43.0–77.0)
Platelets: 385 10*3/uL (ref 150.0–400.0)
RBC: 4.86 Mil/uL (ref 3.87–5.11)
RDW: 13.8 % (ref 11.5–15.5)
WBC: 7.6 10*3/uL (ref 4.0–10.5)

## 2019-04-19 MED ORDER — ANORO ELLIPTA 62.5-25 MCG/INH IN AEPB: 1.0000 | INHALATION_SPRAY | Freq: Every day | RESPIRATORY_TRACT | 3 refills | Status: DC

## 2019-04-19 MED ORDER — ANORO ELLIPTA 62.5-25 MCG/INH IN AEPB: 1.0000 | INHALATION_SPRAY | Freq: Every day | RESPIRATORY_TRACT | 0 refills | Status: DC

## 2019-04-19 NOTE — Patient Instructions (Signed)
We get some labs for work-up of interstitial lung disease today Check mold antibody panel, CBC differential, IgE, Alpha 1 trypsin levels and phenotype Schedule you for high-resolution CT and pulmonary function test We will start you on an inhaler called Anoro Please work on smoking cessation Follow-up in 2 to 4 weeks.

## 2019-04-19 NOTE — Progress Notes (Signed)
Monique Swanson    [id number]    [birth year]  Primary Care Physician:Crawford, Real Cons, MD  Referring Physician: Hoyt Koch, MD [address] [place],  [place] [address]  Chief complaint: Consult for interstitial lung disease, pulmonary fibrosis  HPI: 64 year old smoker with history of ulcerative colitis, squamous cell cancer of the skin status post resection in 2014, osteoporosis  She had a CT scan in 2016 which showed evidence of interstitial lung disease, pulmonary fibrosis.  This was redemonstrated on recent chest x-ray and he has been referred for evaluation. History notable for ulcerative colitis for which she sees Dr. Alessandra Bevels at [facility] and is on budesonide with occasional prednisone tapers  Pets: Has a dog.  No cats, birds, farm animals Occupation: Currently on disability.  Used to work as a Art therapist and for optometrist Exposures: Reports that her crawlspace is damp with mold.  No hot tub, Jacuzzi or feather pillows or comforters Smoking history: 50-pack-year smoker.  Continues to smoke 1 pack/day Travel history: No significant travel history Relevant family history: Mom and sister have COPD.  They were smokers.  Outpatient Encounter Medications as of 04/19/2019  Medication Sig  . budesonide (ENTOCORT EC) 3 MG 24 hr capsule Take 3 mg by mouth 2 (two) times daily.  . cholecalciferol (VITAMIN D) 1000 UNITS tablet Take 1 tablet (1,000 Units total) by mouth daily.  Marland Kitchen dicyclomine (BENTYL) 10 MG capsule Take 10 mg by mouth 4 (four) times daily -  before meals and at bedtime.  Marland Kitchen escitalopram (LEXAPRO) 20 MG tablet Take 1 tablet (20 mg total) by mouth daily.  . folic acid (FOLVITE) 1 MG tablet Take 1 tablet (1 mg total) by mouth daily.  Marland Kitchen imipramine (TOFRANIL) 50 MG tablet TAKE 1 TABLET BY MOUTH EVERYDAY AT BEDTIME  . VENTOLIN HFA 108 (90 Base) MCG/ACT inhaler TAKE 2 PUFFS BY MOUTH EVERY 6 HOURS AS NEEDED FOR WHEEZE OR SHORTNESS OF BREATH  .  [DISCONTINUED] diclofenac sodium (VOLTAREN) 1 % GEL Apply 4 g topically 4 (four) times daily. (Patient taking differently: Apply 4 g topically 4 (four) times daily. )  . [DISCONTINUED] hydrOXYzine (ATARAX/VISTARIL) 25 MG tablet Take 1 tablet (25 mg total) by mouth every 6 (six) hours as needed for itching.  . [DISCONTINUED] pantoprazole (PROTONIX) 20 MG tablet Take 1 tablet by mouth every morning.   No facility-administered encounter medications on file as of 04/19/2019.    Allergies as of 04/19/2019 - Review Complete 04/19/2019  Allergen Reaction Noted  . Tetracycline Other (See Comments)     Past Medical History:  Diagnosis Date  . CARCINOMA, SQUAMOUS CELL 2009   s/p rescrtion from R nose bridge  . DEPRESSION   . Headache(784.0)   . Hx of cardiovascular stress test    Lex MV 6/14:  Normal, EF 80%  . Irritable bowel syndrome   . Lymphocytic colitis   . Microscopic colitis dx 02/2009   chronic diarrhea  . Osteoarthritis   . OSTEOPENIA   . Osteoporosis, unspecified 09/30/2008   LeB DEXA 05/2012: -2.7, recommended to start bisphos      Past Surgical History:  Procedure Laterality Date  . ABDOMINAL HYSTERECTOMY  1985  . APPENDECTOMY  1969  . BACK SURGERY  09/16/2014   disk replacement with fusion  . BREAST BIOPSY  1987   left  . BUNIONECTOMY Right   . FOOT SURGERY  1990   for joint in toes; bilateral  . TUBAL LIGATION  Family History  Problem Relation Age of Onset  . Dementia Mother   . Coronary artery disease Mother   . Diabetes Mother   . Asthma Father   . Ovarian cancer Sister   . Diabetes Sister   . Heart disease Sister        x   . Irritable bowel syndrome Sister   . Colon cancer Neg Hx     Social History   Socioeconomic History  . Marital status: Widowed    Spouse name: Not on file  . Number of children: 2  . Years of education: Not on file  . Highest education level: Not on file  Occupational History  . Occupation: Disabled  Tobacco Use  .  Smoking status: Current Every Day Smoker    Packs/day: 1.00    Years: 35.00    Pack years: 35.00    Types: Cigarettes  . Smokeless tobacco: Never Used  Substance and Sexual Activity  . Alcohol use: No  . Drug use: No  . Sexual activity: Not Currently  Other Topics Concern  . Not on file  Social History Narrative   Widowed 08/24/09, 2 grown children. son in [place] and dtr in [place] Determinants of Health   Financial Resource Strain:   . Difficulty of Paying Living Expenses: Not on file  Food Insecurity:   . Worried About Charity fundraiser in the Last Year: Not on file  . Ran Out of Food in the Last Year: Not on file  Transportation Needs:   . Lack of Transportation (Medical): Not on file  . Lack of Transportation (Non-Medical): Not on file  Physical Activity:   . Days of Exercise per Week: Not on file  . Minutes of Exercise per Session: Not on file  Stress:   . Feeling of Stress : Not on file  Social Connections:   . Frequency of Communication with Friends and Family: Not on file  . Frequency of Social Gatherings with Friends and Family: Not on file  . Attends Religious Services: Not on file  . Active Member of Clubs or Organizations: Not on file  . Attends Archivist Meetings: Not on file  . Marital Status: Not on file  Intimate Partner Violence:   . Fear of Current or Ex-Partner: Not on file  . Emotionally Abused: Not on file  . Physically Abused: Not on file  . Sexually Abused: Not on file    Review of systems: Review of Systems  Constitutional: Negative for fever and chills.  HENT: Negative.   Eyes: Negative for blurred vision.  Respiratory: as per HPI  Cardiovascular: Negative for chest pain and palpitations.  Gastrointestinal: Negative for vomiting, diarrhea, blood per rectum. Genitourinary: Negative for dysuria, urgency, frequency and hematuria.  Musculoskeletal: Negative for myalgias, back pain and joint pain.  Skin: Negative for  itching and rash.  Neurological: Negative for dizziness, tremors, focal weakness, seizures and loss of consciousness.  Endo/Heme/Allergies: Negative for environmental allergies.  Psychiatric/Behavioral: Negative for depression, suicidal ideas and hallucinations.  All other systems reviewed and are negative.  Physical Exam: There were no vitals taken for this visit. Gen:      No acute distress HEENT:  EOMI, sclera anicteric Neck:     No masses; no thyromegaly Lungs:    Clear to auscultation bilaterally; normal respiratory effort CV:         Regular rate and rhythm; no murmurs Abd:      + bowel sounds; soft,  non-tender; no palpable masses, no distension Ext:    No edema; adequate peripheral perfusion Skin:      Warm and dry; no rash Neuro: alert and oriented x 3 Psych: normal mood and affect  Data Reviewed: Imaging: CTA 07/31/2014-no pulmonary embolism, subpleural fibrosis with groundglass and emphysema.  Evidence of prior granulomatous exposure.  Chest x-ray 02/28/2019-chronic fibrotic changes. I have reviewed the images personally.  Assessment:  Interstitial lung disease CT reviewed with changes of pulmonary fibrosis in 2016 History notable for possible mold exposure.  Will work-up with CTD serologies, mold panel.  Schedule high-resolution CT and pulmonary function.  Discussed implications of ILD findings and work-up in detail with patient and daughter in office today  Emphysema Likely has significant COPD due to smoking Start Anoro Schedule pulmonary function tests, check CBC differential, IgE, alpha-1 antitrypsin levels and phenotype  Active smoker Smoking cessation discussed but she is not ready to quit at present.  Reassess at return visit Time spent counseling-5 minutes  Plan/Recommendations: - CTD serologies - Mold panel, CBC, IgE, alpha-1 antitrypsin - High-res CT, PFTs - Anoro - Smoking cessation  This appointment required 45 minutes of patient care (this  includes precharting, chart review, review of results, face-to-face care, etc.).  Marshell Garfinkel MD [facility] 04/19/2019, 9:09 AM  CC: Hoyt Koch, *

## 2019-04-23 LAB — HYPERSENSITIVITY PNEUMONITIS
A. Pullulans Abs: NEGATIVE
A.Fumigatus #1 Abs: NEGATIVE
Micropolyspora faeni, IgG: NEGATIVE
Pigeon Serum Abs: NEGATIVE
Thermoact. Saccharii: NEGATIVE
Thermoactinomyces vulgaris, IgG: NEGATIVE

## 2019-04-24 LAB — ALLERGEN PROFILE, MOLD
Allergen, A. alternata, m6: <0.1 kU/L
Allergen, Mucor Racemosus, M4: <0.1 kU/L
Allergen, P. notatum, m1: <0.1 kU/L
Allergen, S. Botryosum, m10: <0.1 kU/L
Aspergillus fumigatus, m3: <0.1 kU/L
CLADOSPORIUM HERBARUM (M2) IGE: <0.1 kU/L
CLASS: 0
Class: 0
Class: 0
Class: 0
Class: 0
Class: 0

## 2019-04-24 LAB — SJOGREN'S SYNDROME ANTIBODS(SSA + SSB)
SSA (Ro) (ENA) Antibody, IgG: <1 AI
SSB (La) (ENA) Antibody, IgG: <1 AI

## 2019-04-24 LAB — ANCA SCREEN W REFLEX TITER: ANCA Screen: NEGATIVE

## 2019-04-24 LAB — ANA,IFA RA DIAG PNL W/RFLX TIT/PATN
Anti Nuclear Antibody (ANA): NEGATIVE
Cyclic Citrullin Peptide Ab: <16 UNITS
Rheumatoid fact SerPl-aCnc: <14 IU/mL (ref <14)

## 2019-04-24 LAB — IGE: IgE (Immunoglobulin E), Serum: 27 kU/L (ref <=114)

## 2019-04-24 LAB — ANTI-SCLERODERMA ANTIBODY: Scleroderma (Scl-70) (ENA) Antibody, IgG: <1 AI

## 2019-04-24 LAB — ALPHA-1 ANTITRYPSIN PHENOTYPE: A-1 Antitrypsin, Ser: 139 mg/dL (ref 83–199)

## 2019-04-24 LAB — INTERPRETATION:

## 2019-04-26 DIAGNOSIS — R69 Illness, unspecified: Secondary | ICD-10-CM | POA: Diagnosis not present

## 2019-04-26 DIAGNOSIS — I25119 Atherosclerotic heart disease of native coronary artery with unspecified angina pectoris: Secondary | ICD-10-CM | POA: Diagnosis not present

## 2019-04-26 DIAGNOSIS — G8929 Other chronic pain: Secondary | ICD-10-CM | POA: Diagnosis not present

## 2019-04-26 DIAGNOSIS — K219 Gastro-esophageal reflux disease without esophagitis: Secondary | ICD-10-CM | POA: Diagnosis not present

## 2019-04-26 DIAGNOSIS — J8410 Pulmonary fibrosis, unspecified: Secondary | ICD-10-CM | POA: Diagnosis not present

## 2019-04-26 DIAGNOSIS — J449 Chronic obstructive pulmonary disease, unspecified: Secondary | ICD-10-CM | POA: Diagnosis not present

## 2019-04-26 DIAGNOSIS — R32 Unspecified urinary incontinence: Secondary | ICD-10-CM | POA: Diagnosis not present

## 2019-04-26 DIAGNOSIS — K5190 Ulcerative colitis, unspecified, without complications: Secondary | ICD-10-CM | POA: Diagnosis not present

## 2019-05-03 ENCOUNTER — Ambulatory Visit
Admission: RE | Admit: 2019-05-03 | Discharge: 2019-05-03 | Disposition: A | Discharge status: Home / Self Care | Payer: Medicare HMO | Source: Ambulatory Visit | Attending: Pulmonary Disease | Admitting: Pulmonary Disease

## 2019-05-03 ENCOUNTER — Telehealth: Payer: Self-pay | Admitting: Pulmonary Disease

## 2019-05-03 DIAGNOSIS — R918 Other nonspecific abnormal finding of lung field: Secondary | ICD-10-CM

## 2019-05-03 DIAGNOSIS — J849 Interstitial pulmonary disease, unspecified: Secondary | ICD-10-CM

## 2019-05-03 DIAGNOSIS — J8410 Pulmonary fibrosis, unspecified: Secondary | ICD-10-CM | POA: Diagnosis not present

## 2019-05-03 DIAGNOSIS — R911 Solitary pulmonary nodule: Secondary | ICD-10-CM

## 2019-05-03 NOTE — Telephone Encounter (Signed)
Received a call report on this pt for HRCT dated 05/03/19   Impression as follows:  IMPRESSION: 1. Spiculated cavitary 2.3 cm peripheral right middle lobe pulmonary nodule, new since 2016 chest CT, worrisome for primary bronchogenic carcinoma. Additional irregular solid 1.8 cm left upper lobe 1.3 cm superior segment left lower lobe pulmonary nodules, also new, possibly malignant. Recommend further characterization with PET-CT. 2. Moderate centrilobular and paraseptal emphysema with bullous emphysema in the right lower lobe, worsened. 3. Spectrum of findings suggestive of basilar predominant interstitial lung disease with minimal traction bronchiolectasis and no frank honeycombing, possibly worsened although comparison is limited, see comments. Findings are indeterminate for UIP per consensus guidelines: Diagnosis of Idiopathic Pulmonary Fibrosis: An Official ATS/ERS/JRS/ALAT Clinical Practice Guideline. [place], Iss 5, [phone], Dec 11 2016.  Aortic Atherosclerosis (ICD10-I70.0) and Emphysema (ICD10-J43.9).  These results will be called to the ordering clinician or representative by the Radiologist Assistant, and communication documented in the PACS or zVision Dashboard.   Electronically Signed   By: Ilona Sorrel M.D.   On: 05/03/2019 14:17

## 2019-05-03 NOTE — Telephone Encounter (Addendum)
I called and discussed scan with patient showing findings of bilateral pulmonary nodules suspicious for malignancy.  There is also evidence of emphysema and basal fibrosis.  Immediate priority is to work-up the lung nodules first.    Please get super D made of the CT chest Order PET CT and see if PFTs can be done sooner I will review with colleagues regarding bronchoscopic lung biopsy.  Marshell Garfinkel MD [facility] 05/03/2019, 2:56 PM

## 2019-05-04 MED ORDER — AMOXICILLIN-POT CLAVULANATE 875-125 MG PO TABS: 1.0000 | ORAL_TABLET | Freq: Two times a day (BID) | ORAL | 0 refills | Status: DC

## 2019-05-04 NOTE — Telephone Encounter (Signed)
Thanks. Also can you call and tell her we would treat with antibiotics before PET scan Call in Augmentin 875 mg bid for 7 days.

## 2019-05-04 NOTE — Telephone Encounter (Signed)
Prescription has been sent to the pharmacy and I called and spoke with her son and made him aware and to also let her know.

## 2019-05-04 NOTE — Telephone Encounter (Signed)
I have called to get a Super D disc sent to the office and placed an order for the PET scan. I will look into trying to get the PFT done sooner.

## 2019-05-07 ENCOUNTER — Telehealth: Payer: Self-pay | Admitting: Pulmonary Disease

## 2019-05-07 NOTE — Telephone Encounter (Signed)
Received disk from Dr. Matilde Bash box. Will give directly to Dr. Vaughan Browner.

## 2019-05-09 NOTE — Telephone Encounter (Signed)
Libby,  I'd like to try to set up Ms Klier for navigational bronchoscopy on 2/10 morning if possible.   Thanks, RB

## 2019-05-09 NOTE — Telephone Encounter (Signed)
Dr Lamonte Sakai I cant get this ENB on 2/10 either I am being told yet again that the time as been set aside on tuesdays for pulmonary dr's and we have to use that I am giving you Tiffany's phone#[phone] she will be back in the OR scheduling tomorrow. Also your Tuesday 2/10 is full that morning too

## 2019-05-10 LAB — MYOMARKER 3 PLUS PROFILE (RDL)

## 2019-05-10 NOTE — Telephone Encounter (Signed)
Scheduled in endoscopy

## 2019-05-16 ENCOUNTER — Other Ambulatory Visit: Payer: Self-pay

## 2019-05-16 ENCOUNTER — Encounter (HOSPITAL_COMMUNITY)
Admission: RE | Admit: 2019-05-16 | Discharge: 2019-05-16 | Disposition: A | Discharge status: Home / Self Care | Payer: Medicare HMO | Source: Ambulatory Visit | Attending: Pulmonary Disease | Admitting: Pulmonary Disease

## 2019-05-16 ENCOUNTER — Emergency Department (HOSPITAL_COMMUNITY): Admission: EM | Admit: 2019-05-16 | Discharge: 2019-05-16 | Discharge status: Absent without leave | Payer: Medicare HMO

## 2019-05-16 DIAGNOSIS — R918 Other nonspecific abnormal finding of lung field: Secondary | ICD-10-CM | POA: Insufficient documentation

## 2019-05-16 DIAGNOSIS — R911 Solitary pulmonary nodule: Secondary | ICD-10-CM | POA: Diagnosis present

## 2019-05-16 LAB — GLUCOSE, CAPILLARY: Glucose-Capillary: 103 mg/dL — ABNORMAL HIGH (ref 70–99)

## 2019-05-16 MED ORDER — FLUDEOXYGLUCOSE F - 18 (FDG) INJECTION
5.9200 | Freq: Once | INTRAVENOUS | Status: AC | PRN
  Administered 2019-05-16: 5.92 via INTRAVENOUS

## 2019-05-25 ENCOUNTER — Encounter (HOSPITAL_COMMUNITY): Payer: Self-pay | Admitting: Emergency Medicine

## 2019-05-25 ENCOUNTER — Other Ambulatory Visit: Payer: Self-pay

## 2019-05-25 NOTE — Progress Notes (Signed)
Monique Swanson denies chest pain and is not currently having shortness of breath. Monique. [place]son denies any symptoms of Covid or being around anyone with symptoms.  Patient is scheduled for Covid test 05/26/2019

## 2019-05-26 ENCOUNTER — Other Ambulatory Visit (HOSPITAL_COMMUNITY)
Admission: RE | Admit: 2019-05-26 | Discharge: 2019-05-26 | Disposition: A | Discharge status: Home / Self Care | Payer: Medicare HMO | Source: Ambulatory Visit | Attending: Emergency Medicine | Admitting: Emergency Medicine

## 2019-05-26 DIAGNOSIS — Z20822 Contact with and (suspected) exposure to covid-19: Secondary | ICD-10-CM | POA: Insufficient documentation

## 2019-05-26 DIAGNOSIS — Z01812 Encounter for preprocedural laboratory examination: Secondary | ICD-10-CM | POA: Insufficient documentation

## 2019-05-26 LAB — SARS CORONAVIRUS 2 (TAT 6-24 HRS): SARS Coronavirus 2: NEGATIVE

## 2019-05-29 ENCOUNTER — Observation Stay (HOSPITAL_COMMUNITY): Payer: Medicare HMO

## 2019-05-29 ENCOUNTER — Observation Stay (HOSPITAL_COMMUNITY)
Admission: RE | Admit: 2019-05-29 | Discharge: 2019-05-30 | Disposition: A | Discharge status: Home / Self Care | Payer: Medicare HMO | Attending: Emergency Medicine | Admitting: Emergency Medicine

## 2019-05-29 ENCOUNTER — Other Ambulatory Visit: Payer: Self-pay

## 2019-05-29 ENCOUNTER — Encounter (HOSPITAL_COMMUNITY)
Admission: RE | Disposition: A | Discharge status: Home / Self Care | Payer: Self-pay | Source: Home / Self Care | Attending: Emergency Medicine

## 2019-05-29 ENCOUNTER — Ambulatory Visit (HOSPITAL_COMMUNITY): Payer: Medicare HMO

## 2019-05-29 ENCOUNTER — Encounter (HOSPITAL_COMMUNITY): Payer: Self-pay | Admitting: Emergency Medicine

## 2019-05-29 ENCOUNTER — Ambulatory Visit (HOSPITAL_COMMUNITY): Payer: Medicare HMO | Admitting: Certified Registered"

## 2019-05-29 DIAGNOSIS — R69 Illness, unspecified: Secondary | ICD-10-CM | POA: Diagnosis not present

## 2019-05-29 DIAGNOSIS — M1990 Unspecified osteoarthritis, unspecified site: Secondary | ICD-10-CM | POA: Insufficient documentation

## 2019-05-29 DIAGNOSIS — Z7952 Long term (current) use of systemic steroids: Secondary | ICD-10-CM | POA: Insufficient documentation

## 2019-05-29 DIAGNOSIS — F4310 Post-traumatic stress disorder, unspecified: Secondary | ICD-10-CM | POA: Diagnosis not present

## 2019-05-29 DIAGNOSIS — J939 Pneumothorax, unspecified: Secondary | ICD-10-CM

## 2019-05-29 DIAGNOSIS — Z8249 Family history of ischemic heart disease and other diseases of the circulatory system: Secondary | ICD-10-CM | POA: Insufficient documentation

## 2019-05-29 DIAGNOSIS — J449 Chronic obstructive pulmonary disease, unspecified: Secondary | ICD-10-CM | POA: Diagnosis not present

## 2019-05-29 DIAGNOSIS — Z9889 Other specified postprocedural states: Secondary | ICD-10-CM

## 2019-05-29 DIAGNOSIS — Z842 Family history of other diseases of the genitourinary system: Secondary | ICD-10-CM | POA: Insufficient documentation

## 2019-05-29 DIAGNOSIS — Z79899 Other long term (current) drug therapy: Secondary | ICD-10-CM | POA: Insufficient documentation

## 2019-05-29 DIAGNOSIS — M8580 Other specified disorders of bone density and structure, unspecified site: Secondary | ICD-10-CM | POA: Diagnosis not present

## 2019-05-29 DIAGNOSIS — K219 Gastro-esophageal reflux disease without esophagitis: Secondary | ICD-10-CM | POA: Diagnosis not present

## 2019-05-29 DIAGNOSIS — Z8041 Family history of malignant neoplasm of ovary: Secondary | ICD-10-CM | POA: Insufficient documentation

## 2019-05-29 DIAGNOSIS — R918 Other nonspecific abnormal finding of lung field: Secondary | ICD-10-CM

## 2019-05-29 DIAGNOSIS — F419 Anxiety disorder, unspecified: Secondary | ICD-10-CM | POA: Insufficient documentation

## 2019-05-29 DIAGNOSIS — K5190 Ulcerative colitis, unspecified, without complications: Secondary | ICD-10-CM | POA: Diagnosis not present

## 2019-05-29 DIAGNOSIS — M810 Age-related osteoporosis without current pathological fracture: Secondary | ICD-10-CM | POA: Diagnosis not present

## 2019-05-29 DIAGNOSIS — Z881 Allergy status to other antibiotic agents status: Secondary | ICD-10-CM | POA: Insufficient documentation

## 2019-05-29 DIAGNOSIS — F329 Major depressive disorder, single episode, unspecified: Secondary | ICD-10-CM | POA: Insufficient documentation

## 2019-05-29 DIAGNOSIS — R634 Abnormal weight loss: Secondary | ICD-10-CM | POA: Insufficient documentation

## 2019-05-29 DIAGNOSIS — F17210 Nicotine dependence, cigarettes, uncomplicated: Secondary | ICD-10-CM | POA: Insufficient documentation

## 2019-05-29 DIAGNOSIS — J849 Interstitial pulmonary disease, unspecified: Secondary | ICD-10-CM | POA: Insufficient documentation

## 2019-05-29 DIAGNOSIS — R519 Headache, unspecified: Secondary | ICD-10-CM | POA: Insufficient documentation

## 2019-05-29 DIAGNOSIS — Z90710 Acquired absence of both cervix and uterus: Secondary | ICD-10-CM | POA: Insufficient documentation

## 2019-05-29 DIAGNOSIS — R0609 Other forms of dyspnea: Secondary | ICD-10-CM | POA: Diagnosis not present

## 2019-05-29 DIAGNOSIS — Z6821 Body mass index (BMI) 21.0-21.9, adult: Secondary | ICD-10-CM | POA: Diagnosis not present

## 2019-05-29 DIAGNOSIS — R911 Solitary pulmonary nodule: Secondary | ICD-10-CM | POA: Diagnosis not present

## 2019-05-29 DIAGNOSIS — C342 Malignant neoplasm of middle lobe, bronchus or lung: Secondary | ICD-10-CM | POA: Diagnosis not present

## 2019-05-29 DIAGNOSIS — Z833 Family history of diabetes mellitus: Secondary | ICD-10-CM | POA: Diagnosis not present

## 2019-05-29 DIAGNOSIS — Z85828 Personal history of other malignant neoplasm of skin: Secondary | ICD-10-CM | POA: Insufficient documentation

## 2019-05-29 DIAGNOSIS — J95811 Postprocedural pneumothorax: Principal | ICD-10-CM | POA: Diagnosis present

## 2019-05-29 DIAGNOSIS — Z820 Family history of epilepsy and other diseases of the nervous system: Secondary | ICD-10-CM | POA: Insufficient documentation

## 2019-05-29 DIAGNOSIS — D0221 Carcinoma in situ of right bronchus and lung: Secondary | ICD-10-CM | POA: Diagnosis not present

## 2019-05-29 DIAGNOSIS — R05 Cough: Secondary | ICD-10-CM | POA: Diagnosis not present

## 2019-05-29 HISTORY — DX: Anxiety disorder, unspecified: F41.9

## 2019-05-29 HISTORY — DX: Gastro-esophageal reflux disease without esophagitis: K21.9

## 2019-05-29 HISTORY — PX: VIDEO BRONCHOSCOPY WITH ENDOBRONCHIAL NAVIGATION: SHX6175

## 2019-05-29 HISTORY — DX: Chronic obstructive pulmonary disease, unspecified: J44.9

## 2019-05-29 HISTORY — PX: VIDEO BRONCHOSCOPY: SHX5072

## 2019-05-29 HISTORY — PX: BRONCHIAL NEEDLE ASPIRATION BIOPSY: SHX5106

## 2019-05-29 HISTORY — PX: LUNG BIOPSY: SHX5088

## 2019-05-29 HISTORY — DX: Dyspnea, unspecified: R06.00

## 2019-05-29 HISTORY — PX: BRONCHIAL BRUSHINGS: SHX5108

## 2019-05-29 HISTORY — DX: Post-traumatic stress disorder, unspecified: F43.10

## 2019-05-29 LAB — COMPREHENSIVE METABOLIC PANEL
ALT: 22 U/L (ref 0–44)
AST: 27 U/L (ref 15–41)
Albumin: 3.4 g/dL — ABNORMAL LOW (ref 3.5–5.0)
Alkaline Phosphatase: 90 U/L (ref 38–126)
Anion gap: 12 (ref 5–15)
BUN: 13 mg/dL (ref 8–23)
CO2: 23 mmol/L (ref 22–32)
Calcium: 9.1 mg/dL (ref 8.9–10.3)
Chloride: 102 mmol/L (ref 98–111)
Creatinine, Ser: 0.91 mg/dL (ref 0.44–1.00)
GFR calc Af Amer: >60 mL/min (ref >60)
GFR calc non Af Amer: >60 mL/min (ref >60)
Glucose, Bld: 90 mg/dL (ref 70–99)
Potassium: 4.5 mmol/L (ref 3.5–5.1)
Sodium: 137 mmol/L (ref 135–145)
Total Bilirubin: 0.6 mg/dL (ref 0.3–1.2)
Total Protein: 6.4 g/dL — ABNORMAL LOW (ref 6.5–8.1)

## 2019-05-29 LAB — CBC
HCT: 46.3 % — ABNORMAL HIGH (ref 36.0–46.0)
Hemoglobin: 15 g/dL (ref 12.0–15.0)
MCH: 32.2 pg (ref 26.0–34.0)
MCHC: 32.4 g/dL (ref 30.0–36.0)
MCV: 99.4 fL (ref 80.0–100.0)
Platelets: 368 10*3/uL (ref 150–400)
RBC: 4.66 MIL/uL (ref 3.87–5.11)
RDW: 13.7 % (ref 11.5–15.5)
WBC: 7.4 10*3/uL (ref 4.0–10.5)
nRBC: 0 % (ref 0.0–0.2)

## 2019-05-29 LAB — HIV ANTIBODY (ROUTINE TESTING W REFLEX): HIV Screen 4th Generation wRfx: NONREACTIVE

## 2019-05-29 LAB — PROTIME-INR
INR: 0.9 (ref 0.8–1.2)
Prothrombin Time: 11.9 seconds (ref 11.4–15.2)

## 2019-05-29 LAB — APTT: aPTT: 28 seconds (ref 24–36)

## 2019-05-29 SURGERY — VIDEO BRONCHOSCOPY WITH ENDOBRONCHIAL NAVIGATION
Anesthesia: General

## 2019-05-29 MED ORDER — ALBUTEROL SULFATE HFA 108 (90 BASE) MCG/ACT IN AERS: 2.0000 | INHALATION_SPRAY | Freq: Four times a day (QID) | RESPIRATORY_TRACT | Status: DC | PRN

## 2019-05-29 MED ORDER — BUDESONIDE 3 MG PO CPEP
3.0000 mg | ORAL_CAPSULE | Freq: Every day | ORAL | Status: DC
  Administered 2019-05-29: 3 mg via ORAL
  Filled 2019-05-29: qty 1

## 2019-05-29 MED ORDER — ALBUTEROL SULFATE (2.5 MG/3ML) 0.083% IN NEBU: 2.5000 mg | INHALATION_SOLUTION | Freq: Four times a day (QID) | RESPIRATORY_TRACT | Status: DC | PRN

## 2019-05-29 MED ORDER — ESCITALOPRAM OXALATE 20 MG PO TABS
20.0000 mg | ORAL_TABLET | Freq: Every day | ORAL | Status: DC
  Administered 2019-05-29: 17:00:00 20 mg via ORAL
  Filled 2019-05-29 (×2): qty 1

## 2019-05-29 MED ORDER — PHENYLEPHRINE HCL (PRESSORS) 10 MG/ML IV SOLN
INTRAVENOUS | Status: DC | PRN
  Administered 2019-05-29 (×2): 80 ug via INTRAVENOUS

## 2019-05-29 MED ORDER — FOLIC ACID 1 MG PO TABS
1.0000 mg | ORAL_TABLET | Freq: Every day | ORAL | Status: DC
  Administered 2019-05-29: 1 mg via ORAL
  Filled 2019-05-29 (×2): qty 1

## 2019-05-29 MED ORDER — SODIUM CHLORIDE 0.9 % IV SOLN: INTRAVENOUS | Status: DC

## 2019-05-29 MED ORDER — DICYCLOMINE HCL 10 MG PO CAPS
10.0000 mg | ORAL_CAPSULE | Freq: Three times a day (TID) | ORAL | Status: DC
  Administered 2019-05-29 – 2019-05-30 (×3): 10 mg via ORAL
  Filled 2019-05-29 (×3): qty 1

## 2019-05-29 MED ORDER — MEPERIDINE HCL 100 MG/ML IJ SOLN: 6.2500 mg | INTRAMUSCULAR | Status: DC | PRN

## 2019-05-29 MED ORDER — ENSURE ENLIVE PO LIQD: 237.0000 mL | Freq: Two times a day (BID) | ORAL | Status: DC

## 2019-05-29 MED ORDER — HYDROMORPHONE HCL 1 MG/ML IJ SOLN: 0.2500 mg | INTRAMUSCULAR | Status: DC | PRN

## 2019-05-29 MED ORDER — VITAMIN D 25 MCG (1000 UNIT) PO TABS
1000.0000 [IU] | ORAL_TABLET | Freq: Every day | ORAL | Status: DC
  Administered 2019-05-29: 1000 [IU] via ORAL
  Filled 2019-05-29: qty 1

## 2019-05-29 MED ORDER — LIDOCAINE 2% (20 MG/ML) 5 ML SYRINGE
INTRAMUSCULAR | Status: DC | PRN
  Administered 2019-05-29: 50 mg via INTRAVENOUS

## 2019-05-29 MED ORDER — ONDANSETRON HCL 4 MG/2ML IJ SOLN
INTRAMUSCULAR | Status: DC | PRN
  Administered 2019-05-29: 4 mg via INTRAVENOUS

## 2019-05-29 MED ORDER — FENTANYL CITRATE (PF) 100 MCG/2ML IJ SOLN
INTRAMUSCULAR | Status: DC | PRN
  Administered 2019-05-29: 100 ug via INTRAVENOUS

## 2019-05-29 MED ORDER — IMIPRAMINE HCL 25 MG PO TABS
50.0000 mg | ORAL_TABLET | Freq: Every day | ORAL | Status: DC
  Administered 2019-05-29: 50 mg via ORAL
  Filled 2019-05-29: qty 2

## 2019-05-29 MED ORDER — PROPOFOL 10 MG/ML IV BOLUS
INTRAVENOUS | Status: DC | PRN
  Administered 2019-05-29: 150 mg via INTRAVENOUS

## 2019-05-29 MED ORDER — MIDAZOLAM HCL 5 MG/5ML IJ SOLN
INTRAMUSCULAR | Status: DC | PRN
  Administered 2019-05-29: 2 mg via INTRAVENOUS

## 2019-05-29 MED ORDER — OXYCODONE HCL 5 MG/5ML PO SOLN: 5.0000 mg | Freq: Once | ORAL | Status: DC | PRN

## 2019-05-29 MED ORDER — ROCURONIUM BROMIDE 10 MG/ML (PF) SYRINGE
PREFILLED_SYRINGE | INTRAVENOUS | Status: DC | PRN
  Administered 2019-05-29: 60 mg via INTRAVENOUS
  Administered 2019-05-29: 10 mg via INTRAVENOUS

## 2019-05-29 MED ORDER — ACETAMINOPHEN 500 MG PO TABS
1000.0000 mg | ORAL_TABLET | Freq: Four times a day (QID) | ORAL | Status: DC | PRN
  Administered 2019-05-29 – 2019-05-30 (×2): 1000 mg via ORAL
  Filled 2019-05-29 (×3): qty 2

## 2019-05-29 MED ORDER — OXYCODONE HCL 5 MG PO TABS: 5.0000 mg | ORAL_TABLET | Freq: Once | ORAL | Status: DC | PRN

## 2019-05-29 MED ORDER — UMECLIDINIUM-VILANTEROL 62.5-25 MCG/INH IN AEPB
1.0000 | INHALATION_SPRAY | Freq: Every day | RESPIRATORY_TRACT | Status: DC
  Filled 2019-05-29: qty 14

## 2019-05-29 MED ORDER — PROMETHAZINE HCL 25 MG/ML IJ SOLN: 6.2500 mg | INTRAMUSCULAR | Status: DC | PRN

## 2019-05-29 MED ORDER — LACTATED RINGERS IV SOLN: INTRAVENOUS | Status: DC

## 2019-05-29 MED ORDER — DEXAMETHASONE SODIUM PHOSPHATE 10 MG/ML IJ SOLN
INTRAMUSCULAR | Status: DC | PRN
  Administered 2019-05-29: 4 mg via INTRAVENOUS

## 2019-05-29 MED ORDER — SUGAMMADEX SODIUM 200 MG/2ML IV SOLN
INTRAVENOUS | Status: DC | PRN
  Administered 2019-05-29: 200 mg via INTRAVENOUS

## 2019-05-29 NOTE — Anesthesia Procedure Notes (Signed)
Procedure Name: Intubation Date/Time: 05/29/2019 10:20 AM Performed by: Moshe Salisbury, CRNA Pre-anesthesia Checklist: Patient identified, Emergency Drugs available, Suction available and Patient being monitored Patient Re-evaluated:Patient Re-evaluated prior to induction Oxygen Delivery Method: Circle System Utilized Preoxygenation: Pre-oxygenation with 100% oxygen Induction Type: IV induction Ventilation: Mask ventilation without difficulty Laryngoscope Size: Mac and 3 Grade View: Grade II Tube type: Oral Tube size: 9.0 mm Number of attempts: 1 Placement Confirmation: ETT inserted through vocal cords under direct vision,  positive ETCO2 and breath sounds checked- equal and bilateral Secured at: 18 cm Tube secured with: Tape Dental Injury: Teeth and Oropharynx as per pre-operative assessment

## 2019-05-29 NOTE — Discharge Instructions (Signed)
Flexible Bronchoscopy, Care After This sheet gives you information about how to care for yourself after your test. Your doctor may also give you more specific instructions. If you have problems or questions, contact your doctor. Follow these instructions at home: Eating and drinking  Do not eat or drink anything (not even water) for 2 hours after your test, or until your numbing medicine (local anesthetic) wears off.  When your numbness is gone and your cough and gag reflexes have come back, you may: ? Eat only soft foods. ? Slowly drink liquids.  The day after the test, go back to your normal diet. Driving  Do not drive for 24 hours if you were given a medicine to help you relax (sedative).  Do not drive or use heavy machinery while taking prescription pain medicine. General instructions   Take over-the-counter and prescription medicines only as told by your doctor.  Return to your normal activities as told. Ask what activities are safe for you.  Do not use any products that have nicotine or tobacco in them. This includes cigarettes and e-cigarettes. If you need help quitting, ask your doctor.  Keep all follow-up visits as told by your doctor. This is important. It is very important if you had a tissue sample (biopsy) taken. Get help right away if:  You have shortness of breath that gets worse.  You get light-headed.  You feel like you are going to pass out (faint).  You have chest pain.  You cough up: ? More than a little blood. ? More blood than before. Summary  Do not eat or drink anything (not even water) for 2 hours after your test, or until your numbing medicine wears off.  Do not use cigarettes. Do not use e-cigarettes.  Get help right away if you have chest pain.  Please call our office for any questions or concerns.  [phone].  This information is not intended to replace advice given to you by your health care provider. Make sure you discuss any  questions you have with your health care provider. Document Revised: 03/11/2017 Document Reviewed: 04/16/2016 Elsevier Patient Education  2020 [employer].

## 2019-05-29 NOTE — Op Note (Signed)
Video Bronchoscopy with Electromagnetic Navigation Procedure Note  Date of Operation: 05/29/2019  Pre-op Diagnosis: Pulmonary nodules in the right middle lobe, left upper lobe, left lower lobe  Post-op Diagnosis: Same  Surgeon: Baltazar Apo  Assistants: None  Anesthesia: General endotracheal anesthesia  Operation: Flexible video fiberoptic bronchoscopy with electromagnetic navigation and biopsies.  Estimated Blood Loss: Minimal  Complications: None apparent  Indications and History: Monique Swanson is a 64 y.o. female with history of tobacco use.  She was found to have pulmonary nodules with some evidence of cavitation in the right middle lobe, left upper lobe and left lower lobe when she was undergoing CT scan to evaluate possible interstitial lung disease.  Recommendation was made to achieve a tissue diagnosis via navigational bronchoscopy.  The risks, benefits, complications, treatment options and expected outcomes were discussed with the patient.  The possibilities of pneumothorax, pneumonia, reaction to medication, pulmonary aspiration, perforation of a viscus, bleeding, failure to diagnose a condition and creating a complication requiring transfusion or operation were discussed with the patient who freely signed the consent.    Description of Procedure: The patient was seen in the Preoperative Area, was examined and was deemed appropriate to proceed.  The patient was taken to [facility] endoscopy, identified as [employer] and the procedure verified as Flexible Video Fiberoptic Bronchoscopy.  A Time Out was held and the above information confirmed.   Prior to the date of the procedure a high-resolution CT scan of the chest was performed. Utilizing [place] a virtual tracheobronchial tree was generated to allow the creation of distinct navigation pathways to the patient's parenchymal abnormalities. After being taken to the operating room general anesthesia was initiated and  the patient  was orally intubated. The video fiberoptic bronchoscope was introduced via the endotracheal tube and a general inspection was performed which showed normal airways throughout.  There were no endobronchial lesions or abnormal secretions seen. The extendable working channel and locator guide were introduced into the bronchoscope. The distinct navigation pathways prepared prior to this procedure were then utilized to navigate to within 0.5 cm of the center of patient's right middle lobe and left upper lobe lesions identified on CT scan.  Navigation to the left lower lobe nodule was attempted but the lesion could not be reached and this was abandoned.  At each location the extendable working channel was secured into place and the locator guide was withdrawn. Under fluoroscopic guidance transbronchial needle brushings, transbronchial Wang needle biopsies, and transbronchial forceps biopsies were performed to be sent for cytology and pathology.  Endobronchial brushings were performed in the right mainstem bronchus to facilitate Percepta testing.  At the end of the procedure a general airway inspection was performed and there was no evidence of active bleeding. The bronchoscope was removed.  The patient tolerated the procedure well. There was no significant blood loss and there were no obvious complications. A post-procedural chest x-ray is pending.  Samples: 1. Transbronchial needle brushings from right middle lobe nodule 2. Transbronchial Wang needle biopsies from right middle lobe nodule 3. Transbronchial forceps biopsies from right middle lobe nodule 4. Transbronchial needle brushings from left upper lobe nodule 5. Transbronchial Wang needle biopsies from left upper lobe nodule 6. Transbronchial forceps biopsies from left upper lobe nodule 7.  Endobronchial brushings from the right mainstem bronchus   Plans:  The patient will be discharged from the PACU to home when recovered from anesthesia  and after chest x-ray is reviewed. We will review the cytology, pathology and  microbiology results with the patient when they become available. Outpatient followup will be with Dr Vaughan Browner.     Baltazar Apo, MD, PhD 05/29/2019, 11:49 AM [facility] [phone] or if no answer [phone]

## 2019-05-29 NOTE — Transfer of Care (Signed)
Immediate Anesthesia Transfer of Care Note  Patient: [employer]  Procedure(s) Performed: VIDEO BRONCHOSCOPY WITH ENDOBRONCHIAL NAVIGATION (N/A ) BRONCHIAL BRUSHINGS BRONCHIAL NEEDLE ASPIRATION BIOPSIES LUNG BIOPSY  Patient Location: PACU  Anesthesia Type:General  Level of Consciousness: awake, alert , oriented and patient cooperative  Airway & Oxygen Therapy: Patient Spontanous Breathing and Patient connected to nasal cannula oxygen  Post-op Assessment: Report given to RN and Post -op Vital signs reviewed and stable  Post vital signs: Reviewed and stable  Last Vitals:  Vitals Value Taken Time  BP 99/62 05/29/19 1151  Temp    Pulse 88 05/29/19 1152  Resp 19 05/29/19 1152  SpO2 98 % 05/29/19 1152  Vitals shown include unvalidated device data.  Last Pain:  Vitals:   05/29/19 0745  TempSrc:   PainSc: 0-No pain      Patients Stated Pain Goal: 4 (50/38/88 2800)  Complications: No apparent anesthesia complications

## 2019-05-29 NOTE — H&P (Signed)
Monique Swanson is an 64 y.o. female.   Chief Complaint: pulmonary nodules HPI: 64 year old smoker with a history of squamous cell carcinoma of the skin, COPD, ulcerative colitis.  Seen by Dr. Vaughan Browner in our office for Possible interstitial lung disease. A CT scan of her chest done on 05/03/2019 showed evidence for bilateral pulmonary nodules with a cavitary component.  She was treated empirically with antibiotics and then a repeat PET scan was done 05/16/2019 which showed persistence of the nodular changes and associated hypermetabolism concerning for primary lung carcinoma.  She is referred now for bronchoscopy and further evaluation of the nodular disease.  She reports that she has daily cough, has been worse lately especially at night.  Productive of clear phlegm.  She has not seen any hemoptysis.  She has about 15 pounds of unintended weight loss since the fall 2020.  Denies any chest pain.  She has stable exertional dyspnea.  Past Medical History:  Diagnosis Date  . Anxiety   . CARCINOMA, SQUAMOUS CELL 2009   s/p rescrtion from R nose bridge  . COPD (chronic obstructive pulmonary disease) ([place])   . DEPRESSION   . Dyspnea   . GERD (gastroesophageal reflux disease)   . Headache(784.0)    migraine- in past rare now- (05/24/2019)  . Hx of cardiovascular stress test    Lex MV 6/14:  Normal, EF 80%  . Irritable bowel syndrome   . Lymphocytic colitis   . Microscopic colitis dx 02/2009   chronic diarrhea  . Osteoarthritis    ostroporsis  . OSTEOPENIA   . Osteoporosis, unspecified 09/30/2008   LeB DEXA 05/2012: -2.7, recommended to start bisphos    . PTSD (post-traumatic stress disorder)     Past Surgical History:  Procedure Laterality Date  . ABDOMINAL HYSTERECTOMY  1985  . APPENDECTOMY  1969  . BACK SURGERY  09/16/2014   disk replacement with fusion  . BREAST BIOPSY  1987   left  . BUNIONECTOMY Right   . FOOT SURGERY Bilateral 1990   for joint in toes; bilateral  . TUBAL LIGATION       Family History  Problem Relation Age of Onset  . Dementia Mother   . Coronary artery disease Mother   . Diabetes Mother   . Asthma Father   . Ovarian cancer Sister   . Diabetes Sister   . Heart disease Sister        x   . Irritable bowel syndrome Sister   . Colon cancer Neg Hx    Social History:  reports that she has been smoking cigarettes. She has a 35.00 pack-year smoking history. She has never used smokeless tobacco. She reports that she does not drink alcohol or use drugs.  Allergies:  Allergies  Allergen Reactions  . Tetracycline Other (See Comments)    Ulcers on tongue    Medications Prior to Admission  Medication Sig Dispense Refill  . acetaminophen (TYLENOL) 500 MG tablet Take 1,000 mg by mouth every 6 (six) hours as needed for mild pain.     . budesonide (ENTOCORT EC) 3 MG 24 hr capsule Take 3 mg by mouth at bedtime.     . cholecalciferol (VITAMIN D) 1000 UNITS tablet Take 1 tablet (1,000 Units total) by mouth daily. (Patient taking differently: Take 1,000 Units by mouth at bedtime. )    . dicyclomine (BENTYL) 10 MG capsule Take 10 mg by mouth 4 (four) times daily -  before meals and at bedtime.    Marland Kitchen escitalopram (  LEXAPRO) 20 MG tablet Take 1 tablet (20 mg total) by mouth daily. 90 tablet 3  . folic acid (FOLVITE) 1 MG tablet Take 1 tablet (1 mg total) by mouth daily. 90 tablet 3  . imipramine (TOFRANIL) 50 MG tablet TAKE 1 TABLET BY MOUTH EVERYDAY AT BEDTIME (Patient taking differently: Take 50 mg by mouth at bedtime. ) 90 tablet 3  . umeclidinium-vilanterol (ANORO ELLIPTA) 62.5-25 MCG/INH AEPB Inhale 1 puff into the lungs daily. 7 each 0  . VENTOLIN HFA 108 (90 Base) MCG/ACT inhaler TAKE 2 PUFFS BY MOUTH EVERY 6 HOURS AS NEEDED FOR WHEEZE OR SHORTNESS OF BREATH (Patient taking differently: Inhale 2 puffs into the lungs every 6 (six) hours as needed for wheezing or shortness of breath. ) 18 g 2    Results for orders placed or performed during the hospital encounter  of 05/29/19 (from the past 48 hour(s))  Protime-INR     Status: None   Collection Time: 05/29/19  8:10 AM  Result Value Ref Range   Prothrombin Time 11.9 11.4 - 15.2 seconds   INR 0.9 0.8 - 1.2    Comment: (NOTE) INR goal varies based on device and disease states. Performed at [facility], [place] [address] [place], [place] [address]   APTT     Status: None   Collection Time: 05/29/19  8:10 AM  Result Value Ref Range   aPTT 28 24 - 36 seconds    Comment: Performed at [facility] [address] [place], [place] [address]   No results found.  Review of Systems  Blood pressure (!) 112/41, pulse 92, temperature 98.4 F (36.9 C), temperature source Oral, resp. rate 18, height 5' 2"  (1.575 m), weight 54 kg, SpO2 98 %. Physical Exam  Gen: Pleasant, thin, in no distress,  normal affect  ENT: No lesions,  mouth clear,  oropharynx clear, no postnasal drip, M1 airway  Neck: No JVD, no stridor  Lungs: No use of accessory muscles, somewhat distant, no wheezing, no crackles  Cardiovascular: RRR, heart sounds normal, no murmur or gallops, no peripheral edema  Abdomen: soft and NT, no HSM,  BS normal  Musculoskeletal: No deformities, no cyanosis or clubbing  Neuro: alert, awake, non focal  Skin: Warm, no lesions or rash   Assessment/Plan Abnormal CT scan of the chest with a cavitary right middle lobe nodule, left upper lobe and left lower lobe nodules.  Appearance is concerning for synchronous primary lung cancers.  Small hypermetabolic bilateral hilar and mediastinal lymph nodes also present on PET scan.  Plan for navigational bronchoscopy, possible endobronchial ultrasound to evaluate the pulmonary nodules as well as the mediastinal adenopathy.  Patient understands the risks, benefits, rationale.  All questions answered.  She agrees to proceed.  No barriers identified.   Collene Gobble, MD 05/29/2019, 8:48 AM

## 2019-05-29 NOTE — Progress Notes (Signed)
PCCM:  Repeat chest x-ray reviewed.  Stable pneumothorax. Continue oxygen therapy.  Maintain O2 sats at 100% Admit to the hospital. H&P completed please see documentation by Dr. Lamonte Sakai.  This case discussed with Dr. Lamonte Sakai. Pulmonary consult service will follow tomorrow.  Repeat chest x-ray in the a.m.  [facility] 05/29/2019 4:05 PM

## 2019-05-29 NOTE — Progress Notes (Signed)
Patient asked nurse if she could have pain medication for her back pain and something to help slow down bowel movements. She says her irritable bowel syndrome causes her to have diarrhea. MD paged for patient needs. Waiting for response. Will continue to monitor.

## 2019-05-29 NOTE — H&P (Signed)
NAME:  Monique Swanson, MRN:  [id number], DOB:  Jul 31, 1955, LOS: 0 ADMISSION DATE:  05/29/2019,  CHIEF COMPLAINT: Right iatrogenic pneumothorax  Brief History   64 year old woman, history of tobacco, status post bronchoscopy 5/63 complicated by right pneumothorax  History of present illness   64 year old woman with a history of tobacco use, COPD, irritable bowel syndrome, ILD under evaluation by Dr. Vaughan Browner.  She was found to have right middle lobe, right upper lobe and right lower lobe nodules on CT scan of the chest, hypermetabolic on PET scan.  She underwent navigational bronchoscopy on 2/16.  Chest x-ray post procedure revealed a small right pneumothorax.  She will be admitted for observation and tube thoracostomy if necessary.  Past Medical History   Past Medical History:  Diagnosis Date  . Anxiety   . CARCINOMA, SQUAMOUS CELL 2009   s/p rescrtion from R nose bridge  . COPD (chronic obstructive pulmonary disease) ([place])   . DEPRESSION   . Dyspnea   . GERD (gastroesophageal reflux disease)   . Headache(784.0)    migraine- in past rare now- (05/24/2019)  . Hx of cardiovascular stress test    Lex MV 6/14:  Normal, EF 80%  . Irritable bowel syndrome   . Lymphocytic colitis   . Microscopic colitis dx 02/2009   chronic diarrhea  . Osteoarthritis    ostroporsis  . OSTEOPENIA   . Osteoporosis, unspecified 09/30/2008   LeB DEXA 05/2012: -2.7, recommended to start bisphos    . PTSD (post-traumatic stress disorder)      Significant Hospital Events   Bronchoscopy 8/93, complicated by pneumothorax  Consults:    Procedures:  Bronchoscopy 2/16   Significant Diagnostic Tests:    Micro Data:    Antimicrobials:     Interim history/subjective:  Patient is breathing comfortably.  She does have some lower right thorax/back pain  Objective   Blood pressure 107/71, pulse 82, temperature 97.9 F (36.6 C), resp. rate (!) 21, height 5' 2"  (1.575 m), weight 54 kg, SpO2 99 %.         No intake or output data in the 24 hours ending 05/29/19 1412 Filed Weights   05/25/19 0929 05/29/19 0720  Weight: 54 kg 54 kg    Examination: General: Chronically ill-appearing woman, no distress on oxygen HENT: Oropharynx clear Lungs: Good air movement bilaterally, scattered rhonchi Cardiovascular: Regular, no murmur Abdomen: Soft, nondistended, positive bowel sounds Extremities: No edema Neuro: Awake, alert, interacting appropriately.  Recovered anesthesia.  Moves all extremities, nonfocal  Resolved Hospital Problem list     Assessment & Plan:  Iatrogenic right pneumothorax -Admit for observation -Repeat chest x-ray in 2 hours.  If pneumothorax is increasing then she will need a pigtail tube thoracostomy.  She understands the plan  Bilateral pulmonary nodules, concerning for malignancy.  Preliminary cytology on right middle lobe nodule suggestive primary lung cancer.  Final results pending -Follow final pathology results  History of COPD - On Anoro, Ventolin as needed  Irritable bowel -Bentyl, Entocort 3 times daily -Phenergan as needed  Depression -Continue Lexapro    Best practice:  Diet: Regular Pain/Anxiety/Delirium protocol (if indicated): N/A VAP protocol (if indicated): N/A DVT prophylaxis: SCD GI prophylaxis: N/A Glucose control: N/A Mobility: Bedrest, out of bed with assist Code Status: Full Family Communication: Discussed with the patient's daughter and the waiting area 2/16 Disposition: MedSurg  Labs   CBC: Recent Labs  Lab 05/29/19 0810  WBC 7.4  HGB 15.0  HCT 46.3*  MCV 99.4  PLT  175    Basic Metabolic Panel: Recent Labs  Lab 05/29/19 0810  NA 137  K 4.5  CL 102  CO2 23  GLUCOSE 90  BUN 13  CREATININE 0.91  CALCIUM 9.1   GFR: Estimated Creatinine Clearance: 50 mL/min (by C-G formula based on SCr of 0.91 mg/dL). Recent Labs  Lab 05/29/19 0810  WBC 7.4    Liver Function Tests: Recent Labs  Lab 05/29/19 0810  AST 27   ALT 22  ALKPHOS 90  BILITOT 0.6  PROT 6.4*  ALBUMIN 3.4*   No results for input(s): LIPASE, AMYLASE in the last 168 hours. No results for input(s): AMMONIA in the last 168 hours.  ABG No results found for: PHART, PCO2ART, PO2ART, HCO3, TCO2, ACIDBASEDEF, O2SAT   Coagulation Profile: Recent Labs  Lab 05/29/19 0810  INR 0.9    Cardiac Enzymes: No results for input(s): CKTOTAL, CKMB, CKMBINDEX, TROPONINI in the last 168 hours.  HbA1C: Hgb A1c MFr Bld  Date/Time Value Ref Range Status  02/24/2015 03:11 PM 4.2 (L) 4.6 - 6.5 % Final    Comment:    Glycemic Control Guidelines for People with Diabetes:Non Diabetic:  <6%Goal of Therapy: <7%Additional Action Suggested:  >8%     CBG: No results for input(s): GLUCAP in the last 168 hours.  Review of Systems:   As per HPI  Past Medical History  She,  has a past medical history of Anxiety, CARCINOMA, SQUAMOUS CELL (2009), COPD (chronic obstructive pulmonary disease) ([place]), DEPRESSION, Dyspnea, GERD (gastroesophageal reflux disease), Headache(784.0), cardiovascular stress test, Irritable bowel syndrome, Lymphocytic colitis, Microscopic colitis (dx 02/2009), Osteoarthritis, OSTEOPENIA, Osteoporosis, unspecified (09/30/2008), and PTSD (post-traumatic stress disorder).   Surgical History    Past Surgical History:  Procedure Laterality Date  . ABDOMINAL HYSTERECTOMY  1985  . APPENDECTOMY  1969  . BACK SURGERY  09/16/2014   disk replacement with fusion  . BREAST BIOPSY  1987   left  . BUNIONECTOMY Right   . FOOT SURGERY Bilateral 1990   for joint in toes; bilateral  . TUBAL LIGATION       Social History   reports that she has been smoking cigarettes. She has a 35.00 pack-year smoking history. She has never used smokeless tobacco. She reports that she does not drink alcohol or use drugs.   Family History   Her family history includes Asthma in her father; Coronary artery disease in her mother; Dementia in her mother; Diabetes  in her mother and sister; Heart disease in her sister; Irritable bowel syndrome in her sister; Ovarian cancer in her sister. There is no history of Colon cancer.   Allergies Allergies  Allergen Reactions  . Tetracycline Other (See Comments)    Ulcers on tongue     Home Medications  Prior to Admission medications   Medication Sig Start Date End Date Taking? Authorizing Provider  acetaminophen (TYLENOL) 500 MG tablet Take 1,000 mg by mouth every 6 (six) hours as needed for mild pain.    Yes [provider]  budesonide (ENTOCORT EC) 3 MG 24 hr capsule Take 3 mg by mouth at bedtime.    Yes [provider]  cholecalciferol (VITAMIN D) 1000 UNITS tablet Take 1 tablet (1,000 Units total) by mouth daily. Patient taking differently: Take 1,000 Units by mouth at bedtime.  05/18/12  Yes Rowe Clack, MD  dicyclomine (BENTYL) 10 MG capsule Take 10 mg by mouth 4 (four) times daily -  before meals and at bedtime.   Yes [provider]  escitalopram (LEXAPRO) 20 MG tablet Take 1 tablet (20 mg total) by mouth daily. 02/07/19  Yes Hoyt Koch, MD  folic acid (FOLVITE) 1 MG tablet Take 1 tablet (1 mg total) by mouth daily. 02/13/15  Yes Milus Banister, MD  imipramine (TOFRANIL) 50 MG tablet TAKE 1 TABLET BY MOUTH EVERYDAY AT BEDTIME Patient taking differently: Take 50 mg by mouth at bedtime.  02/07/19  Yes Hoyt Koch, MD  umeclidinium-vilanterol (ANORO ELLIPTA) 62.5-25 MCG/INH AEPB Inhale 1 puff into the lungs daily. 04/19/19  Yes Mannam, Praveen, MD  VENTOLIN HFA 108 (90 Base) MCG/ACT inhaler TAKE 2 PUFFS BY MOUTH EVERY 6 HOURS AS NEEDED FOR WHEEZE OR SHORTNESS OF BREATH Patient taking differently: Inhale 2 puffs into the lungs every 6 (six) hours as needed for wheezing or shortness of breath.  10/16/18  Yes Hoyt Koch, MD     Critical care time: N/A     Baltazar Apo, MD, PhD 05/29/2019, 2:18 PM [facility] [phone] or if no answer [phone]

## 2019-05-29 NOTE — Anesthesia Preprocedure Evaluation (Signed)
Anesthesia Evaluation  Patient identified by MRN, date of birth, ID band Patient awake    Reviewed: Allergy & Precautions, NPO status , Patient's Chart, lab work & pertinent test results  Airway Mallampati: II  TM Distance: >3 FB Neck ROM: Full    Dental no notable dental hx.    Pulmonary COPD, Current Smoker and Patient abstained from smoking.,    Pulmonary exam normal breath sounds clear to auscultation       Cardiovascular negative cardio ROS Normal cardiovascular exam Rhythm:Regular Rate:Normal     Neuro/Psych  Headaches, Anxiety Depression negative psych ROS   GI/Hepatic Neg liver ROS, GERD  ,  Endo/Other  negative endocrine ROS  Renal/GU negative Renal ROS  negative genitourinary   Musculoskeletal  (+) Arthritis , Osteoarthritis,    Abdominal   Peds negative pediatric ROS (+)  Hematology negative hematology ROS (+)   Anesthesia Other Findings Interstitial lung disease  Reproductive/Obstetrics negative OB ROS                             Anesthesia Physical Anesthesia Plan  ASA: III  Anesthesia Plan: General   Post-op Pain Management:    Induction: Intravenous  PONV Risk Score and Plan: 2 and Ondansetron, Midazolam and Treatment may vary due to age or medical condition  Airway Management Planned: Oral ETT  Additional Equipment:   Intra-op Plan:   Post-operative Plan: Extubation in OR  Informed Consent: I have reviewed the patients History and Physical, chart, labs and discussed the procedure including the risks, benefits and alternatives for the proposed anesthesia with the patient or authorized representative who has indicated his/her understanding and acceptance.     Dental advisory given  Plan Discussed with: CRNA  Anesthesia Plan Comments:         Anesthesia Quick Evaluation

## 2019-05-30 ENCOUNTER — Inpatient Hospital Stay: Payer: Medicare HMO | Admitting: Primary Care

## 2019-05-30 ENCOUNTER — Observation Stay (HOSPITAL_COMMUNITY): Payer: Medicare HMO

## 2019-05-30 DIAGNOSIS — Z85828 Personal history of other malignant neoplasm of skin: Secondary | ICD-10-CM | POA: Diagnosis not present

## 2019-05-30 DIAGNOSIS — M8580 Other specified disorders of bone density and structure, unspecified site: Secondary | ICD-10-CM | POA: Diagnosis not present

## 2019-05-30 DIAGNOSIS — J939 Pneumothorax, unspecified: Secondary | ICD-10-CM

## 2019-05-30 DIAGNOSIS — K219 Gastro-esophageal reflux disease without esophagitis: Secondary | ICD-10-CM | POA: Diagnosis not present

## 2019-05-30 DIAGNOSIS — K5190 Ulcerative colitis, unspecified, without complications: Secondary | ICD-10-CM | POA: Diagnosis not present

## 2019-05-30 DIAGNOSIS — C342 Malignant neoplasm of middle lobe, bronchus or lung: Secondary | ICD-10-CM | POA: Diagnosis not present

## 2019-05-30 DIAGNOSIS — J95811 Postprocedural pneumothorax: Principal | ICD-10-CM

## 2019-05-30 DIAGNOSIS — R918 Other nonspecific abnormal finding of lung field: Secondary | ICD-10-CM | POA: Diagnosis not present

## 2019-05-30 DIAGNOSIS — M810 Age-related osteoporosis without current pathological fracture: Secondary | ICD-10-CM | POA: Diagnosis not present

## 2019-05-30 DIAGNOSIS — R69 Illness, unspecified: Secondary | ICD-10-CM | POA: Diagnosis not present

## 2019-05-30 DIAGNOSIS — J449 Chronic obstructive pulmonary disease, unspecified: Secondary | ICD-10-CM | POA: Diagnosis not present

## 2019-05-30 LAB — CYTOLOGY - NON PAP

## 2019-05-30 LAB — SURGICAL PATHOLOGY

## 2019-05-30 NOTE — Discharge Summary (Addendum)
Physician Discharge Summary  Patient ID: Monique Swanson MRN: [id number] DOB/AGE: Aug 13, 1955 64 y.o.  Admit date: 05/29/2019 Discharge date: 05/30/2019  Admission Diagnoses: Right iatrogenic pneumothorax Pulmonary nodules, multiple COPD Irritable bowel syndrome Depression  Discharge Diagnoses:  Principal Problem:   Pneumothorax on right Active Problems:   Pulmonary nodules/lesions, multiple   Pneumothorax after biopsy   Discharged Condition: stable  Hospital Course: 64 year old woman, smoker, with COPD.  Also with irritable bowel syndrome.  She is been under evaluation by Dr. Vaughan Browner in our office for interstitial lung disease.  This included a high-resolution CT scan of the chest 05/03/2019 that showed right middle lobe, left upper lobe, left lower lobe nodules suspicious for malignancy.  She underwent navigational bronchoscopy on 08/18/3265 that was complicated by small right pneumothorax.  She was admitted for observation. Repeat chest x-ray done 1600  2/16 showed that her pneumothorax had not enlarged.  She was followed overnight on supplemental oxygen.  A repeat portable chest x-ray 0620 2/17 showed that the pneumothorax was stable, slightly smaller than the day prior.  She was deemed stable and appropriate for discharge to home.  Instructions given that she should seek care urgently if she were to develop acute dyspnea, chest pain.  Consults: pulmonary/intensive care  Significant Diagnostic Studies: radiology: CXR: Stable small right pneumothorax, decreasing in size on 2/17  Treatments: Supplemental oxygen, observation  Discharge Exam: Blood pressure (!) 110/57, pulse 75, temperature 98.4 F (36.9 C), temperature source Oral, resp. rate 15, height 5' 2"  (1.575 m), weight 54 kg, SpO2 97 %. General appearance: alert, cooperative, appears stated age and no distress Throat: normal findings: tongue midline and normal, soft palate, uvula, and tonsils normal and oropharynx pink & moist  without lesions or evidence of thrush Neck: no adenopathy, no carotid bruit, no JVD and supple, symmetrical, trachea midline Resp: clear to auscultation bilaterally and cough on deep inspiration with associated R sided pleuritic pain Cardio: S1, S2 normal and systolic murmur: systolic ejection 2/6, blowing at 2nd left intercostal space GI: soft, non-tender; bowel sounds normal; no masses,  no organomegaly Extremities: extremities normal, atraumatic, no cyanosis or edema Skin: Skin color, texture, turgor normal. No rashes or lesions  Disposition: Discharge disposition: 01-Home or Self Care       Discharge Instructions    Discharge patient   Complete by: As directed    After chest x-ray has been reviewed   Discharge disposition: 01-Home or Self Care   Discharge patient date: 05/29/2019     Allergies as of 05/30/2019      Reactions   Tetracycline Other (See Comments)   Ulcers on tongue      Medication List    TAKE these medications   acetaminophen 500 MG tablet Commonly known as: TYLENOL Take 1,000 mg by mouth every 6 (six) hours as needed for mild pain.   Anoro Ellipta 62.5-25 MCG/INH Aepb Generic drug: umeclidinium-vilanterol Inhale 1 puff into the lungs daily.   budesonide 3 MG 24 hr capsule Commonly known as: ENTOCORT EC Take 3 mg by mouth at bedtime.   cholecalciferol 1000 units tablet Commonly known as: VITAMIN D Take 1 tablet (1,000 Units total) by mouth daily. What changed: when to take this   dicyclomine 10 MG capsule Commonly known as: BENTYL Take 10 mg by mouth 4 (four) times daily -  before meals and at bedtime.   escitalopram 20 MG tablet Commonly known as: LEXAPRO Take 1 tablet (20 mg total) by mouth daily.   folic acid 1 MG  tablet Commonly known as: FOLVITE Take 1 tablet (1 mg total) by mouth daily.   imipramine 50 MG tablet Commonly known as: TOFRANIL TAKE 1 TABLET BY MOUTH EVERYDAY AT BEDTIME What changed:   how much to take  how to take  this  when to take this  additional instructions   Ventolin HFA 108 (90 Base) MCG/ACT inhaler Generic drug: albuterol TAKE 2 PUFFS BY MOUTH EVERY 6 HOURS AS NEEDED FOR WHEEZE OR SHORTNESS OF BREATH What changed: See the new instructions.      Follow-up Information    Marshell Garfinkel, MD On 06/08/2019.   Specialty: Pulmonary Disease Contact information: [address] [place] [address] [phone]        Martyn Ehrich, NP Follow up on 06/01/2019.   Specialty: Pulmonary Disease Why: 11:00 Contact information: [address] [place] [address] [phone]           Signed: Collene Gobble 05/30/2019, 9:30 AM

## 2019-05-30 NOTE — Progress Notes (Signed)
Patient discharged to home. Verbalizes understanding of all discharge instructions and follow up MD visits. Dr. Lamonte Sakai instructed patient should she feel pain any different than how she is feeling now or a change in her breathing status she should return to the ED. Patient states she understands. RN walked with patient from room to car. Printed AVS provided to patient.

## 2019-05-31 NOTE — Anesthesia Postprocedure Evaluation (Signed)
Anesthesia Post Note  Patient: Microbiologist  Procedure(s) Performed: VIDEO BRONCHOSCOPY WITH ENDOBRONCHIAL NAVIGATION (N/A ) BRONCHIAL BRUSHINGS BRONCHIAL NEEDLE ASPIRATION BIOPSIES LUNG BIOPSY     Patient location during evaluation: PACU Anesthesia Type: General Level of consciousness: awake and alert Pain management: pain level controlled Vital Signs Assessment: post-procedure vital signs reviewed and stable Respiratory status: spontaneous breathing, nonlabored ventilation and respiratory function stable Cardiovascular status: blood pressure returned to baseline and stable Postop Assessment: no apparent nausea or vomiting Anesthetic complications: no    Last Vitals:  Vitals:   05/30/19 0415 05/30/19 0812  BP: (!) 110/57   Pulse: 75   Resp: 15   Temp: 36.9 C   SpO2: 100% 97%    Last Pain:  Vitals:   05/30/19 0647  TempSrc:   PainSc: [place]

## 2019-06-01 ENCOUNTER — Inpatient Hospital Stay: Payer: Medicare HMO | Admitting: Primary Care

## 2019-06-04 ENCOUNTER — Telehealth: Payer: Self-pay | Admitting: Emergency Medicine

## 2019-06-04 NOTE — Telephone Encounter (Signed)
Called pt and reviewed cytology results w her:  RML > shows squamous cell CA LUL > negative for malignancy.   She wants to speak with Dr Vaughan Browner this Friday at their OV about pros/cons of cancer treatment in light of her ILD diagnosis. I tend to believe that the ILD dx should not dissuade her from seeing Oncology, but will defer until she can see Dr Vaughan Browner

## 2019-06-05 ENCOUNTER — Other Ambulatory Visit (HOSPITAL_COMMUNITY)
Admission: RE | Admit: 2019-06-05 | Discharge: 2019-06-05 | Disposition: A | Discharge status: Home / Self Care | Payer: Medicare HMO | Source: Ambulatory Visit | Attending: Pulmonary Disease | Admitting: Pulmonary Disease

## 2019-06-05 DIAGNOSIS — Z01812 Encounter for preprocedural laboratory examination: Secondary | ICD-10-CM | POA: Diagnosis not present

## 2019-06-05 DIAGNOSIS — Z20822 Contact with and (suspected) exposure to covid-19: Secondary | ICD-10-CM | POA: Diagnosis not present

## 2019-06-05 LAB — SARS CORONAVIRUS 2 (TAT 6-24 HRS): SARS Coronavirus 2: NEGATIVE

## 2019-06-07 ENCOUNTER — Other Ambulatory Visit: Payer: Self-pay | Admitting: *Deleted

## 2019-06-07 NOTE — Progress Notes (Signed)
The proposed treatment discussed in cancer conference 2/25/21is for discussion purpose only and is not a binding recommendation.  The patient was not physically examined nor present for their treatment options.  Therefore, final treatment plans cannot be decided.

## 2019-06-08 ENCOUNTER — Other Ambulatory Visit: Payer: Self-pay

## 2019-06-08 ENCOUNTER — Ambulatory Visit (INDEPENDENT_AMBULATORY_CARE_PROVIDER_SITE_OTHER): Payer: Medicare HMO | Admitting: Pulmonary Disease

## 2019-06-08 ENCOUNTER — Encounter: Payer: Self-pay | Admitting: Pulmonary Disease

## 2019-06-08 VITALS — BP 108/62 | HR 88 | Temp 97.0°F | Ht 62.0 in | Wt 119.8 lb

## 2019-06-08 DIAGNOSIS — R918 Other nonspecific abnormal finding of lung field: Secondary | ICD-10-CM

## 2019-06-08 DIAGNOSIS — J849 Interstitial pulmonary disease, unspecified: Secondary | ICD-10-CM | POA: Diagnosis not present

## 2019-06-08 LAB — PULMONARY FUNCTION TEST
DL/VA % pred: 49 %
DL/VA: 2.11 ml/min/mmHg/L
DLCO cor % pred: 38 %
DLCO cor: 7.25 ml/min/mmHg
DLCO unc % pred: 40 %
DLCO unc: 7.58 ml/min/mmHg
FEF 25-75 Post: 1.68 L/sec
FEF 25-75 Pre: 1.59 L/sec
FEF2575-%Change-Post: 5 %
FEF2575-%Pred-Post: 79 %
FEF2575-%Pred-Pre: 75 %
FEV1-%Change-Post: 1 %
FEV1-%Pred-Post: 91 %
FEV1-%Pred-Pre: 90 %
FEV1-Post: 2.09 L
FEV1-Pre: 2.07 L
FEV1FVC-%Change-Post: 1 %
FEV1FVC-%Pred-Pre: 96 %
FEV6-%Change-Post: 0 %
FEV6-%Pred-Post: 96 %
FEV6-%Pred-Pre: 95 %
FEV6-Post: 2.76 L
FEV6-Pre: 2.74 L
FEV6FVC-%Change-Post: 0 %
FEV6FVC-%Pred-Post: 103 %
FEV6FVC-%Pred-Pre: 102 %
FVC-%Change-Post: 0 %
FVC-%Pred-Post: 93 %
FVC-%Pred-Pre: 93 %
FVC-Post: 2.78 L
FVC-Pre: 2.78 L
Post FEV1/FVC ratio: 75 %
Post FEV6/FVC ratio: 99 %
Pre FEV1/FVC ratio: 74 %
Pre FEV6/FVC Ratio: 99 %
RV % pred: 73 %
RV: 1.43 L
TLC % pred: 88 %
TLC: 4.2 L

## 2019-06-08 MED ORDER — FLUTICASONE PROPIONATE 50 MCG/ACT NA SUSP: 2.0000 | Freq: Every day | NASAL | 2 refills | Status: DC

## 2019-06-08 MED ORDER — BENZONATATE 200 MG PO CAPS: 200.0000 mg | ORAL_CAPSULE | Freq: Two times a day (BID) | ORAL | 1 refills | Status: DC | PRN

## 2019-06-08 NOTE — Progress Notes (Signed)
PFT done today. 

## 2019-06-08 NOTE — Patient Instructions (Signed)
We will make a referral to oncology for recent diagnosis of lung cancer We will start Tessalon Perles 200 mg twice daily for cough Start Flonase, saline nasal sprays for nasal congestion  Follow-up in 3 months.

## 2019-06-08 NOTE — Progress Notes (Addendum)
Monique Swanson    [id number]    1956/01/13  Primary Care Physician:Crawford, Real Cons, MD  Referring Physician: Hoyt Koch, MD [address] [place],  [place] [address]  Chief complaint: Follow up for lung cancer, pulmonary fibrosis  HPI: 64 year old smoker with history of ulcerative colitis, squamous cell cancer of the skin status post resection in 2014, osteoporosis  She had a CT scan in 2016 which showed evidence of interstitial lung disease, pulmonary fibrosis.  This was redemonstrated on recent chest x-ray and he has been referred for evaluation. History notable for ulcerative colitis for which she sees Dr. Alessandra Bevels at [facility] and is on budesonide with occasional prednisone tapers  Pets: Has a dog.  No cats, birds, farm animals Occupation: Currently on disability.  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  No hot tub, Jacuzzi or feather pillows or comforters Smoking history: 50-pack-year smoker.  Continues to smoke 1 pack/day Travel history: No significant travel history Relevant family history: Mom and sister have COPD.  They were smokers.  Interim history: She had a CT scan which showed a cavitary lung lesion which was positive on PET scan Bronchoscope on 2/16 with diagnosis of squamous cell cancer. She is here with her daughter for review of results and plan for next steps  Outpatient Encounter Medications as of 06/08/2019  Medication Sig  . acetaminophen (TYLENOL) 500 MG tablet Take 1,000 mg by mouth every 6 (six) hours as needed for mild pain.   . budesonide (ENTOCORT EC) 3 MG 24 hr capsule Take 3 mg by mouth at bedtime.   . cholecalciferol (VITAMIN D) 1000 UNITS tablet Take 1 tablet (1,000 Units total) by mouth daily. (Patient taking differently: Take 1,000 Units by mouth at bedtime. )  . dicyclomine (BENTYL) 10 MG capsule Take 10 mg by  mouth 4 (four) times daily -  before meals and at bedtime.  Marland Kitchen escitalopram (LEXAPRO) 20 MG tablet Take 1 tablet (20 mg total) by mouth daily.  . folic acid (FOLVITE) 1 MG tablet Take 1 tablet (1 mg total) by mouth daily.  Marland Kitchen imipramine (TOFRANIL) 50 MG tablet TAKE 1 TABLET BY MOUTH EVERYDAY AT BEDTIME (Patient taking differently: Take 50 mg by mouth at bedtime. )  . umeclidinium-vilanterol (ANORO ELLIPTA) 62.5-25 MCG/INH AEPB Inhale 1 puff into the lungs daily.  . VENTOLIN HFA 108 (90 Base) MCG/ACT inhaler TAKE 2 PUFFS BY MOUTH EVERY 6 HOURS AS NEEDED FOR WHEEZE OR SHORTNESS OF BREATH (Patient taking differently: Inhale 2 puffs into the lungs every 6 (six) hours as needed for wheezing or shortness of breath. )   No facility-administered encounter medications on file as of 06/08/2019.   Physical Exam: Blood pressure 108/62, pulse 88, temperature (!) 97 F (36.1 C), temperature source Temporal, height 5' 2"  (1.575 m), weight 119 lb 12.8 oz (54.3 kg), SpO2 99 %. Gen:      No acute distress HEENT:  EOMI, sclera anicteric Neck:     No masses; no thyromegaly Lungs:    Clear to auscultation bilaterally; normal respiratory effort CV:         Regular rate and rhythm; no murmurs Abd:      + bowel sounds; soft, non-tender; no palpable masses, no distension Ext:    No edema; adequate peripheral perfusion Skin:      Warm and dry; no rash Neuro: alert and  oriented x 3 Psych: normal mood and affect  Data Reviewed: Imaging: CTA 07/31/2014-no pulmonary embolism, subpleural fibrosis with groundglass and emphysema.  Evidence of prior granulomatous exposure.  High-res CT chest 05/03/2019-3 right middle lobe nodule.  Left upper lobe and left lower lobe pulmonary nodules, moderate emphysema, pulmonary fibrosis and indeterminate pattern  PET scan 05/16/2019-Hypermetabolic pulmonary nodules in right middle lobe, left upper lobe, and left lower lobe positive mediastinal and hilar lymph nodes.  I have reviewed the  images personally.  PFTs  06/08/2019 FVC 2.78 [93%], FEV1 2.09 [91%], F/F 75, TLC 4.20 [88%], DLCO 7.58 [40%) Severe diffusion defect  Labs: CTD serologies 04/22/2019-negative  Assessment:  Squamous cell cancer She has a new diagnosis of lung cancer after recent bronchoscopy Discussed findings in detail with patient and recommended oncology eval She is not sure if she wants to go through with treatment as her husband had significant side effects from treatment for prostate cancer I encouraged her to at least meet with a specialist and review options are to available to her.  Interstitial lung disease CT reviewed with changes of pulmonary fibrosis in 2016 History notable for possible mold exposure.   Work-up for CTD serologies are negative today.    We will continue monitoring.   Emphysema Continue Anoro  Active smoker Smoking cessation discussed but she is not ready to quit at present.  Reassess at return visit Time spent counseling-5 minutes  Plan/Recommendations: Referral to oncology Tessalon Perles for cough Continue Anoro Flonase, saline spray for nasal congestion  This appointment required 45 minutes of patient care (this includes precharting, chart review, review of results, face-to-face care, etc.).  Marshell Garfinkel MD [facility] 06/08/2019, 12:22 PM  CC: Hoyt Koch, *

## 2019-06-11 ENCOUNTER — Encounter: Payer: Self-pay | Admitting: *Deleted

## 2019-06-11 DIAGNOSIS — R918 Other nonspecific abnormal finding of lung field: Secondary | ICD-10-CM

## 2019-06-11 NOTE — Progress Notes (Signed)
Oncology Nurse Navigator Documentation  Oncology Nurse Navigator Flowsheets 06/11/2019  Navigator Location [facility]  Navigator Encounter Type Other/I received referral on Monique Swanson. Per cancer conference referral to Rad Onc completed.   Barriers/Navigation Needs Coordination of Care  Interventions Coordination of Care  Acuity Level 2-Minimal Needs (1-2 Barriers Identified)  Coordination of Care Other  Time Spent with Patient 15

## 2019-06-12 ENCOUNTER — Telehealth: Payer: Self-pay | Admitting: *Deleted

## 2019-06-12 NOTE — Telephone Encounter (Signed)
Called the patient to schedule a consultation with Dr. Sondra Come and she is refusing treatment. She said she saw her husband suffer through cancer and she does want to go through that. She may change her mind , but for the time being she is refusing treatment.

## 2019-06-13 ENCOUNTER — Encounter: Payer: Self-pay | Admitting: *Deleted

## 2019-06-13 NOTE — Progress Notes (Signed)
Oncology Nurse Navigator Documentation  Oncology Nurse Navigator Flowsheets 06/13/2019  Navigator Location [facility]  Navigator Encounter Type Other/I received notification that patient does not want to be seen for cancer care. I updated referring Dr. Vaughan Browner and his assistant.   Barriers/Navigation Needs Coordination of Care  Interventions Coordination of Care  Acuity Level 2-Minimal Needs (1-2 Barriers Identified)  Coordination of Care Other  Time Spent with Patient 30

## 2019-07-06 MED ORDER — HYDROCODONE-HOMATROPINE 5-1.5 MG/5ML PO SYRP: 5.0000 mL | ORAL_SOLUTION | Freq: Four times a day (QID) | ORAL | 0 refills | Status: DC | PRN

## 2019-07-06 NOTE — Telephone Encounter (Signed)
Dr.Mannam, Patient is taking Benzonatate 200 mg 2x a day. She is still having problems sleeping due to coughing all night and not being able to sleep or relax mind. States that you mentioned there was something else she could try.  Please Advise. Thank you

## 2019-07-06 NOTE — Telephone Encounter (Signed)
I sent in an order for hycodan

## 2019-07-18 ENCOUNTER — Other Ambulatory Visit: Payer: Self-pay

## 2019-07-19 MED ORDER — HYDROCODONE-HOMATROPINE 5-1.5 MG/5ML PO SYRP: 5.0000 mL | ORAL_SOLUTION | Freq: Four times a day (QID) | ORAL | 0 refills | Status: DC | PRN

## 2019-07-31 ENCOUNTER — Other Ambulatory Visit: Payer: Self-pay | Admitting: Pulmonary Disease

## 2019-07-31 NOTE — Telephone Encounter (Signed)
Spoke with pt, she states she she still has a cough at night. She states the cough is getting better but the medication is helping to stop the cough at night so she can sleep. Dr. Vaughan Browner please advise if we can refill Hycodan.   Patient Instructions by Marshell Garfinkel, MD at 06/08/2019 12:00 PM Author: Marshell Garfinkel, MD Author Type: Physician Filed: 06/08/2019 12:33 PM  Note Status: Signed Cosign: Cosign Not Required Encounter Date: 06/08/2019  Editor: Marshell Garfinkel, MD (Physician)    We will make a referral to oncology for recent diagnosis of lung cancer We will start Tessalon Perles 200 mg twice daily for cough Start Flonase, saline nasal sprays for nasal congestion  Follow-up in 3 months.    Instructions  We will make a referral to oncology for recent diagnosis of lung cancer We will start Tessalon Perles 200 mg twice daily for cough Start Flonase, saline nasal sprays for nasal congestion  Follow-up in 3 months.

## 2019-08-02 MED ORDER — HYDROCODONE-HOMATROPINE 5-1.5 MG/5ML PO SYRP: 5.0000 mL | ORAL_SOLUTION | Freq: Four times a day (QID) | ORAL | 0 refills | Status: DC | PRN

## 2019-08-02 NOTE — Telephone Encounter (Signed)
Spoke with pt. She is aware that Dr. Vaughan Browner refilled her medication. Nothing further needed.

## 2019-08-02 NOTE — Telephone Encounter (Signed)
I refilled the order for hycodan

## 2019-08-13 ENCOUNTER — Other Ambulatory Visit: Payer: Self-pay | Admitting: Pulmonary Disease

## 2019-08-13 NOTE — Telephone Encounter (Signed)
Pt is requesting refill on Hycodan cough syrup. Last refill: 08/02/2019 #240 mL with 0 refills, take 5 mL q6h prn cough.  Last OV:06/08/2019 Next OV: 09/05/19  Please advise on refill- rx has been pended in pt chart to preferred pharmacy.  Thanks!

## 2019-08-14 MED ORDER — HYDROCODONE-HOMATROPINE 5-1.5 MG/5ML PO SYRP: 5.0000 mL | ORAL_SOLUTION | Freq: Four times a day (QID) | ORAL | 0 refills | Status: DC | PRN

## 2019-08-24 ENCOUNTER — Telehealth: Payer: Self-pay | Admitting: Pulmonary Disease

## 2019-08-24 MED ORDER — HYDROCODONE-HOMATROPINE 5-1.5 MG/5ML PO SYRP: 5.0000 mL | ORAL_SOLUTION | Freq: Four times a day (QID) | ORAL | 0 refills | Status: DC | PRN

## 2019-08-24 NOTE — Telephone Encounter (Signed)
Called and spoke with pt who is requesting a refill of her hydrocodone cough syrup. Dr. Vaughan Browner, please advise if you are okay refilling med for pt. This has been pended for you to send to pharmacy if you are okay with it being done.

## 2019-08-24 NOTE — Telephone Encounter (Signed)
Spoke with pt and advised rx sent to pharmacy. Nothing further is needed.   

## 2019-08-24 NOTE — Telephone Encounter (Signed)
Ok. Done 

## 2019-08-25 ENCOUNTER — Other Ambulatory Visit: Payer: Self-pay | Admitting: Pulmonary Disease

## 2019-08-25 DIAGNOSIS — J849 Interstitial pulmonary disease, unspecified: Secondary | ICD-10-CM

## 2019-09-01 ENCOUNTER — Other Ambulatory Visit: Payer: Self-pay | Admitting: Pulmonary Disease

## 2019-09-01 DIAGNOSIS — R918 Other nonspecific abnormal finding of lung field: Secondary | ICD-10-CM

## 2019-09-05 ENCOUNTER — Other Ambulatory Visit: Payer: Self-pay

## 2019-09-05 ENCOUNTER — Encounter: Payer: Self-pay | Admitting: Pulmonary Disease

## 2019-09-05 ENCOUNTER — Telehealth: Payer: Self-pay | Admitting: Internal Medicine

## 2019-09-05 ENCOUNTER — Ambulatory Visit (INDEPENDENT_AMBULATORY_CARE_PROVIDER_SITE_OTHER): Payer: Medicare HMO | Admitting: Pulmonary Disease

## 2019-09-05 VITALS — BP 104/60 | HR 78 | Temp 98.4°F | Ht 62.0 in | Wt 115.4 lb

## 2019-09-05 DIAGNOSIS — C3411 Malignant neoplasm of upper lobe, right bronchus or lung: Secondary | ICD-10-CM | POA: Diagnosis not present

## 2019-09-05 MED ORDER — ALBUTEROL SULFATE HFA 108 (90 BASE) MCG/ACT IN AERS: INHALATION_SPRAY | RESPIRATORY_TRACT | 5 refills | Status: DC

## 2019-09-05 MED ORDER — HYDROCODONE-HOMATROPINE 5-1.5 MG/5ML PO SYRP: 5.0000 mL | ORAL_SOLUTION | Freq: Four times a day (QID) | ORAL | 0 refills | Status: DC | PRN

## 2019-09-05 NOTE — Addendum Note (Signed)
Addended by: Elton Sin on: 09/05/2019 03:26 PM   Modules accepted: Orders

## 2019-09-05 NOTE — Patient Instructions (Addendum)
We have reviewed your care change of goals of care to DNR Please make a referral to home palliative care We will start you on prednisone at 20 mg. Follow-up in clinic in 6 months.

## 2019-09-05 NOTE — Progress Notes (Signed)
Monique Swanson    [id number]    [birth year]  Primary Care Physician:Crawford, Real Cons, MD  Referring Physician: Hoyt Koch, MD [address] [place] [facility] [place],  [place] [address]  Chief complaint: Follow up for lung cancer, pulmonary fibrosis  HPI: 64 year old smoker with history of ulcerative colitis, squamous cell cancer of the skin status post resection in 2014, osteoporosis  She had a CT scan in 2016 which showed evidence of interstitial lung disease, pulmonary fibrosis.  This was redemonstrated on recent chest x-ray and he has been referred for evaluation. History notable for ulcerative colitis for which she sees Dr. Alessandra Bevels at [facility] and is on budesonide with occasional prednisone tapers  Pets: Has a dog.  No cats, birds, farm animals Occupation: Currently on disability.  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  No hot tub, Jacuzzi or feather pillows or comforters Smoking history: 50-pack-year smoker.  Continues to smoke 1 pack/day Travel history: No significant travel history Relevant family history: Mom and sister have COPD.  They were smokers.  Interim history: She had a CT scan which showed a cavitary lung lesion which was positive on PET scan Bronchoscope on 2/16 with diagnosis of squamous cell cancer.  We have referred her to oncology but patient canceled appointment She states that she does not want any further work-up or treatment for cancer.  Outpatient Encounter Medications as of 09/05/2019  Medication Sig  . acetaminophen (TYLENOL) 500 MG tablet Take 1,000 mg by mouth every 6 (six) hours as needed for mild pain.   Jearl Klinefelter ELLIPTA 62.5-25 MCG/INH AEPB TAKE 1 PUFF BY MOUTH EVERY DAY  . benzonatate (TESSALON) 200 MG capsule Take 1 capsule (200 mg total) by mouth 2 (two) times daily as needed for cough.  . budesonide (ENTOCORT EC) 3 MG 24 hr  capsule Take 3 mg by mouth at bedtime.   . cholecalciferol (VITAMIN D) 1000 UNITS tablet Take 1 tablet (1,000 Units total) by mouth daily. (Patient taking differently: Take 1,000 Units by mouth at bedtime. )  . dicyclomine (BENTYL) 10 MG capsule Take 10 mg by mouth 4 (four) times daily -  before meals and at bedtime.  Marland Kitchen escitalopram (LEXAPRO) 20 MG tablet Take 1 tablet (20 mg total) by mouth daily.  . fluticasone (FLONASE) 50 MCG/ACT nasal spray SPRAY 2 SPRAYS INTO EACH NOSTRIL EVERY DAY  . folic acid (FOLVITE) 1 MG tablet Take 1 tablet (1 mg total) by mouth daily.  Marland Kitchen HYDROcodone-homatropine (HYCODAN) 5-1.5 MG/5ML syrup Take 5 mLs by mouth every 6 (six) hours as needed for cough.  Marland Kitchen imipramine (TOFRANIL) 50 MG tablet TAKE 1 TABLET BY MOUTH EVERYDAY AT BEDTIME (Patient taking differently: Take 50 mg by mouth at bedtime. )  . VENTOLIN HFA 108 (90 Base) MCG/ACT inhaler TAKE 2 PUFFS BY MOUTH EVERY 6 HOURS AS NEEDED FOR WHEEZE OR SHORTNESS OF BREATH (Patient taking differently: Inhale 2 puffs into the lungs every 6 (six) hours as needed for wheezing or shortness of breath. )   No facility-administered encounter medications on file as of 09/05/2019.   Physical Exam: Blood pressure 104/60, pulse 78, temperature 98.4 F (36.9 C), temperature source Oral, height 5' 2"  (1.575 m), weight 115 lb 6.4 oz (52.3 kg), SpO2 99 %. Gen:      No acute distress HEENT:  EOMI, sclera anicteric Neck:     No masses;  no thyromegaly Lungs:    Clear to auscultation bilaterally; normal respiratory effort CV:         Regular rate and rhythm; no murmurs Abd:      + bowel sounds; soft, non-tender; no palpable masses, no distension Ext:    No edema; adequate peripheral perfusion Skin:      Warm and dry; no rash Neuro: alert and oriented x 3 Psych: normal mood and affect  Data Reviewed: Imaging: CTA 07/31/2014-no pulmonary embolism, subpleural fibrosis with groundglass and emphysema.  Evidence of prior granulomatous  exposure.  High-res CT chest 05/03/2019-3 right middle lobe nodule.  Left upper lobe and left lower lobe pulmonary nodules, moderate emphysema, pulmonary fibrosis and indeterminate pattern  PET scan 05/16/2019-Hypermetabolic pulmonary nodules in right middle lobe, left upper lobe, and left lower lobe positive mediastinal and hilar lymph nodes.  I have reviewed the images personally.  PFTs  06/08/2019 FVC 2.78 [93%], FEV1 2.09 [91%], F/F 75, TLC 4.20 [88%], DLCO 7.58 [40%) Severe diffusion defect  Labs: CTD serologies 04/22/2019-negative  Assessment:  Squamous cell cancer She has a new diagnosis of lung cancer after recent bronchoscopy Discussed findings in detail with patient and recommended oncology eval  Patient has elected not to proceed with treatment and canceled her oncology appointments.     Interstitial lung disease CT reviewed with changes of pulmonary fibrosis in 2016 History notable for possible mold exposure.   Work-up for CTD serologies are negative She could have hypersensitivity pneumonitis but deferring invasive work-up due to poor overall prognosis We will start her appropriate prednisone 20 mg/day and recommend close clinical avoidance Hycodan for cough  Emphysema Continue Anoro  Active smoker Smoking cessation discussed but she is not ready to quit at present.    Goals of care Reviewed in detail.  Since she has elected not to treat her lung cancer we will make referral to palliative care.  She is not ready for hospice just yet  DNR order entered and MOST form filled out.  Plan/Recommendations: Hycodan for cough Continue Anoro Prednisone 20 mg Palliative care, DNR  Marshell Garfinkel MD [facility] 09/05/2019, 11:10 AM  CC: Hoyt Koch, *

## 2019-09-05 NOTE — Telephone Encounter (Signed)
[facility] Palliative visit scheduled for 09-11-19 at 10:00.

## 2019-09-06 ENCOUNTER — Telehealth: Payer: Self-pay | Admitting: Pulmonary Disease

## 2019-09-06 MED ORDER — ONDANSETRON HCL 4 MG PO TABS: 4.0000 mg | ORAL_TABLET | Freq: Three times a day (TID) | ORAL | 2 refills | Status: DC | PRN

## 2019-09-06 NOTE — Telephone Encounter (Signed)
Call in zofran 4 mg three times a day as needed for nausea

## 2019-09-06 NOTE — Telephone Encounter (Signed)
Called and spoke with Patient. Dr Matilde Bash recommendations given.  Understanding stated.  Zofran prescription sent to requested [facility] [address]. Nothing further at this time.

## 2019-09-06 NOTE — Telephone Encounter (Signed)
Patient requesting new prescription for nausea, please advise.

## 2019-09-11 ENCOUNTER — Other Ambulatory Visit: Payer: Medicare HMO | Admitting: Internal Medicine

## 2019-09-11 ENCOUNTER — Other Ambulatory Visit: Payer: Self-pay

## 2019-09-11 DIAGNOSIS — C3491 Malignant neoplasm of unspecified part of right bronchus or lung: Secondary | ICD-10-CM | POA: Diagnosis not present

## 2019-09-11 DIAGNOSIS — Z515 Encounter for palliative care: Secondary | ICD-10-CM

## 2019-09-12 NOTE — Progress Notes (Signed)
[facility] Consult Note Telephone: [phone]  Fax: [phone]  PATIENT NAME: Maybell Misenheimer Laino DOB: 1955/09/22 MRN: [id number]  PRIMARY CARE PROVIDER:   Hoyt Koch, MD  REFERRING PROVIDER:  Dr. Marshell Garfinkel   RESPONSIBLE PARTY:   self      RECOMMENDATIONS and PLAN:  Palliative Care Encounter Z51.5  1.  Advance care planning:  Explanation of Palliative and Hospice care.  Patient's goals are to spend quality time with her family and transition to Hospice care in the near future when additional support is needed.  She does not plan on escalating care for treatment of lung cancer.  Her focus is on comfort measures.  Advanced directives were reviewed.  Previously completed DNAR and MOST forms reviewed with patient and her children.  Selections of limited additional interventions, antibiotic and IV therapy if indicated, and no feeding tube have been selected.  Patient states she still desires these selections.  Directives will be readdressed in the future.  Palliative care will follow up with patient in approximately 2 weeks and assist with transition to hospice care when necessary.  2.  Nausea: Encouraged routine use of Zofran 30 minutes prior to meals to decrease nausea.  Take Pantoprazole daily as prescribed.  Small and frequent light meals and snacks throughout the day.  Alternatives to Zofran are available if nausea is not relieved by its use such as Compazine.  Patient is hesitant to use medication.  Discussed when it is necessary to add medication in controlling symptoms as compared to modifications in diet and behaviors.  She was receptive to this plan.  Reevaluate degree of nausea during next visit.  3. Cough and shortness of breath:  Expectation of additional decline.  Continue use of Hycodan at bedtime.  Restart Tessalon pearles(home supply) during day hours. Continue all respiratory meds.  Re-evaluated next visit.  4.   Weight loss: Related to poor nutritional intake in progression of disease process.  Comfort feedings.  Add nutritional beverage to feedings.  She will begin Ensure today as a trial.   5.  Depression:  Increased related to stress from terminal diagnosis.  Consider increase of Escitalopram to 39m daily.  Supportive care.  Elicited patient support from son and daughter. Chaplain/social worker support. Continue to monitor.     I spent 60 minutes providing this consultation,  from 1230 to 1330.  More than 50% of the time in this consultation was spent coordinating communication patient, daughter and son..Marland Kitchen  HISTORY OF PRESENT ILLNESS:  [place] 64[place] old female with multiple medical problems including COPD, colitis and recent diagnosis of squamous cell cancer of right lung.  She states that she decided not to have treatment of lung cancer and does not want to be more ill than what she is already.  She reports increasing shortness of breath with exertion, decrease in energy along with continued weight loss.  Current weight is 114# and was 143 in April 2020.  Reports daily nausea with occasional vomiting.  She has not been using Zofran tablets that were prescribed.  She has noctural coughing that is controlled with use of Hycodan syrup. "It helps me relax also so that I can sleep.  Palliative Care was asked to help address goals of care.   CODE STATUS: DNAR/DNI  PPS: 50% HOSPICE ELIGIBILITY/DIAGNOSIS: In near future/ Squamous cell lung cancer without plans for  treatment  PAST MEDICAL HISTORY:  Past Medical History:  Diagnosis Date  . Anxiety   . CARCINOMA, SQUAMOUS CELL 2009   s/p rescrtion from R nose bridge  . COPD (chronic obstructive pulmonary disease) ([place])   . DEPRESSION   . Dyspnea   . GERD (gastroesophageal reflux disease)   . Headache(784.0)    migraine- in past rare now- (05/24/2019)  . Hx of cardiovascular stress test    Lex MV 6/14:  Normal, EF 80%  . Irritable  bowel syndrome   . Lymphocytic colitis   . Microscopic colitis dx 02/2009   chronic diarrhea  . Osteoarthritis    ostroporsis  . OSTEOPENIA   . Osteoporosis, unspecified 09/30/2008   LeB DEXA 05/2012: -2.7, recommended to start bisphos    . PTSD (post-traumatic stress disorder)       PERTINENT MEDICATIONS:  Outpatient Encounter Medications as of 09/11/2019  Medication Sig  . acetaminophen (TYLENOL) 500 MG tablet Take 1,000 mg by mouth every 6 (six) hours as needed for mild pain.   Marland Kitchen albuterol (VENTOLIN HFA) 108 (90 Base) MCG/ACT inhaler TAKE 2 PUFFS BY MOUTH EVERY 6 HOURS AS NEEDED FOR WHEEZE OR SHORTNESS OF BREATH  . ANORO ELLIPTA 62.5-25 MCG/INH AEPB TAKE 1 PUFF BY MOUTH EVERY DAY  . benzonatate (TESSALON) 200 MG capsule Take 1 capsule (200 mg total) by mouth 2 (two) times daily as needed for cough.  . budesonide (ENTOCORT EC) 3 MG 24 hr capsule Take 3 mg by mouth at bedtime.   . cholecalciferol (VITAMIN D) 1000 UNITS tablet Take 1 tablet (1,000 Units total) by mouth daily. (Patient taking differently: Take 1,000 Units by mouth at bedtime. )  . dicyclomine (BENTYL) 10 MG capsule Take 10 mg by mouth 4 (four) times daily -  before meals and at bedtime.  Marland Kitchen escitalopram (LEXAPRO) 20 MG tablet Take 1 tablet (20 mg total) by mouth daily.  . fluticasone (FLONASE) 50 MCG/ACT nasal spray SPRAY 2 SPRAYS INTO EACH NOSTRIL EVERY DAY  . folic acid (FOLVITE) 1 MG tablet Take 1 tablet (1 mg total) by mouth daily.  Marland Kitchen HYDROcodone-homatropine (HYCODAN) 5-1.5 MG/5ML syrup Take 5 mLs by mouth every 6 (six) hours as needed for cough.  Marland Kitchen HYDROcodone-homatropine (HYCODAN) 5-1.5 MG/5ML syrup Take 5 mLs by mouth every 6 (six) hours as needed for cough.  Marland Kitchen imipramine (TOFRANIL) 50 MG tablet TAKE 1 TABLET BY MOUTH EVERYDAY AT BEDTIME (Patient taking differently: Take 50 mg by mouth at bedtime. )  . ondansetron (ZOFRAN) 4 MG tablet Take 1 tablet (4 mg total) by mouth 3 (three) times daily as needed for nausea or  vomiting.   No facility-administered encounter medications on file as of 09/11/2019.    PHYSICAL EXAM:   General: NAD, chronically ill and frail appearing, thin Cardiovascular: regular rate and rhythm Pulmonary: clear throughout with very congested productive cough Abdomen: soft, nontender, + bowel sounds Extremities: thin and decreased muscle mass Skin: exposed skin is intact Neurological: alert and oriented x3, generalized weakness Psych:  Depressed mood  Gonzella Lex, NP-C

## 2019-09-14 ENCOUNTER — Telehealth: Payer: Self-pay | Admitting: Pulmonary Disease

## 2019-09-14 MED ORDER — HYDROCODONE-HOMATROPINE 5-1.5 MG/5ML PO SYRP: 5.0000 mL | ORAL_SOLUTION | Freq: Four times a day (QID) | ORAL | 0 refills | Status: DC | PRN

## 2019-09-14 NOTE — Telephone Encounter (Signed)
Spoke with patient. She is aware that the cough syrup has been sent in for her. Verbalized understanding.   Nothing further needed at time of call.

## 2019-09-14 NOTE — Telephone Encounter (Signed)
OK. Done 

## 2019-09-14 NOTE — Telephone Encounter (Signed)
Patient requesting refill of Hycodan cough syrup. Last filled on 5/26 266m Patient reports she only takes it at night and the refill only last 10 days. Please advise  Patient called and notified that we are passing this message on to Dr. MVaughan Browner

## 2019-09-25 ENCOUNTER — Telehealth: Payer: Self-pay | Admitting: Pulmonary Disease

## 2019-09-25 NOTE — Telephone Encounter (Signed)
Called patient regarding cough medication, she states she was advised by Dr. Vaughan Browner to call prior to running out.  I will route this to Dr. Vaughan Browner.

## 2019-09-26 MED ORDER — HYDROCODONE-HOMATROPINE 5-1.5 MG/5ML PO SYRP: 5.0000 mL | ORAL_SOLUTION | Freq: Four times a day (QID) | ORAL | 0 refills | Status: DC | PRN

## 2019-09-26 NOTE — Telephone Encounter (Signed)
Ok done

## 2019-09-26 NOTE — Telephone Encounter (Signed)
Spoke with patient. She is aware that the medication has been sent in for her.   Nothing further needed at time of call.

## 2019-10-05 ENCOUNTER — Telehealth: Payer: Self-pay | Admitting: Pulmonary Disease

## 2019-10-05 MED ORDER — HYDROCODONE-HOMATROPINE 5-1.5 MG/5ML PO SYRP: 5.0000 mL | ORAL_SOLUTION | Freq: Four times a day (QID) | ORAL | 0 refills | Status: DC | PRN

## 2019-10-05 NOTE — Telephone Encounter (Signed)
She is in palliative care. I have refilled her hycodan

## 2019-10-05 NOTE — Telephone Encounter (Signed)
Called patient and she is requesting refill for Hycodan. Last filled 09/26/2019, picked up on 09/27/2019. Patient got 240 ml in her last refill. Please advise/refill.

## 2019-10-19 ENCOUNTER — Telehealth: Payer: Self-pay | Admitting: Pulmonary Disease

## 2019-10-19 NOTE — Telephone Encounter (Signed)
Patient requesting refill for Hydrocan and an excuse from jury duty. Please advise. Last refill 10/05/2019  Sunday, pt will be out of medication. Needs new prescription for cough medicine. Also pt has jurry summons, needs letter from dr. about disability affecting ability to serve.

## 2019-10-23 MED ORDER — HYDROCODONE-HOMATROPINE 5-1.5 MG/5ML PO SYRP: 5.0000 mL | ORAL_SOLUTION | Freq: Four times a day (QID) | ORAL | 0 refills | Status: DC | PRN

## 2019-10-23 NOTE — Telephone Encounter (Signed)
Patient requesting refill on Hydrocodone cough medication and a letter to excuse her from jury duty. Please advise.

## 2019-10-23 NOTE — Telephone Encounter (Signed)
Pt calling back about refill of hydrocodone cough medicine and needs letter as well.  Pt takes last dose tonight.  Please advise.  [place]

## 2019-10-23 NOTE — Telephone Encounter (Signed)
Patient notified of refill, jury excuse letter sent as requested.

## 2019-10-23 NOTE — Telephone Encounter (Signed)
Okay to give letter for jury duty excusal due to medical condition.  I have renewed her Hycodan

## 2019-11-02 ENCOUNTER — Other Ambulatory Visit: Payer: Self-pay | Admitting: Pulmonary Disease

## 2019-11-02 NOTE — Telephone Encounter (Signed)
Left message for patient to call back  

## 2019-11-05 NOTE — Telephone Encounter (Signed)
Called and spoke with pt. Pt is requesting a refill of her hycodan cough syrup. Pt also wants to know if she could schedule a cxr to see if there are any changes compared to her previous one.  Dr. Vaughan Browner, please advise.

## 2019-11-05 NOTE — Telephone Encounter (Signed)
Pt is returning call from Friday - [phone]

## 2019-11-06 NOTE — Telephone Encounter (Signed)
Pt aware awaiting response per Dr Vaughan Browner

## 2019-11-06 NOTE — Telephone Encounter (Signed)
Pt calling back regarding hycodan. States she has been out for 2 nights. Please advise.

## 2019-11-07 MED ORDER — HYDROCODONE-HOMATROPINE 5-1.5 MG/5ML PO SYRP: 5.0000 mL | ORAL_SOLUTION | Freq: Four times a day (QID) | ORAL | 0 refills | Status: DC | PRN

## 2019-11-07 NOTE — Telephone Encounter (Signed)
Called and spoke with pt letting her know that the RX was called in and she verbalized understanding. Nothing further needed.

## 2019-11-07 NOTE — Telephone Encounter (Signed)
I called it in 

## 2019-11-19 ENCOUNTER — Telehealth: Payer: Self-pay | Admitting: Pulmonary Disease

## 2019-11-19 NOTE — Telephone Encounter (Signed)
Spoke with the pt Pt is asking for refill on hydromet  Last given # 240 ml on 11/07/19  Refill pended  Please advise thanks

## 2019-11-20 MED ORDER — HYDROCODONE-HOMATROPINE 5-1.5 MG/5ML PO SYRP: 5.0000 mL | ORAL_SOLUTION | Freq: Four times a day (QID) | ORAL | 0 refills | Status: DC | PRN

## 2019-11-20 NOTE — Telephone Encounter (Signed)
I have refilled order. Thanks

## 2019-11-21 NOTE — Telephone Encounter (Signed)
Called and spoke with pt letting her know that Dr. Vaughan Browner refilled her hydromet and she verbalized understanding. Nothing further needed.

## 2019-12-03 ENCOUNTER — Telehealth: Payer: Self-pay | Admitting: Pulmonary Disease

## 2019-12-03 NOTE — Telephone Encounter (Signed)
Called and spoke with pt. Pt is requesting to have her Hycodan cough syrup refilled sending it to [facility].  Rx has been pended. Dr. Vaughan Browner, please advise if you are okay sending this to pharmacy for pt.

## 2019-12-04 ENCOUNTER — Telehealth: Payer: Self-pay | Admitting: Internal Medicine

## 2019-12-04 DIAGNOSIS — C3491 Malignant neoplasm of unspecified part of right bronchus or lung: Secondary | ICD-10-CM | POA: Diagnosis not present

## 2019-12-04 DIAGNOSIS — K52832 Lymphocytic colitis: Secondary | ICD-10-CM | POA: Diagnosis not present

## 2019-12-04 MED ORDER — HYDROCODONE-HOMATROPINE 5-1.5 MG/5ML PO SYRP: 5.0000 mL | ORAL_SOLUTION | Freq: Four times a day (QID) | ORAL | 0 refills | Status: DC | PRN

## 2019-12-04 NOTE — Telephone Encounter (Signed)
Returned phone call to patient from email message.  She requested to reschedule today's previously scheduled palliative care visit.  Next appointment was agreed upon for 12/19/19 at [facility].  Encouraged pt. To call or email sooner to this date if necessary for any changes.   Gonzella Lex, NP-C

## 2019-12-05 MED ORDER — HYDROCODONE-HOMATROPINE 5-1.5 MG/5ML PO SYRP: 5.0000 mL | ORAL_SOLUTION | Freq: Four times a day (QID) | ORAL | 0 refills | Status: DC | PRN

## 2019-12-05 NOTE — Telephone Encounter (Signed)
Ok. Done 

## 2019-12-05 NOTE — Telephone Encounter (Signed)
Called and spoke with pt letting her know that Dr. Vaughan Browner sent Rx for hycodan to [facility] on [address] for her and she verbalized understanding. Nothing further needed.

## 2019-12-05 NOTE — Telephone Encounter (Signed)
Patient states Hycodan syrup needs to go to [facility]. Phone number is [phone]. Patient phone number is [phone].

## 2019-12-14 DIAGNOSIS — R918 Other nonspecific abnormal finding of lung field: Secondary | ICD-10-CM

## 2019-12-18 ENCOUNTER — Telehealth: Payer: Self-pay | Admitting: Pulmonary Disease

## 2019-12-18 NOTE — Telephone Encounter (Signed)
Dr Vaughan Browner, please see pt email and advise, thanks  When I saw Dr. Vaughan Browner in Feb. He said my next visit will be a year. At that time my prognosis was for 1 year.  It's been 6 months so I wanted to do an x-ray to see if there are changes from my last x-ray. If I need to make an appointment to ask him this can we do it by phone?  Thank you Heather. [address]

## 2019-12-18 NOTE — Telephone Encounter (Signed)
Called and spoke with pt who is requesting to have her hydrocodone-homatropine cough med refilled. Pt said she wakes up coughing in the middle of the night and this is the only thing that helps with her cough. Pt has taken OTC cough meds (delsym, robitussin, etc) but none of these help with her cough overnight like the hydrocodone-homatropine does.  Dr. Vaughan Browner, please advise if you are okay refilling pt's med. Last refill of med was 8/25 #2109ms.

## 2019-12-19 MED ORDER — HYDROCODONE-HOMATROPINE 5-1.5 MG/5ML PO SYRP: 5.0000 mL | ORAL_SOLUTION | Freq: Four times a day (QID) | ORAL | 0 refills | Status: DC | PRN

## 2019-12-19 MED ORDER — HYDROCODONE-HOMATROPINE 5-1.5 MG/5ML PO SYRP
5.0000 mL | ORAL_SOLUTION | Freq: Four times a day (QID) | ORAL | 0 refills | Status: DC | PRN
Start: 2019-12-19 — End: 2020-01-11

## 2019-12-19 NOTE — Telephone Encounter (Signed)
Ok. Done 

## 2019-12-19 NOTE — Telephone Encounter (Signed)
[facility]  Requesting prescription be sent to [place].  [phone].  Please advise.

## 2019-12-19 NOTE — Telephone Encounter (Signed)
Dr Vaughan Browner- the pharm you sent to is out of medication and pt is requesting that this now be sent to [facility] [address]

## 2019-12-19 NOTE — Telephone Encounter (Signed)
Done

## 2019-12-20 NOTE — Telephone Encounter (Signed)
Called and spoke with pt letting her know that the Rx had been sent and she verbalized understanding. Nothing further needed.

## 2020-01-04 NOTE — Telephone Encounter (Signed)
Apologize for the delayed response If she is interested in reassessing then it might be better to get a CT chest without contrast chest x-ray may not give [place] the complete picture  You can order the CT chest and follow-up with me at next available

## 2020-01-05 ENCOUNTER — Other Ambulatory Visit: Payer: Self-pay | Admitting: Pulmonary Disease

## 2020-01-05 DIAGNOSIS — J849 Interstitial pulmonary disease, unspecified: Secondary | ICD-10-CM

## 2020-01-07 NOTE — Telephone Encounter (Signed)
Message sent to patient, CT ordered. Patient will call to make a follow up appointment after she gets the date of the CT.

## 2020-01-08 ENCOUNTER — Telehealth: Payer: Self-pay | Admitting: Pulmonary Disease

## 2020-01-09 NOTE — Telephone Encounter (Signed)
Called and spoke with pt who is requesting to have the hycodan Rx refilled. Stated to her that we would check with Dr. Vaughan Browner to see if he is okay refilling med and once we heard from  Him we would call her back. Pt verbalized understanding.  While speaking with pt, I went ahead and scheduled her a f/u after CT which the CT is scheduled for 10/7.  Dr. Vaughan Browner, please advise if you are okay refilling pt's hycodan cough syrup. Med was last filled 12/19/19 #225m for pt to take 550m by mouth every 6 hours prn. [facility] [address]

## 2020-01-11 MED ORDER — HYDROCODONE-HOMATROPINE 5-1.5 MG/5ML PO SYRP
5.0000 mL | ORAL_SOLUTION | Freq: Four times a day (QID) | ORAL | 0 refills | Status: DC | PRN
Start: 2020-01-11 — End: 2020-01-22

## 2020-01-11 NOTE — Telephone Encounter (Signed)
OK. Done 

## 2020-01-11 NOTE — Telephone Encounter (Signed)
Spoke with patient. She is aware Dr. Vaughan Browner has sent in the RX for her.   Nothing further needed at time of call.

## 2020-01-17 ENCOUNTER — Ambulatory Visit
Admission: RE | Admit: 2020-01-17 | Discharge: 2020-01-17 | Disposition: A | Discharge status: Home / Self Care | Payer: Medicare HMO | Source: Ambulatory Visit | Attending: Pulmonary Disease | Admitting: Pulmonary Disease

## 2020-01-17 DIAGNOSIS — C3490 Malignant neoplasm of unspecified part of unspecified bronchus or lung: Secondary | ICD-10-CM | POA: Diagnosis not present

## 2020-01-17 DIAGNOSIS — J849 Interstitial pulmonary disease, unspecified: Secondary | ICD-10-CM | POA: Diagnosis not present

## 2020-01-17 DIAGNOSIS — R918 Other nonspecific abnormal finding of lung field: Secondary | ICD-10-CM

## 2020-01-17 DIAGNOSIS — I700 Atherosclerosis of aorta: Secondary | ICD-10-CM | POA: Diagnosis not present

## 2020-01-17 DIAGNOSIS — J432 Centrilobular emphysema: Secondary | ICD-10-CM | POA: Diagnosis not present

## 2020-01-21 ENCOUNTER — Telehealth: Payer: Self-pay | Admitting: Pulmonary Disease

## 2020-01-21 NOTE — Telephone Encounter (Signed)
Called and spoke with pt. Pt is requesting to have her hycodan Rx refilled. Pt said she is having to use the med every day sometimes once during the day and also uses it every night. Pt said she mainly uses it at night as she has problems with restless legs but also states that she uses it due to coughing at night.  Dr. Vaughan Browner, please advise if you are okay refilling med for pt.  [facility] [address]

## 2020-01-22 MED ORDER — HYDROCODONE-HOMATROPINE 5-1.5 MG/5ML PO SYRP
5.0000 mL | ORAL_SOLUTION | Freq: Four times a day (QID) | ORAL | 0 refills | Status: DC | PRN
Start: 2020-01-22 — End: 2020-02-06

## 2020-01-22 NOTE — Telephone Encounter (Signed)
Spoke with the pt and notified that the rx was sent  Nothing further needed

## 2020-01-22 NOTE — Telephone Encounter (Signed)
Ok. refilled

## 2020-01-24 ENCOUNTER — Other Ambulatory Visit: Payer: Medicare HMO | Admitting: Internal Medicine

## 2020-01-24 ENCOUNTER — Telehealth: Payer: Self-pay | Admitting: Pulmonary Disease

## 2020-01-24 DIAGNOSIS — Z515 Encounter for palliative care: Secondary | ICD-10-CM | POA: Diagnosis not present

## 2020-01-24 NOTE — Telephone Encounter (Signed)
Called and spoke with Enid Derry, NP from [facility]. Enid Derry stated she went to go visit with pt and pt pulled up the results of her CT for her to be able to view as she wanted to know the interpretation of it. Enid Derry said that she went over the results of the scan with pt to the best of her ability and also stated she was made aware of pt's upcoming appt with Dr. Vaughan Browner to further go over the results of the CT with pt at that appt.  Enid Derry wanted to know if pt's diagnosis based off of the results of the CT put her with a prognosis of having about 6 months or less to live and also if hospice should start.  She also stated that pt has had more chest heaviness, increased SOB, pain in chest when coughing, and now cannot lay on  Her right side due to pain and all the congestion she has.  Enid Derry also stated if it is okay with Dr. Vaughan Browner, she wants to prescribe pt something to be able to help her sleep at night.  Dr. Vaughan Browner, please advise on this.

## 2020-01-25 MED ORDER — ZOLPIDEM TARTRATE ER 6.25 MG PO TBCR: 6.2500 mg | EXTENDED_RELEASE_TABLET | Freq: Every evening | ORAL | 0 refills | Status: DC | PRN

## 2020-01-25 NOTE — Telephone Encounter (Signed)
Shirley aware of response per Dr Vaughan Browner  Need to clarify ambien dose before calling in- there is not a 6.5 mg but there is a 6.25 mg- is this was you wanted. Just want to be sure. Thanks!

## 2020-01-25 NOTE — Telephone Encounter (Signed)
Yes. Send in prescription for 6.25 mg

## 2020-01-25 NOTE — Telephone Encounter (Signed)
Spoke with Monique Swanson and verified pharm  Rx called in for Ambien  Nothing further needed

## 2020-01-25 NOTE — Telephone Encounter (Signed)
It is hard to tell from the CT scan about 48-monthprognosis. We will have to judge that clinically on how fast she is declining  Please prescribe Ambien 6.5 mg at night for sleep.

## 2020-01-27 NOTE — Progress Notes (Signed)
[facility] Consult Note Telephone: [phone]  Fax: [phone]  PATIENT NAME: Monique Swanson DOB: 05-07-1955 MRN: [id number]  PRIMARY CARE PROVIDER:   Hoyt Koch, MD  REFERRING PROVIDER:  Dr. Marshell Swanson   RESPONSIBLE PARTY:   self      RECOMMENDATIONS and PLAN:  Palliative Care Encounter Z51.5  1.  Advance care planning:    Previously completed DNAR and MOST forms reviewed with patient and her children.  Selections of limited additional interventions, antibiotic and IV therapy if indicated, and no feeding tube remain unchanged.  Pending appt with pulmonologist for further detailed discussion of recent chest CT scan and plan of care.  Consider transition to Hospice care for additional support if pt. Is eligible with a potential less than 6 month life prognosis.  Patient is in agreement with plan.  Palliative care will f/u with patient pending outcome of appt with pulmonologist.   2.  Nausea:  Daily.  Related to progressive disease.  Continue antiemetics as prescribed.  Small, frequent light snacks and meals.  3. Cough and shortness of breath:  Increased occurrences at rest(especially lying on R side) and upon exertion:  Expectation of progressive disease process.  Continue use of Hycodan at bedtime. Continue all respiratory meds, Mucinex.  Consider use of stronger opiod in the future as shortness of breath increases.   4.  Insomnia:  Additional decline and little improvement with use of Melatonin. Currently having to rely on effects of Hycodan to improve insomnia.  Preserve Hycodan for cough only and begin med for insomnia(Restoril, Ambien etc)  4.  Weight loss: Continue Remeron 15 mg at HS.  Comfort feedings and hydration.   5.  Depression:  Increased related to stress from family dynamics and  terminal diagnosis.  Continue Remeron and Lexapro.  Supportive care.  She would benefit for addition of Chaplain and social  worker visits when transition is made to Hospice care.     I spent 60 minutes providing this consultation,  from 1400 to 1500.  More than 50% of the time in this consultation was spent coordinating communication with patient who requested a generalized review of her chest CT results from her My Chart source.  Conferred with Hospice MD. Updates and recommendations provided to nurse of pulmonologist.  HISTORY OF PRESENT ILLNESS: Follow-up with  Monique Swanson. I am here to f/u on additional discussion of advanced care planning related to lung cancer diagnosis, decreased appetite and insomnia.  Palliative Care was asked to help address goals of care.   CODE STATUS: DNAR/DNI  PPS: 50% HOSPICE ELIGIBILITY/DIAGNOSIS: In near future/ Squamous cell lung cancer without plans for  treatment  PAST MEDICAL HISTORY:  Past Medical History:  Diagnosis Date  . Anxiety   . CARCINOMA, SQUAMOUS CELL 2009   s/p rescrtion from R nose bridge  . COPD (chronic obstructive pulmonary disease) ([place])   . DEPRESSION   . Dyspnea   . GERD (gastroesophageal reflux disease)   . Headache(784.0)    migraine- in past rare now- (05/24/2019)  . Hx of cardiovascular stress test    Swanson MV 6/14:  Normal, EF 80%  . Irritable bowel syndrome   . Lymphocytic colitis   . Microscopic colitis dx 02/2009   chronic diarrhea  . Osteoarthritis    ostroporsis  . OSTEOPENIA   . Osteoporosis, unspecified 09/30/2008   LeB DEXA 05/2012: -2.7, recommended to start bisphos    . PTSD (post-traumatic stress disorder)  PERTINENT MEDICATIONS:  Outpatient Encounter Medications as of 09/11/2019  Medication Sig  . acetaminophen (TYLENOL) 500 MG tablet Take 1,000 mg by mouth every 6 (six) hours as needed for mild pain.   Marland Kitchen albuterol (VENTOLIN HFA) 108 (90 Base) MCG/ACT inhaler TAKE 2 PUFFS BY MOUTH EVERY 6 HOURS AS NEEDED FOR WHEEZE OR SHORTNESS OF BREATH  . ANORO ELLIPTA 62.5-25 MCG/INH AEPB TAKE 1 PUFF BY MOUTH EVERY DAY  .  benzonatate (TESSALON) 200 MG capsule Take 1 capsule (200 mg total) by mouth 2 (two) times daily as needed for cough.  . budesonide (ENTOCORT EC) 3 MG 24 hr capsule Take 3 mg by mouth at bedtime.   . cholecalciferol (VITAMIN D) 1000 UNITS tablet Take 1 tablet (1,000 Units total) by mouth daily. (Patient taking differently: Take 1,000 Units by mouth at bedtime. )  . dicyclomine (BENTYL) 10 MG capsule Take 10 mg by mouth 4 (four) times daily -  before meals and at bedtime.  Marland Kitchen escitalopram (LEXAPRO) 20 MG tablet Take 1 tablet (20 mg total) by mouth daily.  . fluticasone (FLONASE) 50 MCG/ACT nasal spray SPRAY 2 SPRAYS INTO EACH NOSTRIL EVERY DAY  . folic acid (FOLVITE) 1 MG tablet Take 1 tablet (1 mg total) by mouth daily.  Marland Kitchen HYDROcodone-homatropine (HYCODAN) 5-1.5 MG/5ML syrup Take 5 mLs by mouth every 6 (six) hours as needed for cough.  Marland Kitchen HYDROcodone-homatropine (HYCODAN) 5-1.5 MG/5ML syrup Take 5 mLs by mouth every 6 (six) hours as needed for cough.  Marland Kitchen imipramine (TOFRANIL) 50 MG tablet TAKE 1 TABLET BY MOUTH EVERYDAY AT BEDTIME (Patient taking differently: Take 50 mg by mouth at bedtime. )  . ondansetron (ZOFRAN) 4 MG tablet Take 1 tablet (4 mg total) by mouth 3 (three) times daily as needed for nausea or vomiting.   No facility-administered encounter medications on file as of 09/11/2019.    PHYSICAL EXAM:   General: NAD, chronically ill and frail appearing, thin Cardiovascular: regular rate and rhythm Pulmonary: diminished breath sounds R upper and lower lobes with very congested productive cough.  O2 sats 96% Abdomen: soft, nontender, + bowel sounds Extremities: thin and decreased muscle mass Skin: exposed skin is intact Neurological: alert and oriented x3, generalized weakness Psych:  Depressed mood.  Tearful intermittently. No SI.  Monique Lex, NP-C

## 2020-01-29 ENCOUNTER — Telehealth: Payer: Self-pay | Admitting: Pulmonary Disease

## 2020-01-30 NOTE — Telephone Encounter (Signed)
Spoke to patient, who stated [facility]. [facility] and was placed on [facility] for >5mn.  Will call back.

## 2020-02-01 ENCOUNTER — Other Ambulatory Visit: Payer: Self-pay

## 2020-02-01 ENCOUNTER — Encounter: Payer: Self-pay | Admitting: Pulmonary Disease

## 2020-02-01 ENCOUNTER — Ambulatory Visit (INDEPENDENT_AMBULATORY_CARE_PROVIDER_SITE_OTHER): Payer: Medicare HMO | Admitting: Pulmonary Disease

## 2020-02-01 VITALS — BP 106/60 | HR 80 | Temp 97.7°F | Ht 62.0 in | Wt 119.4 lb

## 2020-02-01 DIAGNOSIS — C3411 Malignant neoplasm of upper lobe, right bronchus or lung: Secondary | ICD-10-CM | POA: Diagnosis not present

## 2020-02-01 DIAGNOSIS — J849 Interstitial pulmonary disease, unspecified: Secondary | ICD-10-CM

## 2020-02-01 MED ORDER — ZOLPIDEM TARTRATE 10 MG PO TABS: 10.0000 mg | ORAL_TABLET | Freq: Every evening | ORAL | 5 refills | Status: DC | PRN

## 2020-02-01 MED ORDER — ZOLPIDEM TARTRATE 5 MG PO TABS: 5.0000 mg | ORAL_TABLET | Freq: Every evening | ORAL | 5 refills | Status: DC | PRN

## 2020-02-01 NOTE — Patient Instructions (Signed)
I have reviewed your CT scan which shows progression of lung cancer Continue supportive care as we talked about Hycodan for cough  We will change the Ambien CR to regular Ambien since the form was not covered by your insurance  Follow-up in 6 months.

## 2020-02-01 NOTE — Telephone Encounter (Signed)
Spoke to [place] [facility], who stated that Ambien CR is not covered by insurance, however plain Ambien is covered.   Dr. Vaughan Browner, please advise.

## 2020-02-01 NOTE — Progress Notes (Signed)
Monique Swanson    [id number]    1955-04-21  Primary Care Physician:Crawford, Real Cons, MD  Referring Physician: Hoyt Koch, MD [address] [place],  [place] [address]  Chief complaint: Follow up for lung cancer, pulmonary fibrosis  HPI: 64 year old smoker with history of ulcerative colitis, squamous cell cancer of the skin status post resection in 2014, osteoporosis  She had a CT scan in 2016 which showed evidence of interstitial lung disease, pulmonary fibrosis.  This was redemonstrated on recent chest x-ray and he has been referred for evaluation. History notable for ulcerative colitis for which she sees Dr. Alessandra Bevels at [facility] and is on budesonide with occasional prednisone tapers  She had a CT scan in Jan 2021 which showed a cavitary lung lesion which was positive on PET scan Bronchoscope on 05/29/19 with diagnosis of squamous cell cancer. We have referred her to oncology but patient canceled appointment She states that she does not want any further work-up or treatment for cancer.  Pets: Has a dog.  No cats, birds, farm animals Occupation: Currently on disability.  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  No hot tub, Jacuzzi or feather pillows or comforters Smoking history: 50-pack-year smoker.  Continues to smoke 1 pack/day Travel history: No significant travel history Relevant family history: Mom and sister have COPD.  They were smokers.  Interim history: Continues to have cough which is worsened in the interim.  She is taking Hycodan for that Has some pain on the right side.  Here for review of recent CT.  Outpatient Encounter Medications as of 02/01/2020  Medication Sig  . acetaminophen (TYLENOL) 500 MG tablet Take 1,000 mg by mouth every 6 (six) hours as needed for mild pain.   Marland Kitchen albuterol (VENTOLIN HFA) 108 (90 Base) MCG/ACT inhaler  TAKE 2 PUFFS BY MOUTH EVERY 6 HOURS AS NEEDED FOR WHEEZE OR SHORTNESS OF BREATH  . ANORO ELLIPTA 62.5-25 MCG/INH AEPB INHALE 1 PUFF BY MOUTH EVERY DAY  . benzonatate (TESSALON) 200 MG capsule Take 1 capsule (200 mg total) by mouth 2 (two) times daily as needed for cough.  . budesonide (ENTOCORT EC) 3 MG 24 hr capsule Take 3 mg by mouth at bedtime.   . cholecalciferol (VITAMIN D) 1000 UNITS tablet Take 1 tablet (1,000 Units total) by mouth daily. (Patient taking differently: Take 1,000 Units by mouth at bedtime. )  . dicyclomine (BENTYL) 10 MG capsule Take 10 mg by mouth 4 (four) times daily -  before meals and at bedtime.  Marland Kitchen escitalopram (LEXAPRO) 20 MG tablet Take 1 tablet (20 mg total) by mouth daily.  . fluticasone (FLONASE) 50 MCG/ACT nasal spray SPRAY 2 SPRAYS INTO EACH NOSTRIL EVERY DAY  . folic acid (FOLVITE) 1 MG tablet Take 1 tablet (1 mg total) by mouth daily.  Marland Kitchen HYDROcodone-homatropine (HYCODAN) 5-1.5 MG/5ML syrup Take 5 mLs by mouth every 6 (six) hours as needed for cough.  Marland Kitchen imipramine (TOFRANIL) 50 MG tablet TAKE 1 TABLET BY MOUTH EVERYDAY AT BEDTIME (Patient taking differently: Take 50 mg by mouth at bedtime. )  . ondansetron (ZOFRAN) 4 MG tablet Take 1 tablet (4 mg total) by mouth 3 (three) times daily as needed for nausea or vomiting.  Marland Kitchen zolpidem (AMBIEN CR) 6.25 MG CR tablet Take 1 tablet (6.25 mg total) by mouth at bedtime as needed for sleep. (Patient not taking:  Reported on 02/01/2020)   No facility-administered encounter medications on file as of 02/01/2020.   Physical Exam: Blood pressure 106/60, pulse 80, temperature 97.7 F (36.5 C), temperature source Skin, height 5' 2"  (1.575 m), weight 119 lb 6.4 oz (54.2 kg), SpO2 95 %. Gen:      No acute distress HEENT:  EOMI, sclera anicteric Neck:     No masses; no thyromegaly Lungs:    Clear to auscultation bilaterally; normal respiratory effort CV:         Regular rate and rhythm; no murmurs Abd:      + bowel sounds; soft,  non-tender; no palpable masses, no distension Ext:    No edema; adequate peripheral perfusion Skin:      Warm and dry; no rash Neuro: alert and oriented x 3 Psych: normal mood and affect  Data Reviewed: Imaging: CTA 07/31/2014-no pulmonary embolism, subpleural fibrosis with groundglass and emphysema.  Evidence of prior granulomatous exposure.  High-res CT chest 05/03/2019-3 right middle lobe nodule.  Left upper lobe and left lower lobe pulmonary nodules, moderate emphysema, pulmonary fibrosis and indeterminate pattern  PET scan 05/16/2019-Hypermetabolic pulmonary nodules in right middle lobe, left upper lobe, and left lower lobe positive mediastinal and hilar lymph nodes.  CT scan 01/17/2020-increase in size of bilateral pulmonary nodules and mass with cavitation, increasing mediastinal lymphadenopathy.  Chronic interstitial lung disease. I have reviewed the images personally.  PFTs  06/08/2019 FVC 2.78 [93%], FEV1 2.09 [91%], F/F 75, TLC 4.20 [88%], DLCO 7.58 [40%) Severe diffusion defect  Labs: CTD serologies 04/22/2019-negative  Assessment:  Squamous cell cancer She has a new diagnosis of lung cancer after recent bronchoscopy Patient has elected not to proceed with treatment and canceled her oncology appointments.   Continue monitoring.  Interstitial lung disease CT reviewed with changes of pulmonary fibrosis in 2016 History notable for possible mold exposure.   Work-up for CTD serologies are negative She could have hypersensitivity pneumonitis but deferring invasive work-up due to poor overall prognosis Hycodan for cough   Emphysema Continue Anoro  Active smoker Smoking cessation discussed but she is not ready to quit at present.    Goals of care Reviewed in detail.  She is in palliative care right now.  They are inquiring if she is ready for hospice but even though the lung cancer has progressed she appears functional and perhaps not ready for comfort care yet.   Plan/Recommendations: Hycodan for cough Continue Anoro Palliative care, DNR  Marshell Garfinkel MD [facility] 02/01/2020, 9:30 AM  CC: Hoyt Koch, *

## 2020-02-01 NOTE — Addendum Note (Signed)
Addended by: Elton Sin on: 02/01/2020 03:00 PM   Modules accepted: Orders

## 2020-02-06 ENCOUNTER — Other Ambulatory Visit: Payer: Self-pay | Admitting: Internal Medicine

## 2020-02-06 ENCOUNTER — Telehealth: Payer: Self-pay | Admitting: Pulmonary Disease

## 2020-02-06 DIAGNOSIS — F331 Major depressive disorder, recurrent, moderate: Secondary | ICD-10-CM

## 2020-02-06 MED ORDER — HYDROCODONE-HOMATROPINE 5-1.5 MG/5ML PO SYRP: 5.0000 mL | ORAL_SOLUTION | Freq: Four times a day (QID) | ORAL | 0 refills | Status: DC | PRN

## 2020-02-06 NOTE — Telephone Encounter (Signed)
Called and spoke to patient, who is requesting refill on Hycodan 5-1.77m. Last refilled 01/22/20 #[id number] PRN.  Patient stated that she is using Hycodan QHS.  Dr. MaVaughan Brownerplease advise. Thanks

## 2020-02-06 NOTE — Telephone Encounter (Signed)
Ok. refilled

## 2020-02-06 NOTE — Telephone Encounter (Signed)
Patient is aware of below message and voiced her understanding.  Nothing further needed.   

## 2020-02-14 NOTE — Telephone Encounter (Signed)
She has a prescription for regular plain Ambien 5 mg at night already sent in on 10/22

## 2020-02-28 ENCOUNTER — Other Ambulatory Visit: Payer: Self-pay | Admitting: Pulmonary Disease

## 2020-02-28 ENCOUNTER — Telehealth: Payer: Self-pay | Admitting: Pulmonary Disease

## 2020-02-28 MED ORDER — HYDROCODONE-HOMATROPINE 5-1.5 MG/5ML PO SYRP: 5.0000 mL | ORAL_SOLUTION | Freq: Four times a day (QID) | ORAL | 0 refills | Status: DC | PRN

## 2020-02-28 NOTE — Telephone Encounter (Signed)
Spoke with the pt and notified that her rx was sent

## 2020-02-28 NOTE — Telephone Encounter (Signed)
Refilled

## 2020-02-28 NOTE — Telephone Encounter (Signed)
Spoke with the pt  She is asking for a refill on her Hycodan  Last given #240 ml on 02/06/20 She still has enough for approx 2 nights  [facility] [address]

## 2020-03-05 ENCOUNTER — Telehealth: Payer: Self-pay | Admitting: Pulmonary Disease

## 2020-03-05 MED ORDER — HYDROCODONE-HOMATROPINE 5-1.5 MG/5ML PO SYRP
5.0000 mL | ORAL_SOLUTION | Freq: Four times a day (QID) | ORAL | 0 refills | Status: DC | PRN
Start: 2020-03-05 — End: 2020-03-26

## 2020-03-05 NOTE — Telephone Encounter (Signed)
New prescription sent in to [facility] on [address]

## 2020-03-05 NOTE — Telephone Encounter (Signed)
Pt calling and stated that she is out of the hydromet and that [facility].  I called the pharmacy and they stated that this is in fact on manufacturer backorder.  Pt is requesting that a refill of the hydromet be sent to [facility] on [address] in [place].  PM please advise. Thanks  Allergies  Allergen Reactions  . Tetracycline Other (See Comments)    Ulcers on tongue     Last refill for hycodan was 02/28/2020 for 239m Last OV was 02/01/2020

## 2020-03-26 ENCOUNTER — Telehealth: Payer: Self-pay | Admitting: Pulmonary Disease

## 2020-03-26 ENCOUNTER — Other Ambulatory Visit: Payer: Self-pay | Admitting: Internal Medicine

## 2020-03-26 DIAGNOSIS — F331 Major depressive disorder, recurrent, moderate: Secondary | ICD-10-CM

## 2020-03-26 MED ORDER — HYDROCODONE-HOMATROPINE 5-1.5 MG/5ML PO SYRP: 5.0000 mL | ORAL_SOLUTION | Freq: Four times a day (QID) | ORAL | 0 refills | Status: DC | PRN

## 2020-03-26 NOTE — Telephone Encounter (Signed)
Done. Refill order placed.

## 2020-03-26 NOTE — Telephone Encounter (Signed)
03/26/20  Patient requesting refill of Hydromet cough syrup this was last refilled per [place] PMP aware on 03/05/2020.  Prescription for Hydromet 240 mL was prescribed on 03/05/2020.  Patient is requesting refill of this today.  Patient is requesting that it be sent to [facility] on [address].  We will route to Dr. Vaughan Browner for follow-up  Please advise Dr. Bebe Liter, FNP

## 2020-03-27 ENCOUNTER — Other Ambulatory Visit: Payer: Self-pay | Admitting: Gastroenterology

## 2020-03-27 DIAGNOSIS — R1310 Dysphagia, unspecified: Secondary | ICD-10-CM

## 2020-03-31 ENCOUNTER — Other Ambulatory Visit: Payer: Self-pay | Admitting: Pulmonary Disease

## 2020-03-31 NOTE — Telephone Encounter (Signed)
[facility] a [facility] issue and hycodan has been on backorder in all stores X 1 month.   Pt also uses [facility] on [address].  This pharmacy verified that they have Hycodan in stock.  Spoke with pt, requesting that we send a new rx to [facility] as she wasn't able to get [facility].   I have pended order to correct pharmacy.  Dr. Vaughan Browner please advise on refill.  Thanks!

## 2020-04-01 MED ORDER — HYDROCODONE-HOMATROPINE 5-1.5 MG/5ML PO SYRP: 5.0000 mL | ORAL_SOLUTION | Freq: Four times a day (QID) | ORAL | 0 refills | Status: DC | PRN

## 2020-04-02 ENCOUNTER — Ambulatory Visit
Admission: RE | Admit: 2020-04-02 | Discharge: 2020-04-02 | Disposition: A | Discharge status: Home / Self Care | Payer: Medicare HMO | Source: Ambulatory Visit | Attending: Gastroenterology | Admitting: Gastroenterology

## 2020-04-02 ENCOUNTER — Other Ambulatory Visit: Payer: Self-pay | Admitting: Gastroenterology

## 2020-04-02 DIAGNOSIS — R1310 Dysphagia, unspecified: Secondary | ICD-10-CM

## 2020-04-02 DIAGNOSIS — K2289 Other specified disease of esophagus: Secondary | ICD-10-CM | POA: Diagnosis not present

## 2020-04-03 ENCOUNTER — Other Ambulatory Visit: Payer: Self-pay | Admitting: Pulmonary Disease

## 2020-04-08 DIAGNOSIS — R1310 Dysphagia, unspecified: Secondary | ICD-10-CM | POA: Diagnosis not present

## 2020-04-08 DIAGNOSIS — K219 Gastro-esophageal reflux disease without esophagitis: Secondary | ICD-10-CM | POA: Diagnosis not present

## 2020-04-08 DIAGNOSIS — K52832 Lymphocytic colitis: Secondary | ICD-10-CM | POA: Diagnosis not present

## 2020-04-10 ENCOUNTER — Other Ambulatory Visit: Payer: Medicare HMO | Admitting: Adult Health Nurse Practitioner

## 2020-04-10 ENCOUNTER — Other Ambulatory Visit: Payer: Self-pay

## 2020-04-10 DIAGNOSIS — F331 Major depressive disorder, recurrent, moderate: Secondary | ICD-10-CM

## 2020-04-10 DIAGNOSIS — G4700 Insomnia, unspecified: Secondary | ICD-10-CM

## 2020-04-10 DIAGNOSIS — Z515 Encounter for palliative care: Secondary | ICD-10-CM

## 2020-04-10 DIAGNOSIS — R69 Illness, unspecified: Secondary | ICD-10-CM | POA: Diagnosis not present

## 2020-04-10 DIAGNOSIS — C3491 Malignant neoplasm of unspecified part of right bronchus or lung: Secondary | ICD-10-CM | POA: Diagnosis not present

## 2020-04-10 NOTE — Progress Notes (Signed)
[facility] Consult Note Telephone: [phone]  Fax: [phone]  PATIENT NAME: Monique Swanson DOB: 11-21-1955 MRN: [id number]  PRIMARY CARE PROVIDER:   Hoyt Koch, MD  REFERRING PROVIDER:  Dr. Marshell Garfinkel   RESPONSIBLE PARTY:   self  [phone]  Due to Covid-19 crisis, this visit was done by telemedicine and it was initiated and consent by this patient and/or family.  Video-audio (telehealth) contact was unable to be done due to technical barriers from the patient's side.    Chief complaint:  Follow up palliative visit/insomnia  (seeing this patient for another palliative provider)    RECOMMENDATIONS and PLAN:  1.  Advanced care planning.  Patient is DNR/DNI  2.  Lung cancer.  Not seeking any further work up or treatment.  Is getting weaker but is still independent with ADLs and IADLs.  Did discuss help in the home and with the help she gets from her grandsons she does not feel like she needs it at this time.  She is aware that she will need extra help in the future as her disease progresses.  3. Insomnia.  Patient is getting good relief with the ambien.  Continue ambien as prescribed  4.  Depression.  Patient getting good relief on remeron 15 mg and lexapro 20 mg.  She states needing refill on remeron.  Unsure if getting this through palliative provider or PCP.  Have reached out to palliative provider to clarify.   Palliative will continue to monitor for symptom management/decline and make recommendations as needed.  Will have scheduler reach out to her for next follow up appointment.  HISTORY OF PRESENT ILLNESS:  [place] is a 65 y.o. year old female with multiple medical problems including squamous cell lung cancer, pulmonary fibrosis, COPD, depression, IBS, OA, PTSD. Palliative Care was asked to help address goals of care.Have reviewed chart and most recent labs and CT of chest.  she does have lung  masses increasing and size and lymph node involvement.  Patient endorses having more dyspnea on exertion.  States that whenever she checks her O2 saturation the lowest it has been is 94%.  She does not need supplemental O2 at this time.  She does state that she also gets weaker and more fatigued by the end of the day and has to take frequent rest breaks.  She is still independent with ADLs and IADLs.  Her 2 grandsons, whom she cares for full time, do help her with house cleaning when she needs it.  States having episodes of feeling like food getting stuck in her throat.  Had barium swallow and this did not show anything concerning.  States her appetite is good with no reported weight loss.  She has not had any falls.  She has started [place] for insomnia and states that this gives her good relief and she sleeps throughout the night.  Rest of 10 point ROS asked and negative.    CODE STATUS: DNAR/DNI  PPS: 50% HOSPICE ELIGIBILITY/DIAGNOSIS: In near future/ Squamous cell lung cancer without plans for  treatment  PHYSICAL EXAM:   Deferred  PAST MEDICAL HISTORY:  Past Medical History:  Diagnosis Date  . Anxiety   . CARCINOMA, SQUAMOUS CELL 2009   s/p rescrtion from R nose bridge  . COPD (chronic obstructive pulmonary disease) ([place])   . DEPRESSION   . Dyspnea   . GERD (gastroesophageal reflux disease)   . Headache(784.0)    migraine- in past  rare now- (05/24/2019)  . Hx of cardiovascular stress test    Lex MV 6/14:  Normal, EF 80%  . Irritable bowel syndrome   . Lymphocytic colitis   . Microscopic colitis dx 02/2009   chronic diarrhea  . Osteoarthritis    ostroporsis  . OSTEOPENIA   . Osteoporosis, unspecified 09/30/2008   LeB DEXA 05/2012: -2.7, recommended to start bisphos    . PTSD (post-traumatic stress disorder)     SOCIAL HX:  Social History   Tobacco Use  . Smoking status: Current Every Day Smoker    Packs/day: 1.00    Years: 35.00    Pack years: 35.00    Types: Cigarettes   . Smokeless tobacco: Never Used  Substance Use Topics  . Alcohol use: No    ALLERGIES:  Allergies  Allergen Reactions  . Tetracycline Other (See Comments)    Ulcers on tongue     PERTINENT MEDICATIONS:  Outpatient Encounter Medications as of 04/10/2020  Medication Sig  . acetaminophen (TYLENOL) 500 MG tablet Take 1,000 mg by mouth every 6 (six) hours as needed for mild pain.   Marland Kitchen albuterol (VENTOLIN HFA) 108 (90 Base) MCG/ACT inhaler TAKE 2 PUFFS BY MOUTH EVERY 6 HOURS AS NEEDED FOR WHEEZE OR SHORTNESS OF BREATH  . ANORO ELLIPTA 62.5-25 MCG/INH AEPB INHALE 1 PUFF BY MOUTH EVERY DAY  . benzonatate (TESSALON) 200 MG capsule Take 1 capsule (200 mg total) by mouth 2 (two) times daily as needed for cough.  . budesonide (ENTOCORT EC) 3 MG 24 hr capsule Take 3 mg by mouth at bedtime.   . cholecalciferol (VITAMIN D) 1000 UNITS tablet Take 1 tablet (1,000 Units total) by mouth daily. (Patient taking differently: Take 1,000 Units by mouth at bedtime. )  . dicyclomine (BENTYL) 10 MG capsule Take 10 mg by mouth 4 (four) times daily -  before meals and at bedtime.  Marland Kitchen escitalopram (LEXAPRO) 20 MG tablet Take 1 tablet (20 mg total) by mouth daily.  . fluticasone (FLONASE) 50 MCG/ACT nasal spray SPRAY 2 SPRAYS INTO EACH NOSTRIL EVERY DAY  . folic acid (FOLVITE) 1 MG tablet Take 1 tablet (1 mg total) by mouth daily.  Marland Kitchen HYDROcodone-homatropine (HYCODAN) 5-1.5 MG/5ML syrup Take 5 mLs by mouth every 6 (six) hours as needed for cough.  Marland Kitchen imipramine (TOFRANIL) 50 MG tablet TAKE 1 TABLET BY MOUTH EVERYDAY AT BEDTIME  . ondansetron (ZOFRAN) 4 MG tablet TAKE 1 TABLET (4 MG TOTAL) BY MOUTH 3 (THREE) TIMES DAILY AS NEEDED FOR NAUSEA OR VOMITING.  Marland Kitchen zolpidem (AMBIEN) 5 MG tablet Take 1 tablet (5 mg total) by mouth at bedtime as needed for sleep.   No facility-administered encounter medications on file as of 04/10/2020.       Sylvi Rybolt Jenetta Downer, NP

## 2020-04-21 ENCOUNTER — Telehealth: Payer: Self-pay | Admitting: Pulmonary Disease

## 2020-04-21 MED ORDER — HYDROCODONE-HOMATROPINE 5-1.5 MG/5ML PO SYRP: 5.0000 mL | ORAL_SOLUTION | Freq: Four times a day (QID) | ORAL | 0 refills | Status: DC | PRN

## 2020-04-21 NOTE — Telephone Encounter (Signed)
I have renewed Hycodan cough syrup  Since she is established with palliative care can she check with them if they can take over this prescription?

## 2020-04-21 NOTE — Telephone Encounter (Signed)
Spoke with the pt and notified of response per Dr Vaughan Browner  She verbalized understanding  She states that she will ask her palliative care nurse about how to them this refilled through them  Nothing further needed

## 2020-04-21 NOTE — Telephone Encounter (Signed)
Called and spoke with Patient.  Patient requested a refill of Hycodan to be sent to [facility].   Last refill 04/01/20- Hycodan 86m every 6 hours as needed for cough, #[id number] no refills  LOV 02/01/20   Message routed to Dr. MaVaughan Brownero advise

## 2020-04-22 ENCOUNTER — Encounter: Payer: Self-pay | Admitting: Internal Medicine

## 2020-04-22 ENCOUNTER — Telehealth (INDEPENDENT_AMBULATORY_CARE_PROVIDER_SITE_OTHER): Payer: Medicare HMO | Admitting: Internal Medicine

## 2020-04-22 DIAGNOSIS — R69 Illness, unspecified: Secondary | ICD-10-CM | POA: Diagnosis not present

## 2020-04-22 DIAGNOSIS — F331 Major depressive disorder, recurrent, moderate: Secondary | ICD-10-CM | POA: Diagnosis not present

## 2020-04-22 DIAGNOSIS — J42 Unspecified chronic bronchitis: Secondary | ICD-10-CM

## 2020-04-22 MED ORDER — IMIPRAMINE HCL 50 MG PO TABS
50.0000 mg | ORAL_TABLET | Freq: Every day | ORAL | 3 refills | Status: DC
Start: 2020-04-22 — End: 2022-07-23

## 2020-04-22 MED ORDER — MIRTAZAPINE 15 MG PO TABS: 15.0000 mg | ORAL_TABLET | Freq: Every day | ORAL | 3 refills | Status: DC

## 2020-04-22 MED ORDER — ESCITALOPRAM OXALATE 20 MG PO TABS: 20.0000 mg | ORAL_TABLET | Freq: Every day | ORAL | 3 refills | Status: DC

## 2020-04-22 NOTE — Progress Notes (Signed)
Virtual Visit via Video Note  I connected with [place] on 04/22/20 at  3:20 PM EST by a video enabled telemedicine application and verified that I am speaking with the correct person using two identifiers.  The patient and the provider were at separate locations throughout the entire encounter. Patient location: home, Provider location: work   I discussed the limitations of evaluation and management by telemedicine and the availability of in person appointments. The patient expressed understanding and agreed to proceed. The patient and the provider were the only parties present for the visit unless noted in HPI below.  History of Present Illness: The patient is a 65 y.o. female with visit for med follow up. New lung cancer diagnosis since our last visit. She is under care of palliative medicine. She is also seeing pulmonary. She is struggling with her emotional health since the past year and overall feels okay but not great. Needs some medications as her provider with palliative care has left and some of her refills are not filled. She has not had covid-19 vaccine yet. Does not leave house but lives with 2 grandchildren.   Observations/Objective: Appearance: normal, some minimal coughing during visit, breathing appears normal, casual grooming, abdomen does not appear distended, memory normal, mental status is A and O times 3  Assessment and Plan: See problem oriented charting  Follow Up Instructions: refill meds as needed, can fill ambien or hycodan cough syrup for her if needed  I discussed the assessment and treatment plan with the patient. The patient was provided an opportunity to ask questions and all were answered. The patient agreed with the plan and demonstrated an understanding of the instructions.   The patient was advised to call back or seek an in-person evaluation if the symptoms worsen or if the condition fails to improve as anticipated.  Hoyt Koch, MD

## 2020-04-23 ENCOUNTER — Telehealth: Payer: Self-pay | Admitting: Pulmonary Disease

## 2020-04-23 NOTE — Telephone Encounter (Signed)
[facility] called [facility] [address] and spoke with pharmacy.  [facility] [address] [facility] of [facility] and Hycodan is on back order. Patient request Hycodan prescription be sent to [place] [facility]. [place] [facility]. [facility] does have Hydromet in stock.  Message routed to Dr. Vaughan Browner to advise

## 2020-04-24 MED ORDER — HYDROCODONE-HOMATROPINE 5-1.5 MG/5ML PO SYRP: 5.0000 mL | ORAL_SOLUTION | Freq: Four times a day (QID) | ORAL | 0 refills | Status: DC | PRN

## 2020-04-24 NOTE — Telephone Encounter (Signed)
Called and spoke with pt letting her know that Dr. Vaughan Browner refilled her medication for her and she verbalized understanding.nothing further needed.

## 2020-04-24 NOTE — Telephone Encounter (Signed)
Ok. Done 

## 2020-04-25 ENCOUNTER — Encounter: Payer: Self-pay | Admitting: Internal Medicine

## 2020-04-25 NOTE — Assessment & Plan Note (Signed)
Refill medications including remeron today. She is struggling some with emotional health and is trying other strategies as well as medications.

## 2020-04-25 NOTE — Assessment & Plan Note (Signed)
Stable symptoms and seeing pulmonary who adjusts her inhalers as needed.

## 2020-05-07 ENCOUNTER — Other Ambulatory Visit: Payer: Self-pay | Admitting: Pulmonary Disease

## 2020-05-23 ENCOUNTER — Telehealth: Payer: Self-pay | Admitting: Internal Medicine

## 2020-05-23 NOTE — Telephone Encounter (Signed)
LVM for pt to rtn my call to schedule awv with nha. Please schedule awv if pt calls the office.

## 2020-05-30 DIAGNOSIS — J8410 Pulmonary fibrosis, unspecified: Secondary | ICD-10-CM | POA: Diagnosis not present

## 2020-05-30 DIAGNOSIS — Z85118 Personal history of other malignant neoplasm of bronchus and lung: Secondary | ICD-10-CM | POA: Diagnosis not present

## 2020-05-30 DIAGNOSIS — G4700 Insomnia, unspecified: Secondary | ICD-10-CM | POA: Diagnosis not present

## 2020-05-30 DIAGNOSIS — K5190 Ulcerative colitis, unspecified, without complications: Secondary | ICD-10-CM | POA: Diagnosis not present

## 2020-05-30 DIAGNOSIS — K219 Gastro-esophageal reflux disease without esophagitis: Secondary | ICD-10-CM | POA: Diagnosis not present

## 2020-05-30 DIAGNOSIS — R69 Illness, unspecified: Secondary | ICD-10-CM | POA: Diagnosis not present

## 2020-05-30 DIAGNOSIS — Z720 Tobacco use: Secondary | ICD-10-CM | POA: Diagnosis not present

## 2020-05-30 DIAGNOSIS — Z8249 Family history of ischemic heart disease and other diseases of the circulatory system: Secondary | ICD-10-CM | POA: Diagnosis not present

## 2020-05-30 DIAGNOSIS — J309 Allergic rhinitis, unspecified: Secondary | ICD-10-CM | POA: Diagnosis not present

## 2020-05-30 DIAGNOSIS — Z7951 Long term (current) use of inhaled steroids: Secondary | ICD-10-CM | POA: Diagnosis not present

## 2020-06-02 ENCOUNTER — Telehealth: Payer: Self-pay | Admitting: Internal Medicine

## 2020-06-02 NOTE — Telephone Encounter (Signed)
1.Medication Requested: HYDROcodone-homatropine (HYCODAN) 5-1.5 MG/5ML syrup    2. Pharmacy (Name, [place], [place]): [facility] [phone] - [place], [place] - [address] AT [place]  3. On Med List: yes   4. Last Visit with PCP: 1.11.22  5. Next visit date with PCP: n/a      Agent: Please be advised that RX refills may take up to 3 business days. We ask that you follow-up with your pharmacy.

## 2020-06-04 MED ORDER — HYDROCODONE-HOMATROPINE 5-1.5 MG/5ML PO SYRP
5.0000 mL | ORAL_SOLUTION | Freq: Four times a day (QID) | ORAL | 0 refills | Status: DC | PRN
Start: 2020-06-04 — End: 2020-06-09

## 2020-06-04 NOTE — Telephone Encounter (Signed)
See below

## 2020-06-08 ENCOUNTER — Encounter: Payer: Self-pay | Admitting: Internal Medicine

## 2020-06-09 MED ORDER — HYDROCODONE-HOMATROPINE 5-1.5 MG/5ML PO SYRP: 5.0000 mL | ORAL_SOLUTION | Freq: Four times a day (QID) | ORAL | 0 refills | Status: DC | PRN

## 2020-06-25 ENCOUNTER — Other Ambulatory Visit: Payer: Medicare HMO | Admitting: Student

## 2020-06-25 ENCOUNTER — Other Ambulatory Visit: Payer: Self-pay

## 2020-06-25 DIAGNOSIS — Z515 Encounter for palliative care: Secondary | ICD-10-CM

## 2020-06-25 NOTE — Progress Notes (Signed)
[facility] Consult Note Telephone: [phone]  Fax: [phone]  PATIENT NAME: Monique Swanson [address] [place] [address] [phone] (home)  DOB: 1955/12/28 MRN: [id number]  PRIMARY CARE PROVIDER:    Hoyt Koch, MD,  [place] [address] [phone]  REFERRING PROVIDER:   Hoyt Koch, MD [address] [place],  [place] [address] [phone]  RESPONSIBLE PARTY:   Extended Emergency Contact Information Primary Emergency Contact: Monique Swanson, Monique Swanson of [place] Phone: [phone] Work Phone: [phone] Relation: Son  I met face to face with patient in her home.    ASSESSMENT AND RECOMMENDATIONS:   Advance Care Planning: Visit at the request of Dr. Sharlet Swanson for palliative consult. Visit consisted of building trust and discussions on Palliative care medicine as specialized medical care for people living with serious illness, aimed at facilitating improved quality of life through symptoms relief, assisting with advance care planning and establishing goals of care. Family expressed appreciation for education provided on Palliative care and how it differs from Hospice service. Palliative care will continue to provide support to patient, family and the medical team.  Patient confirms that she does not want any further work up for the squamous cell lung cancer. Discussed Palliative Medicine continuing to be involved; assess for further changes and declines. Will transition to Hospice in the near future.   Goal of care: To be able to take care of her grand children.   Directives: DNR  Symptom Management:   Lung Cancer-patient is not seeking any further treatment. She does report fatigue, shortness of breath. She is able to complete adl's, declines need for assistance in the home at this time.  Colitis-patient with increased loose stools x past  3 months. She had tapered her budesonide down to 53m daily per GI. Patient is to reach out to GI regarding having budesonide increased; recommend increase budesonide after consulting with GI.  Continue imodium PRN.  Nausea-patient with nausea. Recommend ondansetron 414mTID PRN; continue pantoprazole 4033maily; instructed to take on empty stomach.   Appetite-patient endorses fair appetite; she reports a 30 pound loss over past year. Continue mirtazapine 25m58mS. Encourage foods she enjoys, adequate fluid intake.   Cough/shortness of breath-Patient with COPD, right lung squamous cell carcinoma. Increased shortness of breath with exertion. Continue inhalers as directed. Hycodan PRN at bedtime. Refill sent to [facility] for tessalon perles 200mg31m PRN.  Depression-patient states her mood has been okay. She is caring for two grand children, who recently lost their father. Continue lexapro and mirtazapine as directed. Will continue to provide supportive care.   Follow up Palliative Care Visit: Palliative care will continue to follow for complex decision making and symptom management. Return in 8 weeks or prn.   Family /Caregiver/Community Supports: Patient's transportation is limited. She would like more information regarding assistance with transportation for appointments. Will include Palliative SW. Palliative Medicine will continue to provide support.    I spent 40 minutes providing this consultation, from 9:00am to 9:40am. Time includes time spent with patient/family, chart review, provider coordination, and documentation. More than 50% of the time in this consultation was spent counseling and coordinating communication.   CHIEF COMPLAINT: Palliative Medicine follow up visit.   History obtained from review of EMR, discussion with primary team. Records reviewed and summarized below.  HISTORY OF PRESENT ILLNESS:  Monique Swanson 64 y.65  year old female with multiple medical problems including  right lung squamous cell carcinoma, COPD, pulmonary fibrosis, depression, IBS, colitis, OA, insomnia. Palliative Care was asked to follow this patient by consultation request of Dr. Sharlet Swanson to help address advance care planning and goals of care. This is a follow up visit.  Patient states she has been doing okay. She does endorse fatigue, shortness of breath with exertion. Denies pain, constipation. She states she has had worsening loose stools x past 3 months. She reports a fair appetite, endorses nausea. She states she is sleeping okay at night; her mood has been "okay" despite some family concerns. She is caring for her two grand children, ages 70 & 56 who recently lost their father and their mother is not involved. She has noticed some discoloration to her nose; she would like to follow up with dermatology. She states her driving is limited.   CODE STATUS: DNR  PPS: 50%  HOSPICE ELIGIBILITY/DIAGNOSIS: TBD  ROS   General: NAD EYES: denies vision changes ENMT: denies dysphagia Cardiovascular: denies chest pain Pulmonary: cough, increased SOB Abdomen: endorses fair appetite, loose stools x 3 months GU: denies dysuria MSK:  no falls reported Skin: denies rashes or wounds Neurological: endorses weakness, sleeping okay Psych: Endorses positive mood Heme/lymph/immuno: denies bruises, abnormal bleeding   Physical Exam: Pulse 80, resp 16, blood pressure 90/60, sats 98% on room air Constitutional: NAD General: frail appearing, thin EYES: anicteric sclera, lids intact, no discharge  ENMT: intact hearing,oral mucous membranes moist CV: RRR, no LE edema Pulmonary: left lobes clear, right lobes slightly diminished, no increased work of breathing Abdomen: bowel sounds normoactive x 4 GU: deferred MSK: ambulatory without assistive device Skin: warm and dry, no rashes or wounds on visible skin Neuro: Generalized weakness Psych: pleasant, non anxious affect today Hem/lymph/immuno: no  widespread bruising   PAST MEDICAL HISTORY:  Past Medical History:  Diagnosis Date  . Anxiety   . CARCINOMA, SQUAMOUS CELL 2009   s/p rescrtion from R nose bridge  . COPD (chronic obstructive pulmonary disease) ([place])   . DEPRESSION   . Dyspnea   . GERD (gastroesophageal reflux disease)   . Headache(784.0)    migraine- in past rare now- (05/24/2019)  . Hx of cardiovascular stress test    Lex MV 6/14:  Normal, EF 80%  . Irritable bowel syndrome   . Lymphocytic colitis   . Microscopic colitis dx 02/2009   chronic diarrhea  . Osteoarthritis    ostroporsis  . OSTEOPENIA   . Osteoporosis, unspecified 09/30/2008   LeB DEXA 05/2012: -2.7, recommended to start bisphos    . PTSD (post-traumatic stress disorder)     SOCIAL HX:  Social History   Tobacco Use  . Smoking status: Current Every Day Smoker    Packs/day: 1.00    Years: 35.00    Pack years: 35.00    Types: Cigarettes  . Smokeless tobacco: Never Used  Substance Use Topics  . Alcohol use: No   FAMILY HX:  Family History  Problem Relation Age of Onset  . Dementia Mother   . Coronary artery disease Mother   . Diabetes Mother   . Asthma Father   . Ovarian cancer Sister   . Diabetes Sister   . Heart disease Sister        x   . Irritable bowel syndrome Sister   . Colon cancer Neg Hx     ALLERGIES:  Allergies  Allergen Reactions  . Tetracycline Other (See Comments)  Ulcers on tongue     PERTINENT MEDICATIONS:  Outpatient Encounter Medications as of 06/25/2020  Medication Sig  . acetaminophen (TYLENOL) 500 MG tablet Take 1,000 mg by mouth every 6 (six) hours as needed for mild pain.   Marland Kitchen albuterol (VENTOLIN HFA) 108 (90 Base) MCG/ACT inhaler TAKE 2 PUFFS BY MOUTH EVERY 6 HOURS AS NEEDED FOR WHEEZE OR SHORTNESS OF BREATH  . ANORO ELLIPTA 62.5-25 MCG/INH AEPB INHALE 1 PUFF BY MOUTH EVERY DAY  . benzonatate (TESSALON) 200 MG capsule Take 1 capsule (200 mg total) by mouth 2 (two) times daily as needed for cough.  .  budesonide (ENTOCORT EC) 3 MG 24 hr capsule Take 3 mg by mouth at bedtime.   . cholecalciferol (VITAMIN D) 1000 UNITS tablet Take 1 tablet (1,000 Units total) by mouth daily. (Patient taking differently: Take 1,000 Units by mouth at bedtime. )  . dicyclomine (BENTYL) 10 MG capsule Take 10 mg by mouth 4 (four) times daily -  before meals and at bedtime.  Marland Kitchen escitalopram (LEXAPRO) 20 MG tablet Take 1 tablet (20 mg total) by mouth daily.  . fluticasone (FLONASE) 50 MCG/ACT nasal spray SPRAY 2 SPRAYS INTO EACH NOSTRIL EVERY DAY  . folic acid (FOLVITE) 1 MG tablet Take 1 tablet (1 mg total) by mouth daily.  Marland Kitchen HYDROcodone-homatropine (HYCODAN) 5-1.5 MG/5ML syrup Take 5 mLs by mouth every 6 (six) hours as needed for cough.  Marland Kitchen imipramine (TOFRANIL) 50 MG tablet Take 1 tablet (50 mg total) by mouth daily.  . mirtazapine (REMERON) 15 MG tablet Take 1 tablet (15 mg total) by mouth at bedtime.  . ondansetron (ZOFRAN) 4 MG tablet TAKE 1 TABLET (4 MG TOTAL) BY MOUTH 3 (THREE) TIMES DAILY AS NEEDED FOR NAUSEA OR VOMITING.  Marland Kitchen zolpidem (AMBIEN) 5 MG tablet Take 1 tablet (5 mg total) by mouth at bedtime as needed for sleep.   No facility-administered encounter medications on file as of 06/25/2020.     Thank you for the opportunity to participate in the care of Ms. Udovich. The palliative care team will continue to follow. Please call our office at [phone] if we can be of additional assistance.  Ezekiel Slocumb, NP

## 2020-08-15 ENCOUNTER — Encounter: Payer: Self-pay | Admitting: Internal Medicine

## 2020-08-15 MED ORDER — HYDROCODONE BIT-HOMATROP MBR 5-1.5 MG/5ML PO SOLN: 5.0000 mL | Freq: Three times a day (TID) | ORAL | 0 refills | Status: DC | PRN

## 2020-08-21 ENCOUNTER — Other Ambulatory Visit: Payer: Self-pay | Admitting: Pulmonary Disease

## 2020-08-21 DIAGNOSIS — R918 Other nonspecific abnormal finding of lung field: Secondary | ICD-10-CM

## 2020-08-27 ENCOUNTER — Other Ambulatory Visit: Payer: Medicare HMO | Admitting: Student

## 2020-08-27 ENCOUNTER — Other Ambulatory Visit: Payer: Self-pay

## 2020-08-27 ENCOUNTER — Other Ambulatory Visit: Payer: Self-pay | Admitting: Student

## 2020-08-27 DIAGNOSIS — Z515 Encounter for palliative care: Secondary | ICD-10-CM

## 2020-08-27 DIAGNOSIS — R52 Pain, unspecified: Secondary | ICD-10-CM | POA: Diagnosis not present

## 2020-08-27 DIAGNOSIS — C3491 Malignant neoplasm of unspecified part of right bronchus or lung: Secondary | ICD-10-CM

## 2020-08-27 DIAGNOSIS — R69 Illness, unspecified: Secondary | ICD-10-CM | POA: Diagnosis not present

## 2020-08-27 DIAGNOSIS — R630 Anorexia: Secondary | ICD-10-CM

## 2020-08-27 DIAGNOSIS — F339 Major depressive disorder, recurrent, unspecified: Secondary | ICD-10-CM

## 2020-08-27 NOTE — Progress Notes (Signed)
  [facility] Palliative Care Consult Note Telephone: [phone]  Fax: [phone]    Date of encounter: 08/27/20 PATIENT NAME: Monique Swanson [address] [place] [address]   [phone] (home)  DOB: 08/24/1955 MRN: [id number] PRIMARY CARE PROVIDER:    Crawford, Elizabeth A, MD,  [address] [place] [address] [phone]  REFERRING PROVIDER:   Crawford, Elizabeth A, MD [address] [place],  [place] [address] [phone]  RESPONSIBLE PARTY:    Contact Information    Name Relation Home Work Mobile   Lanier,Rick Son [phone]        I met face to face with patient in the home. Palliative Care was asked to follow this patient by consultation request of  Crawford, Elizabeth A, * to address advance care planning and complex medical decision making. This is a follow up visit.                                   ASSESSMENT AND PLAN / RECOMMENDATIONS:   Advance Care Planning/Goals of Care: Goals include to maximize quality of life and symptom management. Our advance care planning conversation included a discussion about:     The value and importance of advance care planning   Experiences with loved ones who have been seriously ill or have died   Exploration of personal, cultural or spiritual beliefs that might influence medical decisions   Exploration of goals of care in the event of a sudden injury or illness   CODE STATUS: DNR  Symptom Management/Plan:  Lung Cancer-patient is not seeking any further treatment. She does report fatigue, shortness of breath, worsening pain. Pain-recommend hydrocodone-acetaminophen 5/325mg 1 tab BID PRN, alternating with acetaminophen 500mg BID, education on max daily limit of 3000 mg. E-script sent for norco. She is able to complete adl's, although this is becoming more difficult. She declines need for additional assistance in the home at this time.   Cough/shortness of  breath-Patient with COPD, right lung squamous cell carcinoma. Increased shortness of breath with exertion. Continue inhalers as directed. Hycodan PRN at bedtime, tessalon Perles 200 mg BID PRN.   Depression-patient states her mood has been stable. She is caring for two grand children, who recently lost their father. Continue lexapro and mirtazapine as directed. Will continue to provide supportive care.   Appetite-patient endorses fair appetite. Continue mirtazapine 15mg QHS. Encourage foods she enjoys, adequate fluid intake.    Follow up Palliative Care Visit: Palliative care will continue to follow for complex medical decision making, advance care planning, and clarification of goals. Return in 8 weeks or prn.   This visit was coded based on medical decision making (MDM).  PPS: 50%  HOSPICE ELIGIBILITY/DIAGNOSIS: TBD  Chief Complaint: Palliative Medicine follow up visit; lung cancer.  HISTORY OF PRESENT ILLNESS:  Monique Swanson is a 65 y.o. year old female  with right lung squamous cell carcinoma, COPD, pulmonary fibrosis, depression, IBS, colitis, OA, insomnia.   Patient reports doing okay. She does reports worsening pain between her shoulder blades and chest; constant achy, non-radiating pain. Pain 6/10. She states loose stools have improved; she is taking budesonide once a day. She has a GI f/u on 6/13.   Takes hycodan at bedtime for cough. She is able to complete adl's, but it is becoming more difficult. Sister comes once a week to help out.     History obtained from   review of EMR, discussion with primary team, and interview with family, facility staff/caregiver and/or Ms. Deak.  I reviewed available labs, medications, imaging, studies and related documents from the EMR.  Records reviewed and summarized above.   ROS  General: NAD EYES: denies vision changes ENMT: denies dysphagia Cardiovascular:  DOE Pulmonary: cough Abdomen: endorses fair appetite, denies constipation,  endorses continence of bowel GU: denies dysuria, endorses continence of urine MSK: weakness,  no falls reported Skin: denies rashes or wounds Neurological: sleep is fair Psych: Endorses stable mood Heme/lymph/immuno: denies bruises, abnormal bleeding  Physical Exam: Current weight:  around 115 pounds Pulse 100, resp 20, b/p 102/60, sats 97% on room air Constitutional: NAD General: frail appearing, thin  EYES: anicteric sclera, lids intact, no discharge  ENMT: intact hearing, oral mucous membranes moist CV: S1S2, RRR, no LE edema Pulmonary: LCTA, dyspnea with exertion, non-productive cough Abdomen: normo-active BS + 4 quadrants, soft and non tender GU: deferred MSK: sarcopenia, moves all extremities, ambulatory Skin: warm and dry, no rashes or wounds on visible skin Neuro: generalized weakness, no cognitive impairment Psych: non-anxious affect, A and O x 3 Hem/lymph/immuno: no widespread bruising   Thank you for the opportunity to participate in the care of Ms. Alleman.  The palliative care team will continue to follow. Please call our office at [phone] if we can be of additional assistance.   Ezekiel Slocumb, NP   COVID-19 PATIENT SCREENING TOOL Asked and negative response unless otherwise noted:   Have you had symptoms of covid, tested positive or been in contact with someone with symptoms/positive test in the past 5-10 days? No

## 2020-09-15 ENCOUNTER — Encounter: Payer: Self-pay | Admitting: Internal Medicine

## 2020-09-16 ENCOUNTER — Other Ambulatory Visit: Payer: Self-pay

## 2020-09-16 MED ORDER — ALBUTEROL SULFATE HFA 108 (90 BASE) MCG/ACT IN AERS: INHALATION_SPRAY | RESPIRATORY_TRACT | 5 refills | Status: DC

## 2020-09-17 MED ORDER — HYDROCODONE BIT-HOMATROP MBR 5-1.5 MG/5ML PO SOLN: 5.0000 mL | Freq: Three times a day (TID) | ORAL | 0 refills | Status: DC | PRN

## 2020-09-19 ENCOUNTER — Telehealth: Payer: Self-pay | Admitting: Student

## 2020-09-19 NOTE — Telephone Encounter (Signed)
Palliative NP spoke with patient regarding current pain regimen. She states that she refilled her zolpidem and is sleeping during the night and not waking up in pain. Will continue hydrocodone-acetaminophen BID PRN.

## 2020-09-22 DIAGNOSIS — K52832 Lymphocytic colitis: Secondary | ICD-10-CM | POA: Diagnosis not present

## 2020-09-22 DIAGNOSIS — K219 Gastro-esophageal reflux disease without esophagitis: Secondary | ICD-10-CM | POA: Diagnosis not present

## 2020-10-06 ENCOUNTER — Telehealth: Payer: Self-pay | Admitting: Student

## 2020-10-06 NOTE — Telephone Encounter (Signed)
NP returned call to patient; she is having more pain to her back, hips, knees. Follow up appointment made for tomorrow to discuss worsening disease process, hospice.

## 2020-10-07 ENCOUNTER — Telehealth: Payer: Self-pay | Admitting: Internal Medicine

## 2020-10-07 ENCOUNTER — Other Ambulatory Visit: Payer: Medicare HMO | Admitting: Student

## 2020-10-07 ENCOUNTER — Other Ambulatory Visit: Payer: Self-pay

## 2020-10-07 DIAGNOSIS — Z515 Encounter for palliative care: Secondary | ICD-10-CM

## 2020-10-07 DIAGNOSIS — R630 Anorexia: Secondary | ICD-10-CM

## 2020-10-07 DIAGNOSIS — C3491 Malignant neoplasm of unspecified part of right bronchus or lung: Secondary | ICD-10-CM | POA: Diagnosis not present

## 2020-10-07 DIAGNOSIS — R0602 Shortness of breath: Secondary | ICD-10-CM

## 2020-10-07 DIAGNOSIS — C3492 Malignant neoplasm of unspecified part of left bronchus or lung: Secondary | ICD-10-CM

## 2020-10-07 DIAGNOSIS — R059 Cough, unspecified: Secondary | ICD-10-CM

## 2020-10-07 NOTE — Telephone Encounter (Signed)
Spoke with LaToya to let her know that Dr. Sharlet Salina will be the attending. No other questions or concerns at this time.

## 2020-10-07 NOTE — Telephone Encounter (Signed)
Yes, I will be attending.

## 2020-10-07 NOTE — Telephone Encounter (Signed)
Monique Swanson has called the office on behalf of the pt:  Would Dr.Crawford serve as the attending, pt is under [facility] as of right now but they would like to transfer her to hospice due to her lung cancer?  Monique [phone] ok to lvm

## 2020-10-07 NOTE — Progress Notes (Signed)
[facility] Pall[facility] Consult Note Telephone: [phone]  Fax: [phone]    Date of encounter: 10/07/20 PATIENT NAME: Monique Swanson [id number]   [phone] (home)  DOB: May 19, 1955 MRN: [id number] PRIMARY CARE PROVIDER:    Hoyt Koch, MD,  [place] [address] [phone]  REFERRING PROVIDER:   Hoyt Koch, MD [address] [place],  [place] [address] [phone]  RESPONSIBLE PARTY:    Contact Information     Name Relation Home Work [place] Son [phone]         I met face to face with patient and family in the home. Palliative Care was asked to follow this patient by consultation request of  Hoyt Koch, * to address advance care planning and complex medical decision making. This is a follow up visit.                                   ASSESSMENT AND PLAN / RECOMMENDATIONS:   Advance Care Planning/Goals of Care: Goals include to maximize quality of life and symptom management. Our advance care planning conversation included a discussion about:    The value and importance of advance care planning  Experiences with loved ones who have been seriously ill or have died  Exploration of personal, cultural or spiritual beliefs that might influence medical decisions  Exploration of goals of care in the event of a sudden injury or illness  CODE STATUS: DNR  Discussed Palliative vs. Hospice services. Patient is now open to hospice services. Sister Langley Gauss is present for conversation. Patient will be referred to hospice services.    Symptom Management/Plan:  Squamous cell carcinoma-to bilateral lungs, lymph nodes. Patient is not seeking any aggressive treatment; would like to pursue comfort care. Patient with physical decline worsening symptoms. Patient being referred to Hospice services. Pain-recommend stopping norco; Start morphine  47m/ml take 576m(0.2563mPO/SL every 4 hours PRN pain, shortness of breath, restlessness. Continue acetaminophen 500m40mD, education on max daily limit of 3000 mg. E-script sent morphine.    Cough/shortness of breath-Patient with COPD, squamous cell carcinoma. Increased shortness of breath with exertion, worsening cough. Recommend starting Mucinex 600 mg BID. Continue inhalers as directed. Hycodan PRN at bedtime, tessalon Perles 200 mg BID PRN.    Decline in Appetite-patient endorses poor appetite. Continue mirtazapine 15mg6m. Encourage foods she enjoys, adequate fluid intake.   Follow up Palliative Care Visit: Palliative care will continue to follow for complex medical decision making, advance care planning, and clarification of goals. Return  PRN. Patient being referred to hospice services.  I spent 60 minutes providing this consultation. More than 50% of the time in this consultation was spent in counseling and care coordination.   PPS: 50%, weak  HOSPICE ELIGIBILITY/DIAGNOSIS: right lung squamous cell carcinoma  Chief Complaint: Palliative Medicine follow up visit.  HISTORY OF PRESENT ILLNESS:  Monique Swanson 65 y.65 year old female  with right lung squamous cell carcinoma, ILD, COPD, pulmonary fibrosis, depression, IBS, colitis, OA, insomnia.    Patient resides at home; cares for her two grandsons. Sister visits weekly to help provide care. Patient reports having worsening pain. Now having pain to her upper back, lower back, hips and knees. Pain is a 9/10. Pain varies from dull, achy to sharp pain. She has been taking norco 3  times a day now, with little relief. She reports more difficulty ambulating. Worsening shortness of breath, cough. Denies hemoptysis. Poor appetite; she is eating cornbread and milk. No recent weight loss reported; she was 143 pounds in 2020. Weight today is 115 pounds. She is more tired and fatigued; napping more during the day. Moving bowels okay. She is  unable to complete household chores, family is completing. Shower chair ordered. Grand children are cooking. No longer going out of the house. Patient reports smoking 1 pack of cigarettes per day.   Patient received sitting on back porch. She is more frail, ill appearing. She is having difficulty ambulating, holding on furniture to ambulate. She coughs throughout visit; short of breath with walking approximately 10-15 feet. No recent falls or injury.   CT scan on 01/17/2020 Impression: increased size of bilateral pulmonary nodules and masses, lymph nodes.   History obtained from review of EMR, discussion with primary team, and interview with family, facility staff/caregiver and/or Ms. Graziosi.  I reviewed available labs, medications, imaging, studies and related documents from the EMR.  Records reviewed and summarized above.   ROS  General: NAD EYES: denies vision changes ENMT: denies dysphagia Cardiovascular: denies chest pain, DOE Pulmonary:  cough, increased SOB Abdomen: endorses poor appetite, denies constipation, endorses continence of bowel GU: denies dysuria, endorses continence of urine MSK:  weakness,  no falls reported Skin: denies rashes or wounds Neurological: pain, insomnia Psych: Endorses depressed mood Heme/lymph/immuno: denies bruises, abnormal bleeding  Physical Exam: Current and past weights: 115 pounds Pulse 100, resp 24, bp 98/50, sats 98% on room air Constitutional: NAD General: frail appearing, thin  EYES: anicteric sclera, lids intact, no discharge  ENMT: intact hearing, oral mucous membranes moist CV: S1S2, RRR, no LE edema Pulmonary:  bilateral rhonchi, increased work of breathing, non productive cough  Abdomen: normo-active BS + 4 quadrants, soft and non tender GU: deferred MSK: sarcopenia, moves all extremities, ambulatory Skin: warm and dry, no rashes or wounds on visible skin Neuro:  generalized weakness, A & O x 3 Psych: non-anxious  affect Hem/lymph/immuno: no widespread bruising   Thank you for the opportunity to participate in the care of Monique Swanson.  The palliative care team will continue to follow. Please call our office at [phone] if we can be of additional assistance.   Ezekiel Slocumb, NP   COVID-19 PATIENT SCREENING TOOL Asked and negative response unless otherwise noted:   Have you had symptoms of covid, tested positive or been in contact with someone with symptoms/positive test in the past 5-10 days?  No

## 2020-10-07 NOTE — Telephone Encounter (Signed)
See below

## 2020-10-19 ENCOUNTER — Other Ambulatory Visit: Payer: Self-pay | Admitting: Pulmonary Disease

## 2020-10-30 IMAGING — CR DG CHEST 2V
2 series · 2 of 2 positions shown · non-contrast
Comparison: Earlier the same day

CLINICAL DATA: Status post bronchoscopy. Evaluate for pneumothorax.

EXAM:
CHEST - 2 VIEW

[w chest lat]
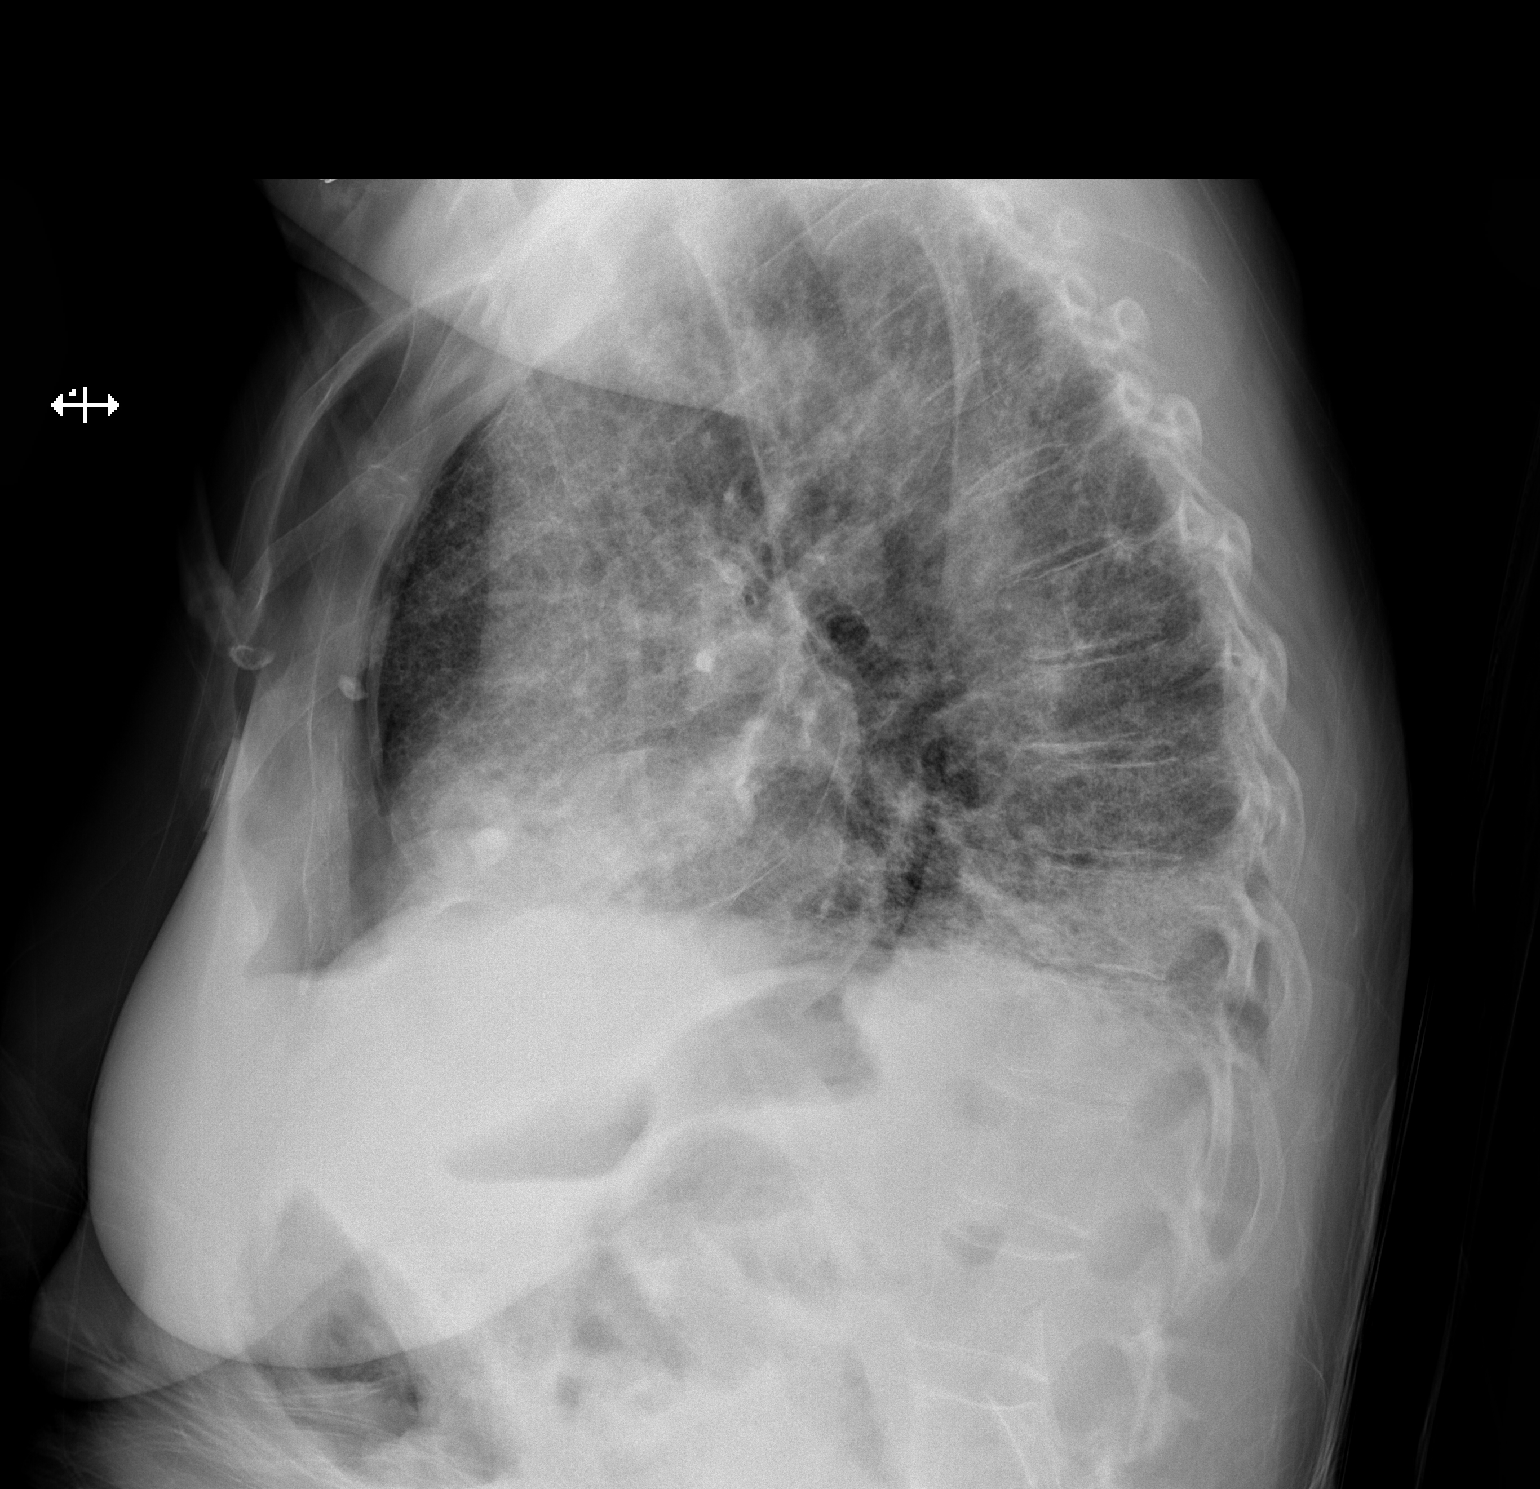

[x chest ap]
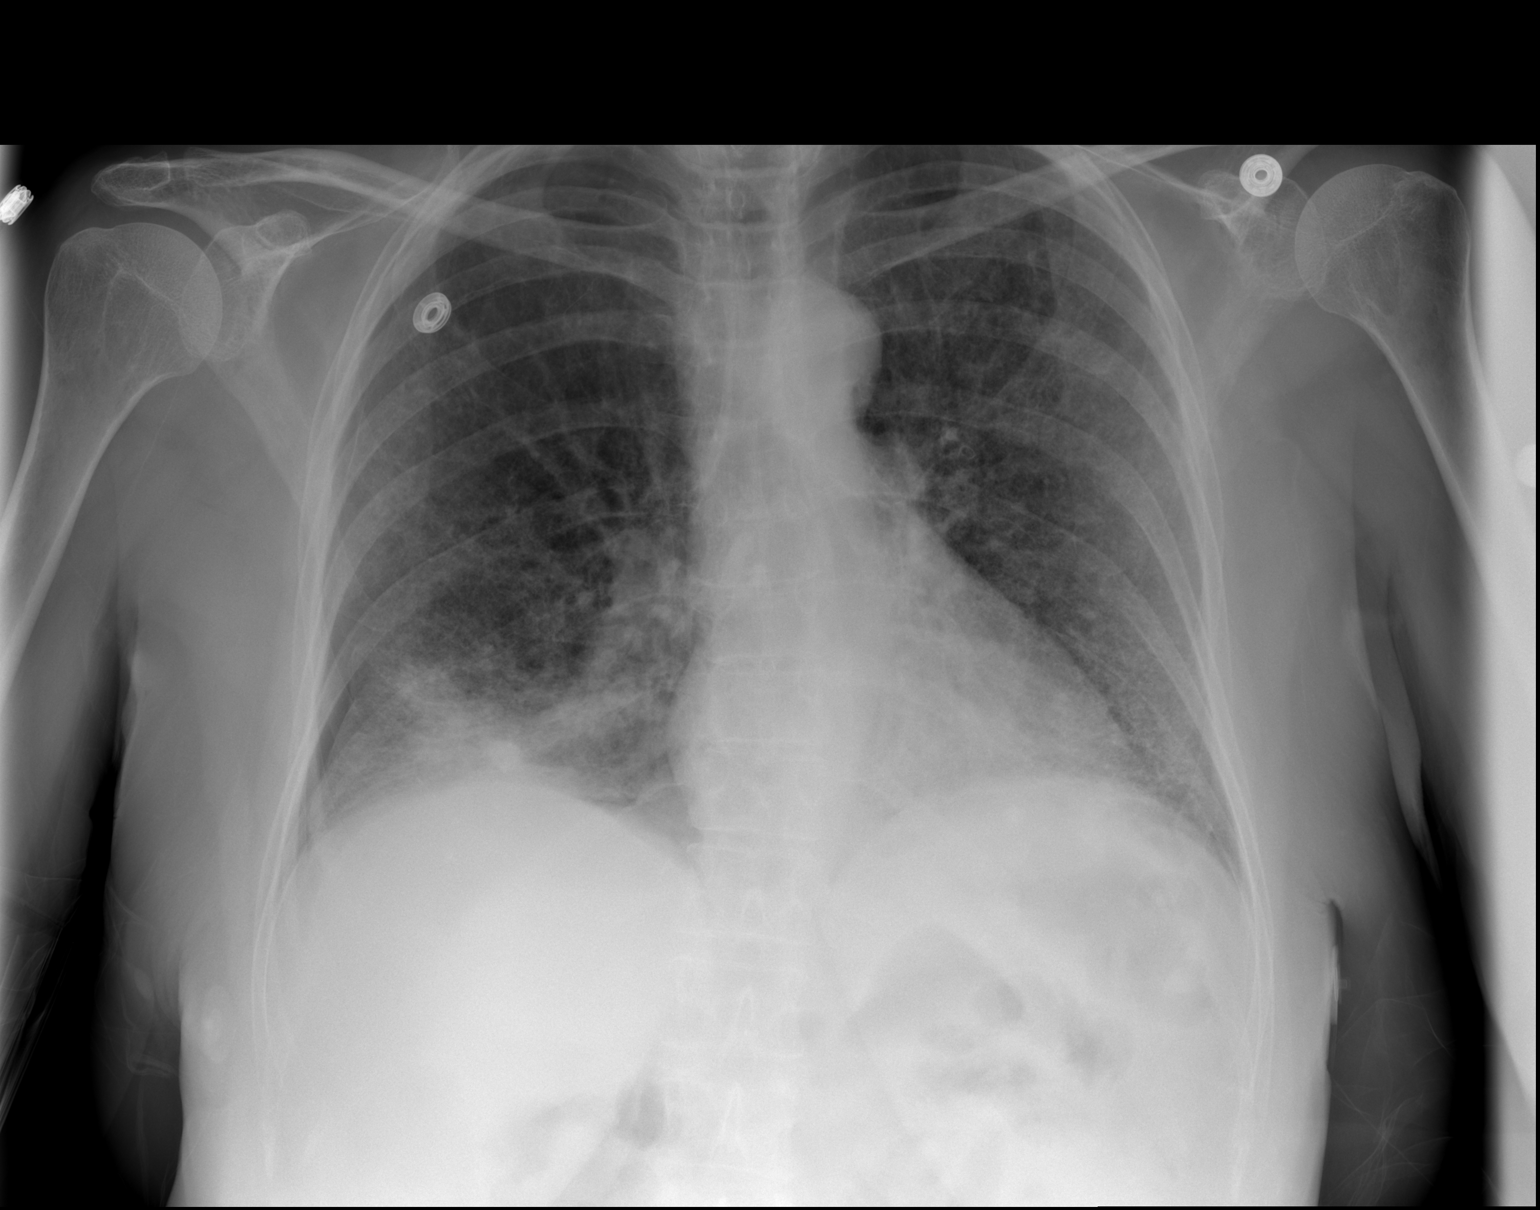

[2 of 2 positions shown; findings below may reference images not displayed]

FINDINGS: There is a small right-sided pneumothorax, similar to prior.
Ill-defined focal opacity at the right base again noted.
Interstitial markings are diffusely coarsened with chronic features.
The cardiopericardial silhouette is within normal limits for size.
IMPRESSION: 1. Small right-sided pneumothorax, similar to prior.
2. Persistent focal opacity at the right base.

## 2020-10-31 ENCOUNTER — Telehealth: Payer: Self-pay | Admitting: Internal Medicine

## 2020-10-31 NOTE — Telephone Encounter (Signed)
Noted  

## 2020-10-31 NOTE — Telephone Encounter (Signed)
Type of form received: FMLA paperwork   Additional comments: this is for the patients son in regards to his mother   Received by: Theodosia Quay  Form should be Faxed to:  Form should be mailed to:    Is patient requesting call for pickup:   Form placed in the Provider's box.  Attach charge sheet.  Provider will determine charge.  Was patient informed of  7-10 business day turn around (Y/N)?

## 2020-12-16 ENCOUNTER — Other Ambulatory Visit: Payer: Self-pay | Admitting: Pulmonary Disease

## 2021-02-20 ENCOUNTER — Encounter (HOSPITAL_COMMUNITY): Payer: Self-pay

## 2021-02-20 ENCOUNTER — Other Ambulatory Visit: Payer: Self-pay

## 2021-02-20 ENCOUNTER — Emergency Department (HOSPITAL_COMMUNITY)
Admission: EM | Admit: 2021-02-20 | Discharge: 2021-02-21 | Disposition: A | Discharge status: Home / Self Care | Attending: Emergency Medicine | Admitting: Emergency Medicine

## 2021-02-20 ENCOUNTER — Emergency Department (HOSPITAL_COMMUNITY)

## 2021-02-20 DIAGNOSIS — J449 Chronic obstructive pulmonary disease, unspecified: Secondary | ICD-10-CM | POA: Insufficient documentation

## 2021-02-20 DIAGNOSIS — Z79899 Other long term (current) drug therapy: Secondary | ICD-10-CM | POA: Diagnosis not present

## 2021-02-20 DIAGNOSIS — R69 Illness, unspecified: Secondary | ICD-10-CM | POA: Diagnosis not present

## 2021-02-20 DIAGNOSIS — C3492 Malignant neoplasm of unspecified part of left bronchus or lung: Secondary | ICD-10-CM | POA: Diagnosis not present

## 2021-02-20 DIAGNOSIS — C3490 Malignant neoplasm of unspecified part of unspecified bronchus or lung: Secondary | ICD-10-CM

## 2021-02-20 DIAGNOSIS — F17210 Nicotine dependence, cigarettes, uncomplicated: Secondary | ICD-10-CM | POA: Insufficient documentation

## 2021-02-20 DIAGNOSIS — I959 Hypotension, unspecified: Secondary | ICD-10-CM | POA: Diagnosis not present

## 2021-02-20 DIAGNOSIS — C3491 Malignant neoplasm of unspecified part of right bronchus or lung: Secondary | ICD-10-CM | POA: Diagnosis not present

## 2021-02-20 DIAGNOSIS — R0689 Other abnormalities of breathing: Secondary | ICD-10-CM | POA: Diagnosis not present

## 2021-02-20 DIAGNOSIS — R0602 Shortness of breath: Secondary | ICD-10-CM | POA: Diagnosis present

## 2021-02-20 DIAGNOSIS — Z743 Need for continuous supervision: Secondary | ICD-10-CM | POA: Diagnosis not present

## 2021-02-20 DIAGNOSIS — R0902 Hypoxemia: Secondary | ICD-10-CM | POA: Diagnosis not present

## 2021-02-20 NOTE — ED Triage Notes (Signed)
Complaining of shortness of breath that is not relieved by her breathing treatments

## 2021-02-21 ENCOUNTER — Encounter (HOSPITAL_COMMUNITY): Payer: Self-pay | Admitting: Emergency Medicine

## 2021-02-21 DIAGNOSIS — C3491 Malignant neoplasm of unspecified part of right bronchus or lung: Secondary | ICD-10-CM | POA: Diagnosis not present

## 2021-02-21 MED ORDER — PREDNISONE 20 MG PO TABS
60.0000 mg | ORAL_TABLET | Freq: Once | ORAL | Status: AC
  Administered 2021-02-21: 60 mg via ORAL
  Filled 2021-02-21: qty 3

## 2021-02-21 MED ORDER — PREDNISONE 20 MG PO TABS: ORAL_TABLET | ORAL | 0 refills | Status: DC

## 2021-02-21 NOTE — ED Provider Notes (Signed)
[facility] DEPT Provider Note   CSN: [id number] Arrival date & time: 02/20/21  2206     History Chief Complaint  Patient presents with   Shortness of Breath    Pt  complaining of shortness of breath that started today after not using her oxygen today, hx of lung ca     Monique Swanson is a 65 y.o. female.  The history is provided by the patient.  Shortness of Breath Severity:  Moderate Onset quality:  Gradual Duration: days. Timing:  Constant Progression:  Worsening Chronicity:  Chronic (has lung cancer and is on hospice) Context: not URI   Relieved by:  Nothing Worsened by:  Nothing Ineffective treatments:  Inhaler Associated symptoms: no chest pain, no claudication, no diaphoresis, no ear pain, no fever, no rash, no sputum production and no syncope   Risk factors: no obesity       Past Medical History:  Diagnosis Date   Anxiety    CARCINOMA, SQUAMOUS CELL 2009   s/p rescrtion from R nose bridge   COPD (chronic obstructive pulmonary disease) (HCC)    DEPRESSION    Dyspnea    GERD (gastroesophageal reflux disease)    Headache(784.0)    migraine- in past rare now- (05/24/2019)   Hx of cardiovascular stress test    Lex MV 6/14:  Normal, EF 80%   Irritable bowel syndrome    Lymphocytic colitis    Microscopic colitis dx 02/2009   chronic diarrhea   Osteoarthritis    ostroporsis   OSTEOPENIA    Osteoporosis, unspecified 09/30/2008   LeB DEXA 05/2012: -2.7, recommended to start bisphos     PTSD (post-traumatic stress disorder)     Patient Active Problem List   Diagnosis Date Noted   Pulmonary nodules/lesions, multiple 05/29/2019   COPD (chronic obstructive pulmonary disease) ([place]) 12/19/2016   Routine general medical examination at a health care facility 02/25/2015   Lymphocytic colitis 07/31/2012   Smokers' cough ([place])    DEFICIENCY OF OTHER VITAMINS 09/30/2008   Osteoporosis, unspecified 09/30/2008   Squamous cell  carcinoma of lungs, bilateral ([place]) 07/16/2008   Major depressive disorder, recurrent episode ([place]) 07/16/2008   ARTHRITIS 07/16/2008   HEADACHE 07/16/2008    Past Surgical History:  Procedure Laterality Date   [place]   BACK SURGERY  09/16/2014   disk replacement with fusion   BREAST BIOPSY  1987   left   BRONCHIAL BRUSHINGS  05/29/2019   Procedure: BRONCHIAL BRUSHINGS;  Surgeon: Collene Gobble, MD;  Location: [facility];  Service: Pulmonary;;   BRONCHIAL NEEDLE ASPIRATION BIOPSY  05/29/2019   Procedure: BRONCHIAL NEEDLE ASPIRATION BIOPSIES;  Surgeon: Collene Gobble, MD;  Location: [facility];  Service: Pulmonary;;  Right middle lobe Left upper lobe   BUNIONECTOMY Right    FOOT SURGERY Bilateral 1990   for joint in toes; bilateral   LUNG BIOPSY  05/29/2019   Procedure: LUNG BIOPSY;  Surgeon: Collene Gobble, MD;  Location: [facility];  Service: Pulmonary;;  right middle lobe biopsy left upper lobe biopsy   TUBAL LIGATION     VIDEO BRONCHOSCOPY  05/29/2019   VIDEO BRONCHOSCOPY WITH ENDOBRONCHIAL NAVIGATION N/A 05/29/2019   Procedure: VIDEO BRONCHOSCOPY WITH ENDOBRONCHIAL NAVIGATION;  Surgeon: Collene Gobble, MD;  Location: [facility];  Service: Pulmonary;  Laterality: N/A;     OB History   No obstetric history on file.     Family History  Problem Relation Age  of Onset   Dementia Mother    Coronary artery disease Mother    Diabetes Mother    Asthma Father    Ovarian cancer Sister    Diabetes Sister    Heart disease Sister        x    Irritable bowel syndrome Sister    Colon cancer Neg Hx     Social History   Tobacco Use   Smoking status: Every Day    Packs/day: 1.00    Years: 35.00    Pack years: 35.00    Types: Cigarettes   Smokeless tobacco: Never  Vaping Use   Vaping Use: Never used  Substance Use Topics   Alcohol use: No   Drug use: No    Home Medications Prior to Admission medications   Medication  Sig Start Date End Date Taking? Authorizing Provider  acetaminophen (TYLENOL) 500 MG tablet Take 1,000 mg by mouth every 6 (six) hours as needed for mild pain.     [provider]  albuterol (VENTOLIN HFA) 108 (90 Base) MCG/ACT inhaler TAKE 2 PUFFS BY MOUTH EVERY 6 HOURS AS NEEDED FOR WHEEZE OR SHORTNESS OF BREATH 09/16/20   Mannam, Praveen, MD  ANORO ELLIPTA 62.5-25 MCG/INH AEPB INHALE 1 PUFF BY MOUTH EVERY DAY 01/05/20   Mannam, Praveen, MD  benzonatate (TESSALON) 200 MG capsule Take 1 capsule (200 mg total) by mouth 2 (two) times daily as needed for cough. 06/08/19   Mannam, Hart Robinsons, MD  budesonide (ENTOCORT EC) 3 MG 24 hr capsule Take 3 mg by mouth at bedtime.     [provider]  cholecalciferol (VITAMIN D) 1000 UNITS tablet Take 1 tablet (1,000 Units total) by mouth daily. Patient not taking: No sig reported 05/18/12   Rowe Clack, MD  dicyclomine (BENTYL) 10 MG capsule Take 10 mg by mouth 4 (four) times daily -  before meals and at bedtime.    [provider]  escitalopram (LEXAPRO) 20 MG tablet Take 1 tablet (20 mg total) by mouth daily. 04/22/20   Hoyt Koch, MD  fluticasone (FLONASE) 50 MCG/ACT nasal spray SPRAY 2 SPRAYS INTO EACH NOSTRIL EVERY DAY 08/22/20   Mannam, Hart Robinsons, MD  folic acid (FOLVITE) 1 MG tablet Take 1 tablet (1 mg total) by mouth daily. Patient not taking: Reported on 06/25/2020 02/13/15   Milus Banister, MD  HYDROcodone bit-homatropine [facility]) 5-1.5 MG/5ML syrup Take 5 mLs by mouth every 8 (eight) hours as needed for cough. 09/17/20   Hoyt Koch, MD  imipramine (TOFRANIL) 50 MG tablet Take 1 tablet (50 mg total) by mouth daily. 04/22/20   Hoyt Koch, MD  mirtazapine (REMERON) 15 MG tablet Take 1 tablet (15 mg total) by mouth at bedtime. 04/22/20   Hoyt Koch, MD  ondansetron (ZOFRAN) 4 MG tablet TAKE 1 TABLET (4 MG TOTAL) BY MOUTH 3 (THREE) TIMES DAILY AS NEEDED FOR NAUSEA OR VOMITING. 10/20/20   Mannam,  Hart Robinsons, MD  zolpidem (AMBIEN) 5 MG tablet Take 1 tablet (5 mg total) by mouth at bedtime as needed for sleep. 02/01/20   Marshell Garfinkel, MD    Allergies    Tetracycline  Review of Systems   Review of Systems  Constitutional:  Negative for diaphoresis and fever.  HENT:  Negative for ear pain.   Eyes:  Negative for redness.  Respiratory:  Positive for shortness of breath. Negative for sputum production.   Cardiovascular:  Negative for chest pain, claudication and syncope.  Genitourinary:  Negative  for difficulty urinating.  Musculoskeletal:  Negative for neck stiffness.  Skin:  Negative for rash.  Neurological:  Negative for facial asymmetry.  Psychiatric/Behavioral:  Negative for agitation.   All other systems reviewed and are negative.  Physical Exam Updated Vital Signs BP 96/62   Pulse 76   Temp 98.2 F (36.8 C) (Oral)   Resp 18   Ht 5' 2"  (1.575 m)   Wt 2.835 kg   SpO2 93%   BMI 1.14 kg/m   Physical Exam Vitals and nursing note reviewed.  Constitutional:      General: She is not in acute distress.    Appearance: Normal appearance.  HENT:     Head: Normocephalic and atraumatic.     Nose: Nose normal.  Eyes:     Conjunctiva/sclera: Conjunctivae normal.     Pupils: Pupils are equal, round, and reactive to light.  Cardiovascular:     Rate and Rhythm: Normal rate and regular rhythm.     Pulses: Normal pulses.     Heart sounds: Normal heart sounds.  Pulmonary:     Effort: Pulmonary effort is normal.     Breath sounds: Normal breath sounds.  Abdominal:     General: Abdomen is flat. Bowel sounds are normal.     Palpations: Abdomen is soft.     Tenderness: There is no abdominal tenderness. There is no guarding.  Musculoskeletal:        General: Normal range of motion.     Cervical back: Normal range of motion and neck supple.     Right lower leg: No edema.     Left lower leg: No edema.  Skin:    General: Skin is warm and dry.     Capillary Refill: Capillary  refill takes less than 2 seconds.  Neurological:     General: No focal deficit present.     Mental Status: She is alert and oriented to person, place, and time.  Psychiatric:        Mood and Affect: Mood normal.        Behavior: Behavior normal.    ED Results / Procedures / Treatments   Labs (all labs ordered are listed, but only abnormal results are displayed) Labs Reviewed - No data to display  EKG None  Radiology DG Chest 2 View  Result Date: 02/20/2021 CLINICAL DATA:  Lung cancer EXAM: CHEST - 2 VIEW COMPARISON:  CT 01/17/2020, radiograph 05/30/2019 FINDINGS: Emphysema. Interval increase in size of bilateral lung masses including cavitary right middle lobe lung mass. Cardiomediastinal silhouette stable. No visible pneumothorax. IMPRESSION: 1. Interval enlargement of multiple bilateral pulmonary masses including cavitary mass in the right middle. 2. Background emphysema. Mild diffuse reticular and ground-glass opacity is presumably due to chronic disease. Electronically Signed   By: Donavan Foil M.D.   On: 02/20/2021 23:55    Procedures Procedures   Medications Ordered in ED Medications  predniSONE (DELTASONE) tablet 60 mg (has no administration in time range)    ED Course  I have reviewed the triage vital signs and the nursing notes.  Pertinent labs & imaging results that were available during my care of the patient were reviewed by me and considered in my medical decision making (see chart for details).    MDM Rules/Calculators/A&P                            Lengthy discussion with family regarding progression of disease.  Goals and wearing  oxygen at all time.    1233 CASE d/w [facility] of hospice.  We discussed patient care at length.  Will start steroids.  Hospice will follow up for goals.  Patient to wear oxygen 24/7  [place] was evaluated in Emergency Department on 02/21/2021 for the symptoms described in the history of present illness. She was  evaluated in the context of the global COVID-19 pandemic, which necessitated consideration that the patient might be at risk for infection with the SARS-CoV-2 virus that causes COVID-19. Institutional protocols and algorithms that pertain to the evaluation of patients at risk for COVID-19 are in a state of rapid change based on information released by regulatory bodies including the CDC and federal and state organizations. These policies and algorithms were followed during the patient's care in the ED.  Final Clinical Impression(s) / ED Diagnoses Final diagnoses:  Malignant neoplasm of lung, unspecified laterality, unspecified part of lung ([place])   Return for intractable cough, coughing up blood, fevers > 100.4 unrelieved by medication, shortness of breath, intractable vomiting, chest pain, shortness of breath, weakness, numbness, changes in speech, facial asymmetry, abdominal pain, passing out, Inability to tolerate liquids or food, cough, altered mental status or any concerns. No signs of systemic illness or infection. The patient is nontoxic-appearing on exam and vital signs are within normal limits.  I have reviewed the triage vital signs and the nursing notes. Pertinent labs & imaging results that were available during my care of the patient were reviewed by me and considered in my medical decision making (see chart for details). After history, exam, and medical workup I feel the patient has been appropriately medically screened and is safe for discharge home. Pertinent diagnoses were discussed with the patient. Patient was given return precautions.      Rx / DC Orders ED Discharge Orders     None        Ben Habermann, MD 02/21/21 [phone]

## 2021-03-26 ENCOUNTER — Telehealth: Payer: Self-pay | Admitting: Internal Medicine

## 2021-03-27 ENCOUNTER — Telehealth: Payer: Self-pay | Admitting: Internal Medicine

## 2021-03-27 NOTE — Telephone Encounter (Signed)
See below

## 2021-03-27 NOTE — Telephone Encounter (Signed)
[facility] requesting provider's signature for death certificate.

## 2021-03-27 NOTE — Telephone Encounter (Signed)
Death certificates are done through [facility]ave

## 2021-04-03 ENCOUNTER — Other Ambulatory Visit: Payer: Self-pay | Admitting: Internal Medicine

## 2021-04-03 DIAGNOSIS — F331 Major depressive disorder, recurrent, moderate: Secondary | ICD-10-CM

## 2021-04-12 NOTE — Telephone Encounter (Signed)
[facility] called   Reported patient passed away at 4:23pm today.

## 2021-04-12 DEATH — deceased
